# Patient Record
Sex: Female | Born: 1950 | Race: White | Hispanic: No | Marital: Married | Smoking: Former smoker
Health system: Southern US, Community
[De-identification: ages and names within clinical notes are randomized; demographics above are authoritative.]

## PROBLEM LIST (undated history)

## (undated) DIAGNOSIS — M8580 Other specified disorders of bone density and structure, unspecified site: Secondary | ICD-10-CM

## (undated) DIAGNOSIS — M1990 Unspecified osteoarthritis, unspecified site: Secondary | ICD-10-CM

## (undated) DIAGNOSIS — E7800 Pure hypercholesterolemia, unspecified: Secondary | ICD-10-CM

## (undated) DIAGNOSIS — G4730 Sleep apnea, unspecified: Secondary | ICD-10-CM

## (undated) DIAGNOSIS — J189 Pneumonia, unspecified organism: Secondary | ICD-10-CM

## (undated) DIAGNOSIS — R4701 Aphasia: Secondary | ICD-10-CM

## (undated) DIAGNOSIS — G5600 Carpal tunnel syndrome, unspecified upper limb: Secondary | ICD-10-CM

## (undated) DIAGNOSIS — T8859XA Other complications of anesthesia, initial encounter: Secondary | ICD-10-CM

## (undated) DIAGNOSIS — M549 Dorsalgia, unspecified: Secondary | ICD-10-CM

## (undated) DIAGNOSIS — E039 Hypothyroidism, unspecified: Secondary | ICD-10-CM

## (undated) DIAGNOSIS — R252 Cramp and spasm: Secondary | ICD-10-CM

## (undated) DIAGNOSIS — F32A Depression, unspecified: Secondary | ICD-10-CM

## (undated) DIAGNOSIS — C801 Malignant (primary) neoplasm, unspecified: Secondary | ICD-10-CM

## (undated) DIAGNOSIS — K5190 Ulcerative colitis, unspecified, without complications: Secondary | ICD-10-CM

## (undated) DIAGNOSIS — G8929 Other chronic pain: Secondary | ICD-10-CM

## (undated) DIAGNOSIS — T4145XA Adverse effect of unspecified anesthetic, initial encounter: Secondary | ICD-10-CM

## (undated) HISTORY — PX: JOINT REPLACEMENT: SHX530

## (undated) HISTORY — PX: COLONOSCOPY W/ BIOPSIES AND POLYPECTOMY: SHX1376

## (undated) HISTORY — PX: TRIGGER FINGER RELEASE: SHX641

## (undated) HISTORY — DX: Other specified disorders of bone density and structure, unspecified site: M85.80

## (undated) HISTORY — DX: Cramp and spasm: R25.2

## (undated) HISTORY — DX: Sleep apnea, unspecified: G47.30

## (undated) HISTORY — PX: CHOLECYSTECTOMY: SHX55

## (undated) HISTORY — DX: Depression, unspecified: F32.A

## (undated) HISTORY — PX: BILATERAL CARPAL TUNNEL RELEASE: SHX6508

## (undated) HISTORY — PX: BREAST SURGERY: SHX581

---

## 1999-05-07 ENCOUNTER — Ambulatory Visit (HOSPITAL_COMMUNITY): Admission: RE | Admit: 1999-05-07 | Discharge: 1999-05-07 | Payer: Self-pay | Admitting: Gastroenterology

## 1999-05-07 ENCOUNTER — Encounter (INDEPENDENT_AMBULATORY_CARE_PROVIDER_SITE_OTHER): Payer: Self-pay | Admitting: Specialist

## 2000-02-29 ENCOUNTER — Other Ambulatory Visit: Admission: RE | Admit: 2000-02-29 | Discharge: 2000-02-29 | Payer: Self-pay | Admitting: Obstetrics and Gynecology

## 2000-02-29 ENCOUNTER — Encounter (INDEPENDENT_AMBULATORY_CARE_PROVIDER_SITE_OTHER): Payer: Self-pay

## 2002-12-07 ENCOUNTER — Encounter (INDEPENDENT_AMBULATORY_CARE_PROVIDER_SITE_OTHER): Payer: Self-pay | Admitting: Specialist

## 2002-12-07 ENCOUNTER — Ambulatory Visit (HOSPITAL_COMMUNITY): Admission: RE | Admit: 2002-12-07 | Discharge: 2002-12-07 | Payer: Self-pay | Admitting: Gastroenterology

## 2007-10-26 ENCOUNTER — Encounter
Admission: RE | Admit: 2007-10-26 | Discharge: 2007-10-26 | Payer: Self-pay | Admitting: Physical Medicine and Rehabilitation

## 2008-07-18 ENCOUNTER — Encounter: Admission: RE | Admit: 2008-07-18 | Discharge: 2008-07-18 | Payer: Self-pay | Admitting: Orthopedic Surgery

## 2008-10-30 ENCOUNTER — Encounter: Admission: RE | Admit: 2008-10-30 | Discharge: 2008-10-30 | Payer: Self-pay | Admitting: Orthopedic Surgery

## 2009-02-15 HISTORY — PX: BREAST LUMPECTOMY: SHX2

## 2009-02-15 HISTORY — PX: BREAST BIOPSY: SHX20

## 2009-03-13 ENCOUNTER — Inpatient Hospital Stay (HOSPITAL_COMMUNITY): Admission: RE | Admit: 2009-03-13 | Discharge: 2009-03-16 | Payer: Self-pay | Admitting: Orthopedic Surgery

## 2010-05-04 LAB — COMPREHENSIVE METABOLIC PANEL
Alkaline Phosphatase: 46 U/L (ref 39–117)
BUN: 12 mg/dL (ref 6–23)
CO2: 30 mEq/L (ref 19–32)
GFR calc Af Amer: >60 mL/min (ref >60)
GFR calc non Af Amer: >60 mL/min (ref >60)
Glucose, Bld: 58 mg/dL — ABNORMAL LOW (ref 70–99)
Potassium: 4.6 mEq/L (ref 3.5–5.1)
Total Protein: 7 g/dL (ref 6.0–8.3)

## 2010-05-04 LAB — BASIC METABOLIC PANEL
CO2: 28 mEq/L (ref 19–32)
Calcium: 8.5 mg/dL (ref 8.4–10.5)
Chloride: 106 mEq/L (ref 96–112)
Chloride: 107 mEq/L (ref 96–112)
Creatinine, Ser: 0.75 mg/dL (ref 0.4–1.2)
Creatinine, Ser: 0.76 mg/dL (ref 0.4–1.2)
GFR calc Af Amer: >60 mL/min (ref >60)
GFR calc Af Amer: >60 mL/min (ref >60)
Potassium: 4.2 mEq/L (ref 3.5–5.1)
Potassium: 4.6 mEq/L (ref 3.5–5.1)
Sodium: 139 mEq/L (ref 135–145)

## 2010-05-04 LAB — CBC
HCT: 24.8 % — ABNORMAL LOW (ref 36.0–46.0)
HCT: 31.8 % — ABNORMAL LOW (ref 36.0–46.0)
HCT: 32.2 % — ABNORMAL LOW (ref 36.0–46.0)
HCT: 38.4 % (ref 36.0–46.0)
Hemoglobin: 10.6 g/dL — ABNORMAL LOW (ref 12.0–15.0)
Hemoglobin: 12.7 g/dL (ref 12.0–15.0)
Hemoglobin: 8 g/dL — ABNORMAL LOW (ref 12.0–15.0)
MCHC: 33.1 g/dL (ref 30.0–36.0)
MCV: 98.5 fL (ref 78.0–100.0)
Platelets: 147 10*3/uL — ABNORMAL LOW (ref 150–400)
Platelets: 166 10*3/uL (ref 150–400)
RBC: 3.27 MIL/uL — ABNORMAL LOW (ref 3.87–5.11)
RBC: 3.76 MIL/uL — ABNORMAL LOW (ref 3.87–5.11)
RDW: 13 % (ref 11.5–15.5)
RDW: 13.1 % (ref 11.5–15.5)
RDW: 14.5 % (ref 11.5–15.5)
WBC: 4.6 10*3/uL (ref 4.0–10.5)
WBC: 5.6 10*3/uL (ref 4.0–10.5)
WBC: 6.4 10*3/uL (ref 4.0–10.5)

## 2010-05-04 LAB — URINALYSIS, ROUTINE W REFLEX MICROSCOPIC
Bilirubin Urine: NEGATIVE
Hgb urine dipstick: NEGATIVE
pH: 6.5 (ref 5.0–8.0)

## 2010-05-04 LAB — DIFFERENTIAL
Basophils Absolute: 0 10*3/uL (ref 0.0–0.1)
Basophils Relative: 1 % (ref 0–1)
Eosinophils Absolute: 0.1 10*3/uL (ref 0.0–0.7)
Lymphocytes Relative: 44 % (ref 12–46)
Monocytes Absolute: 0.3 10*3/uL (ref 0.1–1.0)
Monocytes Relative: 9 % (ref 3–12)
Neutro Abs: 1.4 10*3/uL — ABNORMAL LOW (ref 1.7–7.7)
Neutrophils Relative %: 44 % (ref 43–77)

## 2010-05-04 LAB — CROSSMATCH

## 2010-05-04 LAB — PROTIME-INR: Prothrombin Time: 13.2 seconds (ref 11.6–15.2)

## 2010-07-03 NOTE — Op Note (Signed)
   NAME:  Stephanie Ramsey, Stephanie Ramsey                             ACCOUNT NO.:  [id number]   MEDICAL RECORD NO.:  [id number]                   PATIENT TYPE:  AMB   LOCATION:  ENDO                                 FACILITY:  [facility]   PHYSICIAN:  Earle Gell, M.D.                DATE OF BIRTH:  12-Feb-1951   DATE OF PROCEDURE:  12/07/2002  DATE OF DISCHARGE:                                 OPERATIVE REPORT   PROCEDURE:  Surveillance colonoscopy.   PROCEDURE INDICATION:  [name] is a 60 year old female born 1950/08/20.  [name] was diagnosed with left-sided ulcerative colitis at age  58.  She is scheduled for screening colonoscopy with polypectomy and random  colonic biopsies to rule out mucosal dysplasia.  Symptomatically her colitis  is in remission.   ENDOSCOPIST:  Earle Gell, M.D.   PREMEDICATION:  Versed 5 mg, Demerol 50 mg.   DESCRIPTION OF PROCEDURE:  After obtaining informed consent, [name] was  placed in the left lateral decubitus position.  I administered intravenous  Demerol and intravenous Versed to achieve conscious sedation for the  procedure.  The patient's blood pressure, oxygen saturation, and cardiac  rhythm were monitored throughout the procedure and documented in the medical  record.   Anal inspection was normal.  Digital rectal exam was normal.  The Olympus  pediatric colonoscope was introduced into the rectum and advanced to the  cecum.  Colonic preparation for the exam today was excellent.   Rectum normal.   Sigmoid colon and descending colon normal.   Splenic flexure normal.   Transverse colon normal.   Hepatic flexure normal.   Ascending colon normal.   Cecum and ileocecal valve normal.   Biopsies:  Four-quadrant colonic mucosal biopsies were taken every 10 cm  beginning in the cecum.  Eight biopsies were placed in each bottle.  Four  bottles were submitted for pathologic evaluation.  Thirty-two biopsies were  performed.   ASSESSMENT:   Normal proctocolonoscopy to the cecum.  Random colonic biopsies  are pending.                                               Earle Gell, M.D.    MJ/MEDQ  D:  12/07/2002  T:  12/07/2002  Job:  [id number]

## 2011-01-18 IMAGING — CR DG HIP 1V PORT*R*
2 series · 2 of 2 positions shown · non-contrast
Comparison: MRI 10/26/2007

CLINICAL DATA: Right hip replacement.

PORTABLE RIGHT HIP - 1 VIEW

[view not recorded (1 of 2)]
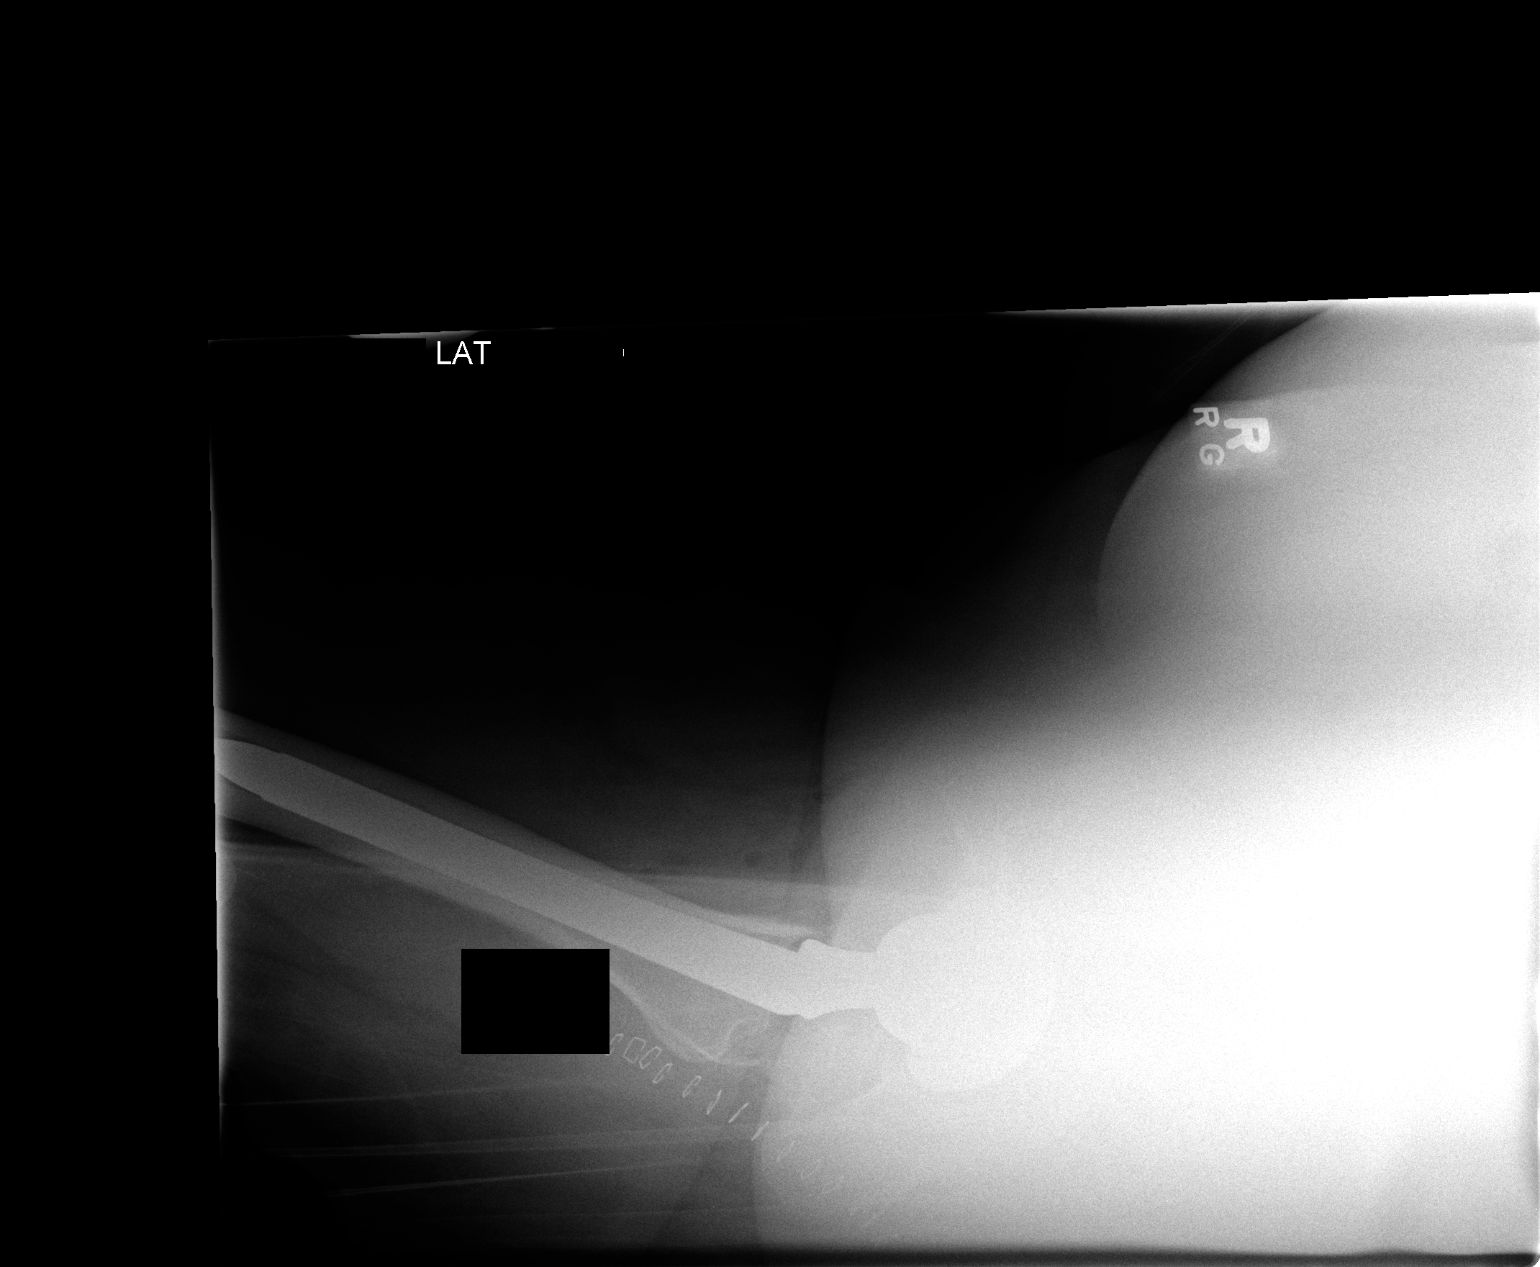

[view not recorded (2 of 2)]
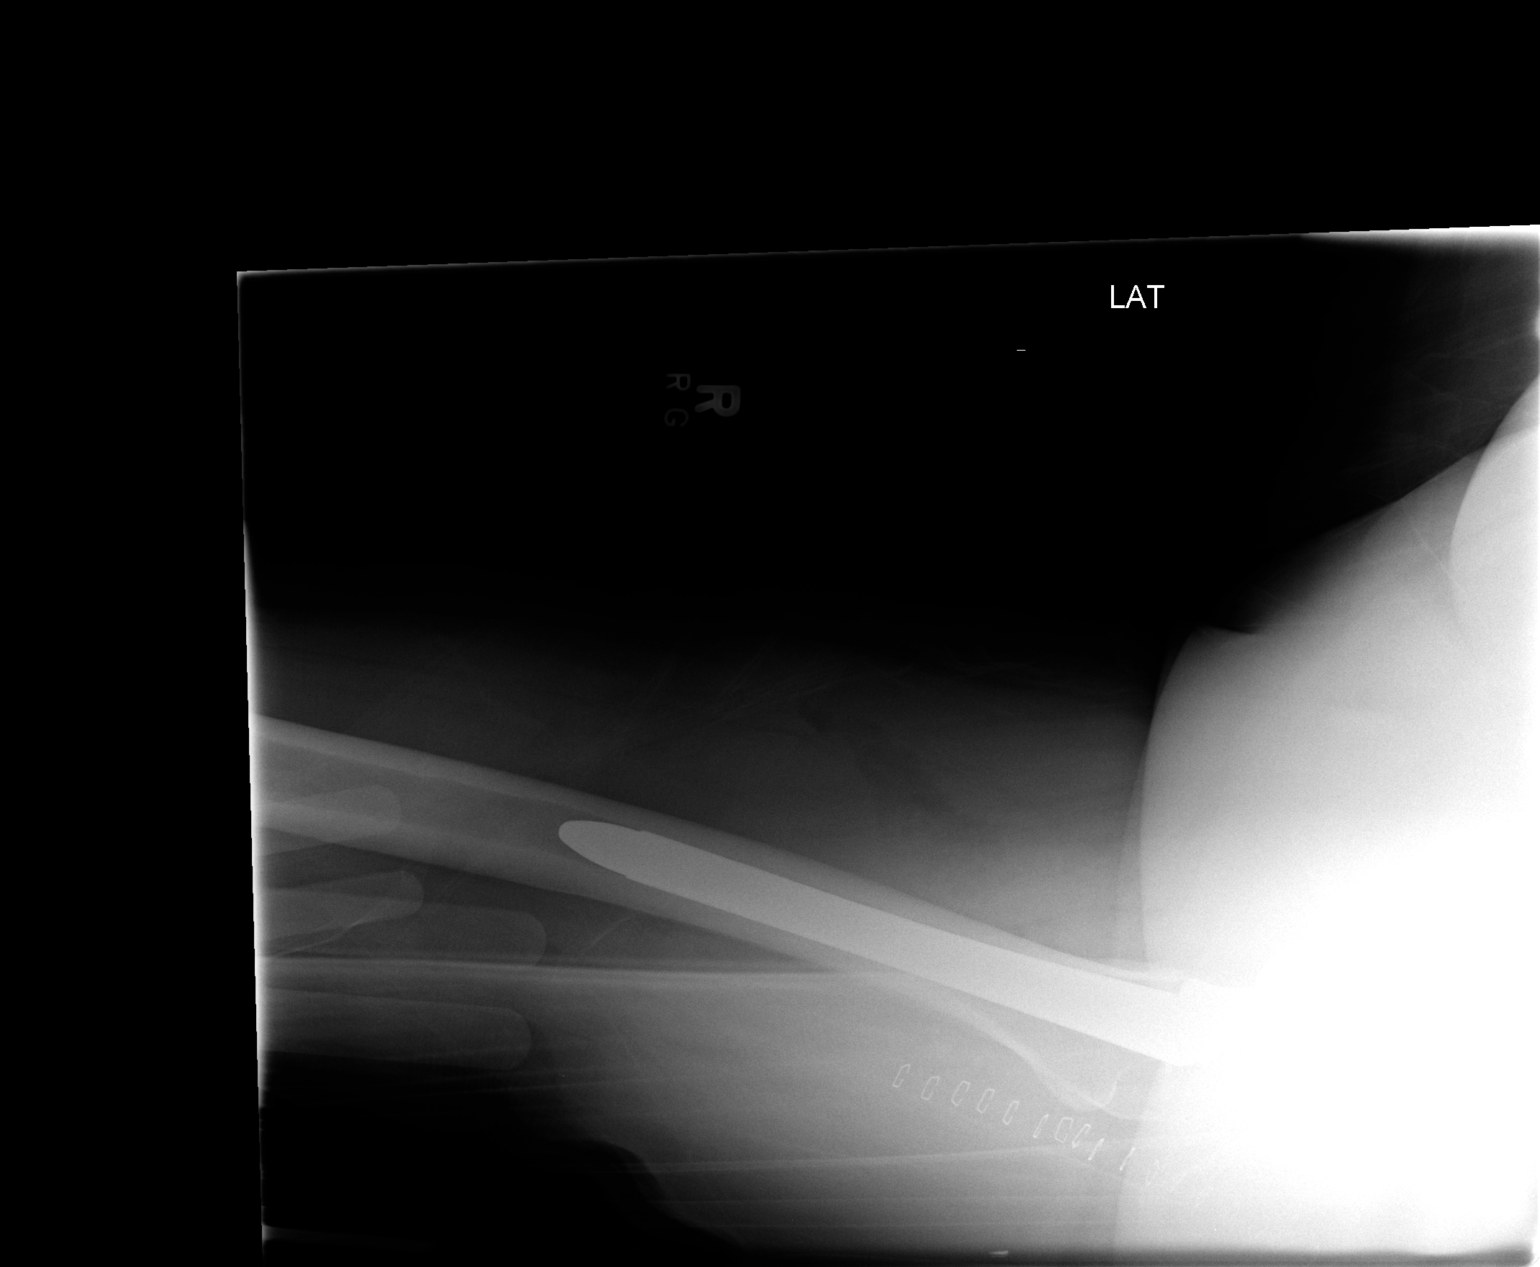

[2 of 2 positions shown; findings below may reference images not displayed]

FINDINGS: Changes of right hip replacement noted.  Normal
alignment.  No complicating feature.
IMPRESSION: Right hip replacement without complicating feature.

## 2011-10-06 ENCOUNTER — Other Ambulatory Visit: Payer: Self-pay | Admitting: Orthopaedic Surgery

## 2011-10-06 DIAGNOSIS — M545 Low back pain, unspecified: Secondary | ICD-10-CM

## 2011-11-06 ENCOUNTER — Ambulatory Visit
Admission: RE | Admit: 2011-11-06 | Discharge: 2011-11-06 | Disposition: A | Discharge status: Home / Self Care | Payer: BC Managed Care – PPO | Source: Ambulatory Visit | Attending: Orthopaedic Surgery | Admitting: Orthopaedic Surgery

## 2011-11-06 DIAGNOSIS — M545 Low back pain, unspecified: Secondary | ICD-10-CM

## 2011-12-15 ENCOUNTER — Ambulatory Visit: Payer: BC Managed Care – PPO | Attending: Orthopaedic Surgery | Admitting: Rehabilitation

## 2011-12-15 DIAGNOSIS — M549 Dorsalgia, unspecified: Secondary | ICD-10-CM | POA: Insufficient documentation

## 2011-12-15 DIAGNOSIS — IMO0001 Reserved for inherently not codable concepts without codable children: Secondary | ICD-10-CM | POA: Insufficient documentation

## 2011-12-22 ENCOUNTER — Ambulatory Visit: Payer: BC Managed Care – PPO | Attending: Orthopaedic Surgery | Admitting: Rehabilitation

## 2011-12-22 DIAGNOSIS — IMO0001 Reserved for inherently not codable concepts without codable children: Secondary | ICD-10-CM | POA: Insufficient documentation

## 2011-12-22 DIAGNOSIS — M549 Dorsalgia, unspecified: Secondary | ICD-10-CM | POA: Insufficient documentation

## 2011-12-29 ENCOUNTER — Encounter: Payer: BC Managed Care – PPO | Admitting: Rehabilitation

## 2011-12-30 ENCOUNTER — Encounter: Payer: BC Managed Care – PPO | Admitting: Rehabilitation

## 2012-01-05 ENCOUNTER — Ambulatory Visit: Payer: BC Managed Care – PPO | Admitting: Rehabilitation

## 2012-01-12 ENCOUNTER — Ambulatory Visit: Payer: BC Managed Care – PPO | Admitting: Rehabilitation

## 2013-07-10 ENCOUNTER — Other Ambulatory Visit (HOSPITAL_COMMUNITY): Payer: Self-pay | Admitting: Orthopaedic Surgery

## 2013-07-10 DIAGNOSIS — R52 Pain, unspecified: Secondary | ICD-10-CM

## 2013-07-17 ENCOUNTER — Ambulatory Visit (HOSPITAL_COMMUNITY): Admission: RE | Admit: 2013-07-17 | Payer: BC Managed Care – PPO | Source: Ambulatory Visit

## 2013-12-17 ENCOUNTER — Encounter (HOSPITAL_COMMUNITY): Payer: Self-pay

## 2013-12-21 ENCOUNTER — Encounter (HOSPITAL_COMMUNITY): Payer: Self-pay

## 2013-12-21 ENCOUNTER — Encounter (HOSPITAL_COMMUNITY)
Admission: RE | Admit: 2013-12-21 | Discharge: 2013-12-21 | Disposition: A | Discharge status: Home / Self Care | Payer: BC Managed Care – PPO | Source: Ambulatory Visit | Attending: Orthopaedic Surgery | Admitting: Orthopaedic Surgery

## 2013-12-21 ENCOUNTER — Ambulatory Visit (HOSPITAL_COMMUNITY)
Admission: RE | Admit: 2013-12-21 | Discharge: 2013-12-21 | Disposition: A | Discharge status: Home / Self Care | Payer: BC Managed Care – PPO | Source: Ambulatory Visit | Attending: Orthopedic Surgery | Admitting: Orthopedic Surgery

## 2013-12-21 DIAGNOSIS — Z01811 Encounter for preprocedural respiratory examination: Secondary | ICD-10-CM | POA: Insufficient documentation

## 2013-12-21 DIAGNOSIS — Z01812 Encounter for preprocedural laboratory examination: Secondary | ICD-10-CM | POA: Diagnosis present

## 2013-12-21 DIAGNOSIS — Z87891 Personal history of nicotine dependence: Secondary | ICD-10-CM | POA: Diagnosis not present

## 2013-12-21 DIAGNOSIS — Z853 Personal history of malignant neoplasm of breast: Secondary | ICD-10-CM | POA: Insufficient documentation

## 2013-12-21 HISTORY — DX: Malignant (primary) neoplasm, unspecified: C80.1

## 2013-12-21 HISTORY — DX: Adverse effect of unspecified anesthetic, initial encounter: T41.45XA

## 2013-12-21 HISTORY — DX: Other complications of anesthesia, initial encounter: T88.59XA

## 2013-12-21 HISTORY — DX: Dorsalgia, unspecified: M54.9

## 2013-12-21 HISTORY — DX: Other chronic pain: G89.29

## 2013-12-21 HISTORY — DX: Pure hypercholesterolemia, unspecified: E78.00

## 2013-12-21 HISTORY — DX: Unspecified osteoarthritis, unspecified site: M19.90

## 2013-12-21 HISTORY — DX: Hypothyroidism, unspecified: E03.9

## 2013-12-21 HISTORY — DX: Ulcerative colitis, unspecified, without complications: K51.90

## 2013-12-21 HISTORY — DX: Carpal tunnel syndrome, unspecified upper limb: G56.00

## 2013-12-21 HISTORY — DX: Pneumonia, unspecified organism: J18.9

## 2013-12-21 LAB — CBC WITH DIFFERENTIAL/PLATELET
BASOS ABS: 0 10*3/uL (ref 0.0–0.1)
Basophils Relative: 1 % (ref 0–1)
EOS ABS: 0 10*3/uL (ref 0.0–0.7)
EOS PCT: 1 % (ref 0–5)
HCT: 35.8 % — ABNORMAL LOW (ref 36.0–46.0)
Hemoglobin: 11.8 g/dL — ABNORMAL LOW (ref 12.0–15.0)
LYMPHS ABS: 2.4 10*3/uL (ref 0.7–4.0)
Lymphocytes Relative: 37 % (ref 12–46)
MCH: 32.8 pg (ref 26.0–34.0)
MCHC: 33 g/dL (ref 30.0–36.0)
MCV: 99.4 fL (ref 78.0–100.0)
Monocytes Absolute: 0.6 10*3/uL (ref 0.1–1.0)
Monocytes Relative: 10 % (ref 3–12)
Neutro Abs: 3.3 10*3/uL (ref 1.7–7.7)
Neutrophils Relative %: 51 % (ref 43–77)
Platelets: 270 10*3/uL (ref 150–400)
RBC: 3.6 MIL/uL — ABNORMAL LOW (ref 3.87–5.11)
RDW: 13.1 % (ref 11.5–15.5)
WBC: 6.4 10*3/uL (ref 4.0–10.5)

## 2013-12-21 LAB — URINALYSIS, ROUTINE W REFLEX MICROSCOPIC
BILIRUBIN URINE: NEGATIVE
GLUCOSE, UA: NEGATIVE mg/dL
Hgb urine dipstick: NEGATIVE
Ketones, ur: NEGATIVE mg/dL
LEUKOCYTES UA: NEGATIVE
Nitrite: NEGATIVE
PH: 6 (ref 5.0–8.0)
Protein, ur: NEGATIVE mg/dL
SPECIFIC GRAVITY, URINE: 1.014 (ref 1.005–1.030)
Urobilinogen, UA: 0.2 mg/dL (ref 0.0–1.0)

## 2013-12-21 LAB — SURGICAL PCR SCREEN
MRSA, PCR: NEGATIVE
Staphylococcus aureus: NEGATIVE

## 2013-12-21 LAB — APTT: aPTT: 32 seconds (ref 24–37)

## 2013-12-21 LAB — COMPREHENSIVE METABOLIC PANEL
ALT: 57 U/L — ABNORMAL HIGH (ref 0–35)
AST: 34 U/L (ref 0–37)
Albumin: 3.6 g/dL (ref 3.5–5.2)
Alkaline Phosphatase: 81 U/L (ref 39–117)
Anion gap: 12 (ref 5–15)
BUN: 12 mg/dL (ref 6–23)
CALCIUM: 9.7 mg/dL (ref 8.4–10.5)
CO2: 30 meq/L (ref 19–32)
CREATININE: 0.66 mg/dL (ref 0.50–1.10)
Chloride: 100 mEq/L (ref 96–112)
GLUCOSE: 100 mg/dL — AB (ref 70–99)
Potassium: 3.4 mEq/L — ABNORMAL LOW (ref 3.7–5.3)
Sodium: 142 mEq/L (ref 137–147)
Total Bilirubin: 0.3 mg/dL (ref 0.3–1.2)
Total Protein: 7.1 g/dL (ref 6.0–8.3)

## 2013-12-21 LAB — TYPE AND SCREEN
ABO/RH(D): A POS
Antibody Screen: NEGATIVE

## 2013-12-21 LAB — PROTIME-INR
INR: 1.03 (ref 0.00–1.49)
PROTHROMBIN TIME: 13.6 s (ref 11.6–15.2)

## 2013-12-21 LAB — ABO/RH: ABO/RH(D): A POS

## 2013-12-21 NOTE — Pre-Procedure Instructions (Signed)
Stephanie Ramsey  12/21/2013   Your procedure is scheduled on: Tuesday, January 01, 2014  Report to [facility] Admitting at 5:30 AM.  Call this number if you have problems the morning of surgery: [phone]   Remember:   Do not eat food or drink liquids after midnight Monday, December 31, 2013   Take these medicines the morning of surgery with A SIP OF WATER: levothyroxine (SYNTHROID),  If needed: HYDROcodone-acetaminophen (VICODIN)  for pain.  Stop taking Aspirin, vitamins, and herbal medications such as  (Cinnamon, GLUCOSAMINE-CHONDROITIN, Omega-3 Fatty Acids (FISH OIL), TURMERIC, and  Ubiquinol. Do not take any NSAIDs ie: Ibuprofen, Advil, Naproxen or any medication containing Aspirin ;stop 5 days prior to procedure (Thursday, December 27, 2013 )   Do not wear jewelry, make-up or nail polish.  Do not wear lotions, powders, or perfumes. You may not wear deodorant.  Do not shave 48 hours prior to surgery.   Do not bring valuables to the hospital.  [facility] is not responsible for any belongings or valuables.               Contacts, dentures or bridgework may not be worn into surgery.  Leave suitcase in the car. After surgery it may be brought to your room.  For patients admitted to the hospital, discharge time is determined by your treatment team.               Patients discharged the day of surgery will not be allowed to drive home.  Name and phone number of your driver:   Special Instructions:  Special Instructions:Special Instructions: [facility] - Preparing for Surgery  Before surgery, you can play an important role.  Because skin is not sterile, your skin needs to be as free of germs as possible.  You can reduce the number of germs on you skin by washing with CHG (chlorahexidine gluconate) soap before surgery.  CHG is an antiseptic cleaner which kills germs and bonds with the skin to continue killing germs even after washing.  Please DO NOT use if you have an allergy  to CHG or antibacterial soaps.  If your skin becomes reddened/irritated stop using the CHG and inform your nurse when you arrive at Short Stay.  Do not shave (including legs and underarms) for at least 48 hours prior to the first CHG shower.  You may shave your face.  Please follow these instructions carefully:   1.  Shower with CHG Soap the night before surgery and the morning of Surgery.  2.  If you choose to wash your hair, wash your hair first as usual with your normal shampoo.  3.  After you shampoo, rinse your hair and body thoroughly to remove the Shampoo.  4.  Use CHG as you would any other liquid soap.  You can apply chg directly  to the skin and wash gently with scrungie or a clean washcloth.  5.  Apply the CHG Soap to your body ONLY FROM THE NECK DOWN.  Do not use on open wounds or open sores.  Avoid contact with your eyes, ears, mouth and genitals (private parts).  Wash genitals (private parts) with your normal soap.  6.  Wash thoroughly, paying special attention to the area where your surgery will be performed.  7.  Thoroughly rinse your body with warm water from the neck down.  8.  DO NOT shower/wash with your normal soap after using and rinsing off the CHG Soap.  9.  Pat yourself dry with a clean towel.            10.  Wear clean pajamas.            11.  Place clean sheets on your bed the night of your first shower and do not sleep with pets.  Day of Surgery  Do not apply any lotions/deodorants the morning of surgery.  Please wear clean clothes to the hospital/surgery center.   Please read over the following fact sheets that you were given: Pain Booklet, Coughing and Deep Breathing, Blood Transfusion Information, Total Joint Packet, MRSA Information and Surgical Site Infection Prevention

## 2013-12-24 LAB — URINE CULTURE
COLONY COUNT: NO GROWTH
CULTURE: NO GROWTH

## 2013-12-24 NOTE — Progress Notes (Signed)
Anesthesia Chart Review:  Pt is 63 year old female scheduled for L total hip arthroplasty on 01/01/2014 with Dr. Durward Fortes.   PMH: hypercholesterolemia, hypothyroidism, ulcerative colitis, breast cancer. Former smoker. Pt has hx of anesthesia complication of stopping breathing during procedure and dizziness after procedures.   Medications include: ASA, simvastatin, levothyroxine  Preoperative labs reviewed.  K 3.4.  ALT 57.  EKG from PCP's office 11/27/2013 is on chart. Showed NSR.   Is medically cleared by pcp, Dr. Charlcie Cradle with [facility]. See note on chart.   Pt had R hip replacement 03/13/2009 at [facility] request for records. Assigned anesthesiologist can review DOS.   Willeen Cass, FNP-BC [facility]/Anesthesiology Phone: [phone] 12/24/2013 2:56 PM

## 2013-12-31 MED ORDER — ACETAMINOPHEN 10 MG/ML IV SOLN
1000.0000 mg | Freq: Four times a day (QID) | INTRAVENOUS | Status: DC
  Administered 2014-01-01: 1000 mg via INTRAVENOUS

## 2013-12-31 MED ORDER — CEFAZOLIN SODIUM-DEXTROSE 2-3 GM-% IV SOLR
2.0000 g | INTRAVENOUS | Status: AC
  Administered 2014-01-01: 2 g via INTRAVENOUS
  Filled 2013-12-31: qty 50

## 2013-12-31 NOTE — Anesthesia Preprocedure Evaluation (Addendum)
Anesthesia Evaluation  Patient identified by MRN, date of birth, ID band Patient awake    Reviewed: Allergy & Precautions, H&P , NPO status , Patient's Chart, lab work & pertinent test results, reviewed documented beta blocker date and time   Airway Mallampati: II   Neck ROM: Full    Dental  (+) Teeth Intact, Dental Advisory Given   Pulmonary former smoker,  breath sounds clear to auscultation        Cardiovascular Rhythm:Irregular  EKG NSR   Neuro/Psych    GI/Hepatic   Endo/Other    Renal/GU      Musculoskeletal   Abdominal (+)  Abdomen: soft.    Peds  Hematology   Anesthesia Other Findings   Reproductive/Obstetrics                            Anesthesia Physical Anesthesia Plan  ASA: II  Anesthesia Plan: General   Post-op Pain Management:    Induction: Intravenous  Airway Management Planned: Oral ETT  Additional Equipment:   Intra-op Plan:   Post-operative Plan: Extubation in OR  Informed Consent:   Plan Discussed with:   Anesthesia Plan Comments: (Would benefit from multy nodal pain RX)        Anesthesia Quick Evaluation

## 2014-01-01 ENCOUNTER — Inpatient Hospital Stay (HOSPITAL_COMMUNITY): Payer: BC Managed Care – PPO

## 2014-01-01 ENCOUNTER — Inpatient Hospital Stay (HOSPITAL_COMMUNITY): Payer: BC Managed Care – PPO | Admitting: Anesthesiology

## 2014-01-01 ENCOUNTER — Encounter (HOSPITAL_COMMUNITY)
Admission: RE | Disposition: A | Discharge status: Home Health | Payer: Self-pay | Source: Ambulatory Visit | Attending: Orthopaedic Surgery

## 2014-01-01 ENCOUNTER — Inpatient Hospital Stay (HOSPITAL_COMMUNITY): Payer: BC Managed Care – PPO | Admitting: Emergency Medicine

## 2014-01-01 ENCOUNTER — Encounter (HOSPITAL_COMMUNITY): Payer: Self-pay | Admitting: Surgery

## 2014-01-01 ENCOUNTER — Inpatient Hospital Stay (HOSPITAL_COMMUNITY)
Admission: RE | Admit: 2014-01-01 | Discharge: 2014-01-04 | DRG: 470 | Disposition: A | Discharge status: Home Health | Payer: BC Managed Care – PPO | Source: Ambulatory Visit | Attending: Orthopaedic Surgery | Admitting: Orthopaedic Surgery

## 2014-01-01 DIAGNOSIS — Z8261 Family history of arthritis: Secondary | ICD-10-CM | POA: Diagnosis not present

## 2014-01-01 DIAGNOSIS — Z9889 Other specified postprocedural states: Secondary | ICD-10-CM

## 2014-01-01 DIAGNOSIS — M549 Dorsalgia, unspecified: Secondary | ICD-10-CM | POA: Diagnosis present

## 2014-01-01 DIAGNOSIS — R55 Syncope and collapse: Secondary | ICD-10-CM | POA: Diagnosis not present

## 2014-01-01 DIAGNOSIS — M71552 Other bursitis, not elsewhere classified, left hip: Secondary | ICD-10-CM | POA: Diagnosis present

## 2014-01-01 DIAGNOSIS — Z880 Allergy status to penicillin: Secondary | ICD-10-CM

## 2014-01-01 DIAGNOSIS — G8929 Other chronic pain: Secondary | ICD-10-CM | POA: Diagnosis present

## 2014-01-01 DIAGNOSIS — G8918 Other acute postprocedural pain: Secondary | ICD-10-CM | POA: Diagnosis not present

## 2014-01-01 DIAGNOSIS — Z96641 Presence of right artificial hip joint: Secondary | ICD-10-CM | POA: Diagnosis present

## 2014-01-01 DIAGNOSIS — Z87891 Personal history of nicotine dependence: Secondary | ICD-10-CM

## 2014-01-01 DIAGNOSIS — Z79899 Other long term (current) drug therapy: Secondary | ICD-10-CM

## 2014-01-01 DIAGNOSIS — D62 Acute posthemorrhagic anemia: Secondary | ICD-10-CM | POA: Diagnosis not present

## 2014-01-01 DIAGNOSIS — E780 Pure hypercholesterolemia: Secondary | ICD-10-CM | POA: Diagnosis present

## 2014-01-01 DIAGNOSIS — Z8249 Family history of ischemic heart disease and other diseases of the circulatory system: Secondary | ICD-10-CM | POA: Diagnosis not present

## 2014-01-01 DIAGNOSIS — Z7901 Long term (current) use of anticoagulants: Secondary | ICD-10-CM

## 2014-01-01 DIAGNOSIS — Z853 Personal history of malignant neoplasm of breast: Secondary | ICD-10-CM | POA: Diagnosis not present

## 2014-01-01 DIAGNOSIS — M1612 Unilateral primary osteoarthritis, left hip: Principal | ICD-10-CM | POA: Diagnosis present

## 2014-01-01 DIAGNOSIS — E039 Hypothyroidism, unspecified: Secondary | ICD-10-CM | POA: Diagnosis present

## 2014-01-01 DIAGNOSIS — M25552 Pain in left hip: Secondary | ICD-10-CM | POA: Diagnosis present

## 2014-01-01 DIAGNOSIS — Z96649 Presence of unspecified artificial hip joint: Secondary | ICD-10-CM

## 2014-01-01 HISTORY — PX: TOTAL HIP ARTHROPLASTY: SHX124

## 2014-01-01 SURGERY — ARTHROPLASTY, HIP, TOTAL,POSTERIOR APPROACH
Anesthesia: General | Site: Hip | Laterality: Left

## 2014-01-01 MED ORDER — METHOCARBAMOL 1000 MG/10ML IJ SOLN
500.0000 mg | INTRAVENOUS | Status: AC
  Administered 2014-01-01: 500 mg via INTRAVENOUS
  Filled 2014-01-01: qty 5

## 2014-01-01 MED ORDER — VANCOMYCIN HCL IN DEXTROSE 1-5 GM/200ML-% IV SOLN
1000.0000 mg | Freq: Two times a day (BID) | INTRAVENOUS | Status: AC
  Administered 2014-01-01: 1000 mg via INTRAVENOUS
  Filled 2014-01-01: qty 200

## 2014-01-01 MED ORDER — METHOCARBAMOL 1000 MG/10ML IJ SOLN
500.0000 mg | Freq: Four times a day (QID) | INTRAVENOUS | Status: DC | PRN
  Filled 2014-01-01: qty 5

## 2014-01-01 MED ORDER — PROPOFOL 10 MG/ML IV BOLUS
INTRAVENOUS | Status: AC
  Filled 2014-01-01: qty 20

## 2014-01-01 MED ORDER — GLYCOPYRROLATE 0.2 MG/ML IJ SOLN
INTRAMUSCULAR | Status: AC
  Filled 2014-01-01: qty 2

## 2014-01-01 MED ORDER — RIVAROXABAN 10 MG PO TABS
10.0000 mg | ORAL_TABLET | Freq: Every day | ORAL | Status: DC
  Administered 2014-01-01 – 2014-01-03 (×3): 10 mg via ORAL
  Filled 2014-01-01 (×4): qty 1

## 2014-01-01 MED ORDER — NEOSTIGMINE METHYLSULFATE 10 MG/10ML IV SOLN
INTRAVENOUS | Status: DC | PRN
  Administered 2014-01-01: 3 mg via INTRAVENOUS

## 2014-01-01 MED ORDER — NEOSTIGMINE METHYLSULFATE 10 MG/10ML IV SOLN
INTRAVENOUS | Status: AC
  Filled 2014-01-01: qty 1

## 2014-01-01 MED ORDER — BUPIVACAINE-EPINEPHRINE (PF) 0.25% -1:200000 IJ SOLN
INTRAMUSCULAR | Status: DC | PRN
  Administered 2014-01-01: 30 mL via PERINEURAL

## 2014-01-01 MED ORDER — LEVOTHYROXINE SODIUM 150 MCG PO TABS
150.0000 ug | ORAL_TABLET | Freq: Every day | ORAL | Status: DC
  Administered 2014-01-02 – 2014-01-04 (×3): 150 ug via ORAL
  Filled 2014-01-01 (×4): qty 1

## 2014-01-01 MED ORDER — PHENYLEPHRINE 40 MCG/ML (10ML) SYRINGE FOR IV PUSH (FOR BLOOD PRESSURE SUPPORT)
PREFILLED_SYRINGE | INTRAVENOUS | Status: AC
  Filled 2014-01-01: qty 10

## 2014-01-01 MED ORDER — ALUM & MAG HYDROXIDE-SIMETH 200-200-20 MG/5ML PO SUSP: 30.0000 mL | ORAL | Status: DC | PRN

## 2014-01-01 MED ORDER — KETOROLAC TROMETHAMINE 15 MG/ML IJ SOLN
15.0000 mg | Freq: Four times a day (QID) | INTRAMUSCULAR | Status: AC
  Administered 2014-01-01 (×4): 15 mg via INTRAVENOUS
  Filled 2014-01-01 (×2): qty 1

## 2014-01-01 MED ORDER — METOCLOPRAMIDE HCL 10 MG PO TABS: 5.0000 mg | ORAL_TABLET | Freq: Three times a day (TID) | ORAL | Status: DC | PRN

## 2014-01-01 MED ORDER — MIDAZOLAM HCL 2 MG/2ML IJ SOLN
INTRAMUSCULAR | Status: AC
  Filled 2014-01-01: qty 2

## 2014-01-01 MED ORDER — ONDANSETRON HCL 4 MG PO TABS: 4.0000 mg | ORAL_TABLET | Freq: Four times a day (QID) | ORAL | Status: DC | PRN

## 2014-01-01 MED ORDER — METOCLOPRAMIDE HCL 5 MG/ML IJ SOLN: 5.0000 mg | Freq: Three times a day (TID) | INTRAMUSCULAR | Status: DC | PRN

## 2014-01-01 MED ORDER — ONDANSETRON HCL 4 MG/2ML IJ SOLN
INTRAMUSCULAR | Status: DC | PRN
  Administered 2014-01-01: 4 mg via INTRAVENOUS

## 2014-01-01 MED ORDER — MAGNESIUM CITRATE PO SOLN: 1.0000 | Freq: Once | ORAL | Status: AC | PRN

## 2014-01-01 MED ORDER — ONDANSETRON HCL 4 MG/2ML IJ SOLN
INTRAMUSCULAR | Status: AC
  Filled 2014-01-01: qty 2

## 2014-01-01 MED ORDER — SENNOSIDES-DOCUSATE SODIUM 8.6-50 MG PO TABS: 1.0000 | ORAL_TABLET | Freq: Every evening | ORAL | Status: DC | PRN

## 2014-01-01 MED ORDER — CHLORHEXIDINE GLUCONATE 4 % EX LIQD
60.0000 mL | Freq: Every day | CUTANEOUS | Status: DC
  Filled 2014-01-01: qty 60

## 2014-01-01 MED ORDER — FENTANYL CITRATE 0.05 MG/ML IJ SOLN
INTRAMUSCULAR | Status: AC
  Filled 2014-01-01: qty 5

## 2014-01-01 MED ORDER — SODIUM CHLORIDE 0.9 % IV SOLN: INTRAVENOUS | Status: DC

## 2014-01-01 MED ORDER — GLYCOPYRROLATE 0.2 MG/ML IJ SOLN
INTRAMUSCULAR | Status: DC | PRN
  Administered 2014-01-01: 0.4 mg via INTRAVENOUS

## 2014-01-01 MED ORDER — FOLIC ACID 400 MCG PO TABS: 400.0000 ug | ORAL_TABLET | Freq: Every day | ORAL | Status: DC

## 2014-01-01 MED ORDER — FOLIC ACID 0.5 MG HALF TAB
0.5000 mg | ORAL_TABLET | Freq: Every day | ORAL | Status: DC
  Administered 2014-01-01 – 2014-01-04 (×4): 0.5 mg via ORAL
  Filled 2014-01-01 (×4): qty 1

## 2014-01-01 MED ORDER — ROCURONIUM BROMIDE 100 MG/10ML IV SOLN
INTRAVENOUS | Status: DC | PRN
  Administered 2014-01-01: 10 mg via INTRAVENOUS
  Administered 2014-01-01: 40 mg via INTRAVENOUS

## 2014-01-01 MED ORDER — METHOCARBAMOL 500 MG PO TABS
500.0000 mg | ORAL_TABLET | Freq: Four times a day (QID) | ORAL | Status: DC | PRN
  Administered 2014-01-01 – 2014-01-03 (×5): 500 mg via ORAL
  Filled 2014-01-01 (×5): qty 1

## 2014-01-01 MED ORDER — SULFASALAZINE 500 MG PO TBEC
1000.0000 mg | DELAYED_RELEASE_TABLET | Freq: Two times a day (BID) | ORAL | Status: DC
  Administered 2014-01-01 – 2014-01-04 (×7): 1000 mg via ORAL
  Filled 2014-01-01 (×8): qty 2

## 2014-01-01 MED ORDER — OXYCODONE HCL 5 MG PO TABS
5.0000 mg | ORAL_TABLET | ORAL | Status: DC | PRN
  Administered 2014-01-01 – 2014-01-03 (×6): 10 mg via ORAL
  Filled 2014-01-01 (×6): qty 2

## 2014-01-01 MED ORDER — PROPOFOL 10 MG/ML IV BOLUS
INTRAVENOUS | Status: DC | PRN
  Administered 2014-01-01: 20 mg via INTRAVENOUS
  Administered 2014-01-01: 120 mg via INTRAVENOUS

## 2014-01-01 MED ORDER — MENTHOL 3 MG MT LOZG: 1.0000 | LOZENGE | OROMUCOSAL | Status: DC | PRN

## 2014-01-01 MED ORDER — PNEUMOCOCCAL VAC POLYVALENT 25 MCG/0.5ML IJ INJ
0.5000 mL | INJECTION | INTRAMUSCULAR | Status: AC
  Administered 2014-01-02: 0.5 mL via INTRAMUSCULAR
  Filled 2014-01-01 (×2): qty 0.5

## 2014-01-01 MED ORDER — MEPERIDINE HCL 25 MG/ML IJ SOLN: 6.2500 mg | INTRAMUSCULAR | Status: DC | PRN

## 2014-01-01 MED ORDER — BUPIVACAINE-EPINEPHRINE (PF) 0.25% -1:200000 IJ SOLN
INTRAMUSCULAR | Status: AC
  Filled 2014-01-01: qty 30

## 2014-01-01 MED ORDER — FENTANYL CITRATE 0.05 MG/ML IJ SOLN
INTRAMUSCULAR | Status: AC
  Filled 2014-01-01: qty 2

## 2014-01-01 MED ORDER — LIDOCAINE HCL (CARDIAC) 20 MG/ML IV SOLN
INTRAVENOUS | Status: AC
  Filled 2014-01-01: qty 5

## 2014-01-01 MED ORDER — LACTATED RINGERS IV SOLN
INTRAVENOUS | Status: DC | PRN
  Administered 2014-01-01 (×3): via INTRAVENOUS

## 2014-01-01 MED ORDER — PHENYLEPHRINE HCL 10 MG/ML IJ SOLN
INTRAMUSCULAR | Status: DC | PRN
  Administered 2014-01-01 (×7): 80 ug via INTRAVENOUS

## 2014-01-01 MED ORDER — KETOROLAC TROMETHAMINE 15 MG/ML IJ SOLN
INTRAMUSCULAR | Status: AC
  Filled 2014-01-01: qty 1

## 2014-01-01 MED ORDER — PHENOL 1.4 % MT LIQD: 1.0000 | OROMUCOSAL | Status: DC | PRN

## 2014-01-01 MED ORDER — ACETAMINOPHEN 10 MG/ML IV SOLN
INTRAVENOUS | Status: AC
  Filled 2014-01-01: qty 100

## 2014-01-01 MED ORDER — DEXAMETHASONE SODIUM PHOSPHATE 4 MG/ML IJ SOLN
INTRAMUSCULAR | Status: DC | PRN
  Administered 2014-01-01: 8 mg via INTRAVENOUS

## 2014-01-01 MED ORDER — 0.9 % SODIUM CHLORIDE (POUR BTL) OPTIME
TOPICAL | Status: DC | PRN
  Administered 2014-01-01: 1000 mL

## 2014-01-01 MED ORDER — ACETAMINOPHEN 10 MG/ML IV SOLN
1000.0000 mg | Freq: Four times a day (QID) | INTRAVENOUS | Status: AC
  Administered 2014-01-01 – 2014-01-02 (×4): 1000 mg via INTRAVENOUS
  Filled 2014-01-01 (×6): qty 100

## 2014-01-01 MED ORDER — SODIUM CHLORIDE 0.9 % IJ SOLN
INTRAMUSCULAR | Status: AC
  Filled 2014-01-01: qty 10

## 2014-01-01 MED ORDER — LIDOCAINE HCL (CARDIAC) 20 MG/ML IV SOLN
INTRAVENOUS | Status: DC | PRN
  Administered 2014-01-01: 100 mg via INTRAVENOUS

## 2014-01-01 MED ORDER — FENTANYL CITRATE 0.05 MG/ML IJ SOLN
25.0000 ug | INTRAMUSCULAR | Status: DC | PRN
  Administered 2014-01-01 (×4): 25 ug via INTRAVENOUS

## 2014-01-01 MED ORDER — CHLORHEXIDINE GLUCONATE 4 % EX LIQD
60.0000 mL | Freq: Once | CUTANEOUS | Status: DC
  Filled 2014-01-01: qty 60

## 2014-01-01 MED ORDER — DOCUSATE SODIUM 100 MG PO CAPS
100.0000 mg | ORAL_CAPSULE | Freq: Two times a day (BID) | ORAL | Status: DC
  Administered 2014-01-01 – 2014-01-04 (×7): 100 mg via ORAL
  Filled 2014-01-01 (×7): qty 1

## 2014-01-01 MED ORDER — ONDANSETRON HCL 4 MG/2ML IJ SOLN: 4.0000 mg | Freq: Four times a day (QID) | INTRAMUSCULAR | Status: DC | PRN

## 2014-01-01 MED ORDER — EPHEDRINE SULFATE 50 MG/ML IJ SOLN
INTRAMUSCULAR | Status: DC | PRN
  Administered 2014-01-01 (×2): 5 mg via INTRAVENOUS
  Administered 2014-01-01: 10 mg via INTRAVENOUS

## 2014-01-01 MED ORDER — BISACODYL 5 MG PO TBEC: 5.0000 mg | DELAYED_RELEASE_TABLET | Freq: Every day | ORAL | Status: DC | PRN

## 2014-01-01 MED ORDER — DEXAMETHASONE SODIUM PHOSPHATE 4 MG/ML IJ SOLN
INTRAMUSCULAR | Status: AC
  Filled 2014-01-01: qty 2

## 2014-01-01 MED ORDER — MIDAZOLAM HCL 5 MG/5ML IJ SOLN
INTRAMUSCULAR | Status: DC | PRN
  Administered 2014-01-01: 1 mg via INTRAVENOUS

## 2014-01-01 MED ORDER — EPHEDRINE SULFATE 50 MG/ML IJ SOLN
INTRAMUSCULAR | Status: AC
  Filled 2014-01-01: qty 1

## 2014-01-01 MED ORDER — FENTANYL CITRATE 0.05 MG/ML IJ SOLN
INTRAMUSCULAR | Status: DC | PRN
  Administered 2014-01-01 (×2): 50 ug via INTRAVENOUS
  Administered 2014-01-01: 150 ug via INTRAVENOUS

## 2014-01-01 MED ORDER — KETOROLAC TROMETHAMINE 15 MG/ML IJ SOLN: 7.5000 mg | Freq: Four times a day (QID) | INTRAMUSCULAR | Status: DC

## 2014-01-01 MED ORDER — SODIUM CHLORIDE 0.9 % IV SOLN
75.0000 mL/h | INTRAVENOUS | Status: DC
  Administered 2014-01-01: 75 mL/h via INTRAVENOUS

## 2014-01-01 MED ORDER — ROCURONIUM BROMIDE 50 MG/5ML IV SOLN
INTRAVENOUS | Status: AC
  Filled 2014-01-01: qty 1

## 2014-01-01 MED ORDER — HYDROMORPHONE HCL 1 MG/ML IJ SOLN
1.0000 mg | INTRAMUSCULAR | Status: DC | PRN
  Administered 2014-01-01 (×2): 1 mg via INTRAVENOUS
  Filled 2014-01-01 (×2): qty 1

## 2014-01-01 MED ORDER — PROMETHAZINE HCL 25 MG/ML IJ SOLN: 6.2500 mg | INTRAMUSCULAR | Status: DC | PRN

## 2014-01-01 SURGICAL SUPPLY — 64 items
BLADE SAW SAG 73X25 THK (BLADE) ×1
BLADE SAW SGTL 73X25 THK (BLADE) ×1 IMPLANT
BRUSH FEMORAL CANAL (MISCELLANEOUS) IMPLANT
CAPT HIP PF MOP ×2 IMPLANT
COVER BACK TABLE 24X17X13 BIG (DRAPES) ×2 IMPLANT
COVER SURGICAL LIGHT HANDLE (MISCELLANEOUS) ×2 IMPLANT
DRAPE IMP U-DRAPE 54X76 (DRAPES) ×2 IMPLANT
DRAPE INCISE IOBAN 66X45 STRL (DRAPES) IMPLANT
DRAPE ORTHO SPLIT 77X108 STRL (DRAPES) ×2
DRAPE SURG ORHT 6 SPLT 77X108 (DRAPES) ×2 IMPLANT
DRSG MEPILEX BORDER 4X12 (GAUZE/BANDAGES/DRESSINGS) ×2 IMPLANT
DURAPREP 26ML APPLICATOR (WOUND CARE) ×4 IMPLANT
ELECT BLADE 6.5 EXT (BLADE) IMPLANT
ELECT CAUTERY BLADE 6.4 (BLADE) ×2 IMPLANT
ELECT REM PT RETURN 9FT ADLT (ELECTROSURGICAL) ×2
ELECTRODE REM PT RTRN 9FT ADLT (ELECTROSURGICAL) ×1 IMPLANT
EVACUATOR 1/8 PVC DRAIN (DRAIN) IMPLANT
FACESHIELD WRAPAROUND (MASK) ×6 IMPLANT
GLOVE BIOGEL PI IND STRL 7.0 (GLOVE) ×4 IMPLANT
GLOVE BIOGEL PI IND STRL 8 (GLOVE) ×2 IMPLANT
GLOVE BIOGEL PI IND STRL 8.5 (GLOVE) ×1 IMPLANT
GLOVE BIOGEL PI INDICATOR 7.0 (GLOVE) ×4
GLOVE BIOGEL PI INDICATOR 8 (GLOVE) ×2
GLOVE BIOGEL PI INDICATOR 8.5 (GLOVE) ×1
GLOVE ECLIPSE 6.5 STRL STRAW (GLOVE) ×8 IMPLANT
GLOVE ECLIPSE 8.0 STRL XLNG CF (GLOVE) ×2 IMPLANT
GLOVE SURG ORTHO 8.5 STRL (GLOVE) ×4 IMPLANT
GLOVE SURG SS PI 7.0 STRL IVOR (GLOVE) ×2 IMPLANT
GOWN STRL REUS W/ TWL LRG LVL3 (GOWN DISPOSABLE) ×2 IMPLANT
GOWN STRL REUS W/TWL 2XL LVL3 (GOWN DISPOSABLE) ×4 IMPLANT
GOWN STRL REUS W/TWL LRG LVL3 (GOWN DISPOSABLE) ×2
HANDPIECE INTERPULSE COAX TIP (DISPOSABLE)
IMMOBILIZER KNEE 20 (SOFTGOODS) IMPLANT
IMMOBILIZER KNEE 22 UNIV (SOFTGOODS) ×2 IMPLANT
IMMOBILIZER KNEE 24 THIGH 36 (MISCELLANEOUS) IMPLANT
IMMOBILIZER KNEE 24 UNIV (MISCELLANEOUS)
KIT BASIN OR (CUSTOM PROCEDURE TRAY) ×2 IMPLANT
KIT ROOM TURNOVER OR (KITS) ×2 IMPLANT
MANIFOLD NEPTUNE II (INSTRUMENTS) ×2 IMPLANT
NEEDLE 22X1 1/2 (OR ONLY) (NEEDLE) ×2 IMPLANT
NS IRRIG 1000ML POUR BTL (IV SOLUTION) ×2 IMPLANT
PACK TOTAL JOINT (CUSTOM PROCEDURE TRAY) ×2 IMPLANT
PACK UNIVERSAL I (CUSTOM PROCEDURE TRAY) ×2 IMPLANT
PAD ARMBOARD 7.5X6 YLW CONV (MISCELLANEOUS) ×4 IMPLANT
PRESSURIZER FEMORAL UNIV (MISCELLANEOUS) IMPLANT
SET HNDPC FAN SPRY TIP SCT (DISPOSABLE) IMPLANT
STAPLER VISISTAT 35W (STAPLE) ×2 IMPLANT
SUCTION FRAZIER TIP 10 FR DISP (SUCTIONS) ×2 IMPLANT
SUT BONE WAX W31G (SUTURE) IMPLANT
SUT ETHIBOND NAB CT1 #1 30IN (SUTURE) ×6 IMPLANT
SUT MNCRL AB 3-0 PS2 18 (SUTURE) ×4 IMPLANT
SUT VIC AB 0 CT1 27 (SUTURE) ×2
SUT VIC AB 0 CT1 27XBRD ANBCTR (SUTURE) ×2 IMPLANT
SUT VIC AB 1 CT1 27 (SUTURE) ×2
SUT VIC AB 1 CT1 27XBRD ANBCTR (SUTURE) ×2 IMPLANT
SUT VIC AB 2-0 CT1 27 (SUTURE) ×1
SUT VIC AB 2-0 CT1 TAPERPNT 27 (SUTURE) ×1 IMPLANT
SYR CONTROL 10ML LL (SYRINGE) ×2 IMPLANT
TOWEL OR 17X24 6PK STRL BLUE (TOWEL DISPOSABLE) ×2 IMPLANT
TOWEL OR 17X26 10 PK STRL BLUE (TOWEL DISPOSABLE) ×2 IMPLANT
TOWER CARTRIDGE SMART MIX (DISPOSABLE) IMPLANT
TRAY FOLEY CATH 16FRSI W/METER (SET/KITS/TRAYS/PACK) IMPLANT
WATER STERILE IRR 1000ML POUR (IV SOLUTION) ×2 IMPLANT
WRAP KNEE MAXI GEL POST OP (GAUZE/BANDAGES/DRESSINGS) ×2 IMPLANT

## 2014-01-01 NOTE — H&P (Signed)
CHIEF COMPLAINT:  Painful left hip.   HISTORY OF PRESENT ILLNESS:  Stephanie Ramsey Ramsey is a very pleasant 63 year old white female who is seen today for followup of her left hip pain.  She has had a MRI scan of the left hip which did reveal fairly significant inflammatory arthritis of the left hip with also secondary iliopsoas bursitis.  She has had prominent subcortical cysts in the anterior aspect of the acetabulum and in the medial aspect of the femoral head with intense edema in the left femoral head and neck.  There was a left hip effusion noted.  The hip was injected by Dr. Ernestina Patches and she states that she really did not get much of a benefit from that in the long run.  MRI of the lumbar spine was also performed looking for possible other reasons and this was really not much of significant.  There was some minor bulging and no evidence of nerve root or spinal stenosis, just some mild facet hypertrophy.  She has now gotten to the point where she is walking with a cane because of severe pain and discomfort.  Her pain is in the anterior groin especially when walking and she also has pain in and around the trochanteric region and a little bit in the buttock area.  Again, the majority of her pain is in her groin.  She has been tried with conservative treatment for this certainly with injections but is still not having much benefit.  She returns today for re-evaluation.   PAST MEDICAL/SURGICAL HISTORY:  1.  1996 laparoscopic cholecystectomy. 2.  Carpal tunnel release bilaterally in 2000 and 2004. 3.  2011 for right total hip replacement. 4.  2011 for 2 lumpectomies in the right breast.  No radiation or chemotherapy.   She has also been hospitalized in 1998 for a herniated disc in her back.   MEDICATIONS: 1.  Levothyroxine 150 mcg daily. 2.  Simvastatin 10 mg daily. 3.  Sulfazine EC 500 mg 2 tabs twice a day for colitis. 4.  Cyclobenzaprine 5 mg p.r.n. 5.  Hydrocodone 5/500 p.r.n. pain. 6.  Tramadol 50 mg p.r.n.  pain. 7.  Fish oil 1360 two per day. 8.  Multivitamin daily. 9.  Folic acid 431 mcg daily. 10.  Calcium with D3 600 mg daily. 11.  Tumeric 800 mg 2 daily. 12.  Cinnamon 500 mg 1 daily. 13.  Ubiquinos 100 mg daily. 14.  B12 1000 mcg daily.  15.  She also takes a baby aspirin daily. 16.  L-lysine 500 mg daily. 17.  Joint soother herbal with Tumeric, tart cherry, ginger, and white willow.   ALLERGIES:  PENICILLIN WHICH CAUSES HER RASH, ITCHING.   REVIEW OF SYSTEMS:  Negative except for glasses, pneumonia, bronchitis and pleurisy 32 years ago while pregnant.  She does have occasional leg cramps.  She does have colitis and does have hematochezia at times.  Most recently colonoscopy by Dr. Earlean Shawl showed no colitis on the colonoscopy.  She is hypothyroid.  She has had breast cancer without radiation or chemotherapy, just lumpectomy of 2 masses.  She has nocturia x1.  She does have fainting with pain.   FAMILY HISTORY:  Positive for arthritis and cancer in her mother, father who has heart disease, hypertension, arthritis and kidney disease.  Brother with cancer and sisters with arthritis.   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  She quit smoking cigarettes in 1983.  Prior to that she was smoking 1/2 pack a day  for 12 years.  She does drink twice a week 1-2 drinks only.   PHYSICAL EXAMINATION:  Height is 64.5 inches, weight 161 pounds, BMI 27.2. Vital signs:  Temperature 97.7, pulse 72, respirations 12, blood pressure 126/86. Head is normocephalic. Eyes:  Pupils equal, round, reactive to light and accommodation with extraocular movements intact. Ears, nose and throat were benign. Chest had good expansion. Lungs were essentially clear. Cardiac:  Had a regular rhythm and rate.  Normal S1, S2 and no murmurs were noted. Pulses were 1+ bilateral and symmetric in the lower extremities bilaterally. Abdomen was scaphoid, soft, nontender.  No masses palpable.  Normal bowel  sounds present. Genital/rectal/breast exam not indicated for an orthopedic procedure. Musculoskeletal:  She has barely 20-30 degrees of internal and external rotation of the left hip with pain at the extremes.  She has a positive log roll.  I can only get her to about 80 degrees of flexion before she is very symptomatic.  A little tenderness over the trochanteric region on the left hip.   CLINICAL IMPRESSION:   1.  Osteoarthritis of the left hip. 2.  Colitis. 3.  Hypothyroidism. 4.  History of breast cancer on the right with lumpectomy.   RECOMMENDATIONS:   1.  At this time I have reviewed information from Dr. Iona Hansen who felt that there was no contraindication for surgery from a medical and cardiac standpoint.  EKGs were sent also with this.  These have also been reviewed. 2.  At this time our plan is to proceed with a left total hip arthroplasty.  Procedure, risks and benefits were explained to her fully and she is understanding.  I have shown her the prosthesis that will be used and all questions again were answered.   Mike Craze [place], [place] [phone]  01/01/2014 1:05 PM

## 2014-01-01 NOTE — Op Note (Signed)
NAMESHALENE, GALLEN NO.:  [id number]  MEDICAL RECORD NO.:  [id number]  LOCATION:  [facility]                         FACILITY:  [facility]  PHYSICIAN:  Vonna Kotyk. Delsy Etzkorn, M.D.DATE OF BIRTH:  03-Nov-1950  DATE OF PROCEDURE:  01/01/2014 DATE OF DISCHARGE:                              OPERATIVE REPORT   PREOPERATIVE DIAGNOSIS:  End-stage osteoarthritis, left hip.  POSTOPERATIVE DIAGNOSIS:  End-stage osteoarthritis, left hip.  PROCEDURE:  Left total hip replacement.  SURGEON:  Vonna Kotyk. Durward Fortes, M.D.  ASSISTANT:  Aaron Edelman D. Petrarca, PA-C.  ANESTHESIA:  General.  COMPLICATIONS:  None.  COMPONENTS:  DePuy LCS large stature 12 mm porous-coated femoral stem, a 50 mm outer diameter Gription 3 metallic acetabular component with an apex hole eliminator, and a Marathon polyethylene acetabular liner, +4 with a 10-degree posterior lip.  I used the 32 mm outer diameter head with a 1.5 mm neck length.  PROCEDURE:  Ms. Graig was met in the holding area, identified the left hip as the appropriate operative site, marked it accordingly.  Any questions were answered.  She was then transported to room #8 and placed under general anesthesia without difficulty.  The patient was then placed in the lateral decubitus position with the left side up and secured to the operating table with the Innomed hip system.  The left hip was then prepped with chlorhexidine scrub and DuraPrep x2 from the iliac crest to the ankle.  Sterile draping was performed.  Time- out was called.  A routine southern incision was utilized and via sharp dissection carried down to the subcutaneous tissue.  Gross bleeders were Bovie coagulated.  Abundant adipose tissue was incised to the level of the iliotibial band.  Self-retaining retractors were inserted.  The IT band was then incised, gluteus fibers were then bluntly separated, retractors were placed more deeply.  The deep fascia was then bluntly dissected  to reveal the short external rotators.  These were carefully incised with a Bovie.  Tendinous structures were tagged with 0-Ethibond suture.  I could palpate the sciatic nerve throughout the procedure, it was carefully protected.  Capsule was identified, it was incised, there was a very minimal clear yellow joint effusion, but bulging synovitis after incision through the capsule.  Hip was easily dislocated posteriorly and osteotomized up the femoral neck head junction.  The head was then delivered from the wound. Probably 80% of the head was devoid of articular cartilage with loose flaky cartilage in other areas.  I did not see any loose bodies in the acetabulum.  Piriformis fossa was then cleared.  Starter hole was then made.  Reaming was performed sequentially to 12 mm to accept a 12 mm component. Rasping was performed sequentially to a large stature 12 mm femoral stem.  This fit nicely on the calcar, but I did use a calcar reamer to obtain the appropriate angle.  Retractors were then placed about the acetabulum.  The labrum was sharply excised.  Reaming was performed beginning at 43 mm to 49 mm. Using the external guide and checking with a 48 and a 50 mm acetabular component, I thought he had excellent position of the acetabulum with  a nice tight fit on a 48, but it would completely seat.  Accordingly, the Gription 3 metallic acetabular component was then impacted nicely into the acetabulum, a circumferential bleeding bone was perfectly stable.  The trial polyethylene component was then screwed in place.  The 12 mm large stature femoral component was then impacted on the calcar.  A 32 mm outer diameter hip ball with 1.5 mm neck length was then applied to the stem.  The acetabulum stem was checked and was clear of any loose material.  Then, the entire construct was reduced.  Through a full range of motion, there was no toggling, there was no instability, felt leg lengths  remained equal.  The trial components were then removed.  The acetabulum was irrigated. Apex hole eliminator was inserted.  I did not think screws were necessary as it had nice tight fit.  The final marathon polyethylene liner was then impacted in place with a 10-degree posterior buildup.  The final femoral component was then impacted, flushed on the calcar, i.e., the large stature 12 mm component.  The Morse taper neck was then cleaned, the metallic 32 mm outer diameter hip ball was then impacted in place with a 1.5 mm neck length.  The acetabulum was inspected and cleaned and the hip was reduced.  Again, through a full range of motion, there was no instability.  Wound was again irrigated with saline solution.  Deep capsule was closed with interrupted #1 Ethibond.  The short external rotators were closed with the similar material.  The wound was again irrigated.  I did inject the deep capsule with 0.25% Marcaine with epinephrine.  The IT band was then closed with a running #1 Vicryl, subcu and several layers with 0 and 2-0 Vicryl, and then the subcu with 3-0 Monocryl. Skin closed with skin clips.  Sterile bulky dressing was applied followed by a knee immobilizer.  The patient tolerated the procedure without complications.     Vonna Kotyk. Durward Fortes, M.D.     PWW/MEDQ  D:  01/01/2014  T:  01/01/2014  Job:  [id number]

## 2014-01-01 NOTE — H&P (Signed)
  The recent History & Physical has been reviewed. I have personally examined the patient today. There is no interval change to the documented History & Physical. The patient would like to proceed with the procedure.  Greg Cratty, [place] 01/01/2014,  7:06 AM

## 2014-01-01 NOTE — H&P (Signed)
TOTAL HIP ADMISSION H&P  Patient is admitted for left total hip arthroplasty.  Subjective:  Chief Complaint: left hip pain  HPI: Stephanie Ramsey, 63 y.o. female, has a history of pain and functional disability in the left hip due to arthritis and patient has failed non-surgical conservative treatments for greater than 12 weeks to include NSAID's and/or analgesics and corticosteriod injections.  Onset of symptoms was gradual starting 2 years ago with gradually worsening course since that time.The patient noted no past surgery on the left hip(s).  Patient currently rates pain in the left hip at 6 out of 10 with activity. Patient has night pain and worsening of pain with activity and weight bearing. Patient has evidence of  by imaging studies. This condition presents safety issues increasing the risk of falls.  There is no current active infection.  There are no active problems to display for this patient.  Past Medical History  Diagnosis Date  . Complication of anesthesia     "stopped breathing during procedure and dizziness after procedures"  . Arthritis   . Hypothyroidism   . Hypercholesterolemia   . Ulcerative colitis   . Chronic back pain   . Cancer     right breast  . Pneumonia   . Carpal tunnel syndrome     Past Surgical History  Procedure Laterality Date  . Colonoscopy w/ biopsies and polypectomy    . Bilateral carpal tunnel release    . Cholecystectomy    . Trigger finger release    . Breast surgery      biopsy and lumpectomy right breast  . Joint replacement      Total right hip    Prescriptions prior to admission  Medication Sig Dispense Refill Last Dose  . aspirin 81 MG tablet Take 81 mg by mouth daily.   Past Week at Unknown time  . Calcium Carb-Cholecalciferol (CALCIUM + D3) 600-200 MG-UNIT TABS Take 1 tablet by mouth daily.   Past Week at Unknown time  . Cinnamon 500 MG capsule Take 500 mg by mouth daily.   Past Week at Unknown time  . cyclobenzaprine (FLEXERIL) 5 MG  tablet Take 5 mg by mouth 3 (three) times daily as needed for muscle spasms.   12/31/2013 at Unknown time  . folic acid (FOLVITE) 191 MCG tablet Take 400 mcg by mouth daily.   Past Week at Unknown time  . GLUCOSAMINE-CHONDROITIN PO Take 1 tablet by mouth daily. Glucosamine-chondroitin 1500-1200 mg   Past Week at Unknown time  . HYDROcodone-acetaminophen (VICODIN) 5-500 MG per tablet Take 1 tablet by mouth every 6 (six) hours as needed for pain.   Past Month at Unknown time  . levothyroxine (SYNTHROID, LEVOTHROID) 150 MCG tablet Take 150 mcg by mouth daily before breakfast.   01/01/2014 at 0445  . Multiple Vitamins-Minerals (MULTIVITAMIN WITH MINERALS) tablet Take 1 tablet by mouth daily.   Past Week at Unknown time  . Omega-3 Fatty Acids (FISH OIL) 1360 MG CAPS Take 1,360 mg by mouth 2 (two) times daily.   Past Month at Unknown time  . simvastatin (ZOCOR) 10 MG tablet Take 10 mg by mouth daily.   12/31/2013 at Unknown time  . sulfaSALAzine (AZULFIDINE) 500 MG EC tablet Take 1,000 mg by mouth 2 (two) times daily.   12/31/2013 at Unknown time  . TURMERIC PO Take 400 mg by mouth 2 (two) times daily.   Past Week at Unknown time  . Ubiquinol 100 MG CAPS Take 100 mg by mouth daily.  Past Week at Unknown time  . vitamin B-12 (CYANOCOBALAMIN) 1000 MCG tablet Take 1,000 mcg by mouth daily.   Past Week at Unknown time   Allergies  Allergen Reactions  . Penicillins Rash    History  Substance Use Topics  . Smoking status: Former Smoker    Types: Cigarettes  . Smokeless tobacco: Never Used     Comment: quit smoking cigarettes 32 years ago  . Alcohol Use: Yes     Comment: social    Family History  Problem Relation Age of Onset  . Breast cancer Mother   . Arthritis Other      ROS  Objective:  Physical Exam  Vital signs in last 24 hours: Temp:  [98.7 F (37.1 C)] 98.7 F (37.1 C) (11/17 0618) Pulse Rate:  [79] 79 (11/17 0617) Resp:  [18] 18 (11/17 0617) BP: (125)/(74) 125/74 mmHg (11/17  0617) SpO2:  [97 %] 97 % (11/17 0617) Weight:  [71.668 kg (158 lb)] 71.668 kg (158 lb) (11/17 0617)  Labs:   Estimated body mass index is 27.55 kg/(m^2) as calculated from the following:   Height as of this encounter: 5' 3.5" (1.613 m).   Weight as of this encounter: 71.668 kg (158 lb).   Imaging Review Plain radiographs demonstrate severe degenerative joint disease of the left hip(s). The bone quality appears to be good for age and reported activity level.  Assessment/Plan:  End stage arthritis, left hip  The patient history, physical examination, clinical judgement of the provider and imaging studies are consistent with end stage degenerative joint disease of the left hip(s) and total hip arthroplasty is deemed medically necessary. The treatment options including medical management, injection therapy, arthroscopy and arthroplasty were discussed at length. The risks and benefits of total hip arthroplasty were presented and reviewed. The risks due to aseptic loosening, infection, stiffness, dislocation/subluxation,  thromboembolic complications and other imponderables were discussed.  The patient acknowledged the explanation, agreed to proceed with the plan and consent was signed. Patient is being admitted for inpatient treatment for surgery, pain control, PT, OT, prophylactic antibiotics, VTE prophylaxis, progressive ambulation and ADL's and discharge planning.The patient is planning to be discharged home with home health services

## 2014-01-01 NOTE — Transfer of Care (Signed)
Immediate Anesthesia Transfer of Care Note  Patient: Stephanie Ramsey  Procedure(s) Performed: Procedure(s): LEFT TOTAL HIP ARTHROPLASTY (Left)  Patient Location: PACU  Anesthesia Type:General  Level of Consciousness: awake, alert , oriented and patient cooperative  Airway & Oxygen Therapy: Patient Spontanous Breathing and Patient connected to nasal cannula oxygen  Post-op Assessment: Report given to PACU RN, Post -op Vital signs reviewed and stable and Patient moving all extremities  Post vital signs: Reviewed and stable  Complications: No apparent anesthesia complications

## 2014-01-01 NOTE — Anesthesia Postprocedure Evaluation (Signed)
  Anesthesia Post-op Note  Patient: Stephanie Ramsey  Procedure(s) Performed: Procedure(s): LEFT TOTAL HIP ARTHROPLASTY (Left)  Patient Location: PACU  Anesthesia Type:MAC  Level of Consciousness: awake, alert  and oriented  Airway and Oxygen Therapy: Patient Spontanous Breathing and Patient connected to nasal cannula oxygen  Post-op Pain: none  Post-op Assessment: Post-op Vital signs reviewed, Patient's Cardiovascular Status Stable, Respiratory Function Stable and Patent Airway  Post-op Vital Signs: Reviewed and stable  Last Vitals:  Filed Vitals:   01/01/14 0950  BP: 86/42  Pulse: 68  Temp:   Resp: 17    Complications: No apparent anesthesia complications

## 2014-01-01 NOTE — Evaluation (Addendum)
Occupational Therapy Evaluation Patient Details Name: AMARACHUKWU LAKATOS MRN: [id number] DOB: Nov 20, 1950 Today's Date: 01/01/2014    History of Present Illness Pt admitted for Left THA posterior approach. Pt with prior R THA 2011, bil carpal tunnel release   Clinical Impression   Pt s/p above. Pt independent with ADLs, PTA. Feel pt will benefit from acute OT to increase independence with BADLs and reinforce precautions prior to d/c.     Follow Up Recommendations  No OT follow up;Supervision - Intermittent    Equipment Recommendations  Other (comment) (long handled sponge)    Recommendations for Other Services       Precautions / Restrictions Precautions Precautions: Posterior Hip;Fall Precaution Booklet Issued:  (one in room) Precaution Comments: educated on precautions Required Braces or Orthoses: Knee Immobilizer - Left Knee Immobilizer - Left:  (used in bed) Restrictions Weight Bearing Restrictions: Yes LLE Weight Bearing: Weight bearing as tolerated      Mobility Bed Mobility Overal bed mobility: Needs Assistance Bed Mobility: Supine to Sit;Sit to Supine Supine to sit: Mod assist Sit to supine: Min assist   General bed mobility comments: Assist with LE and trunk when going from supine to sitting position and cues for technique. Assist with LLE when going back to bed.  Transfers Overall transfer level: Needs assistance Equipment used: Rolling walker (2 wheeled) Transfers: Sit to/from Stand Sit to Stand: Min guard;Min assist         General transfer comment: cues for technique.          ADL Overall ADL's : Needs assistance/impaired     Grooming: Wash/dry face;Set up;Supervision/safety;Sitting               Lower Body Dressing: Minimal assistance;With adaptive equipment;Sit to/from stand   Toilet Transfer: Min guard;Ambulation;RW (bed and chair)           Functional mobility during ADLs: Min guard;Rolling walker General ADL Comments: Educated  on dressing technique and AE for LB ADLs. Pt donned panties with reacher. Educated on safety tips (sitting for most of LB ADLs, recommending having someone with her for shower transfer, rugs, safe shoewear, clutter). Educated on shower transfer technique. Educated on purpose of knee immobilizer. Discussed where pt could find long sponge.  Discussed to have pillow between legs if pt rolls to help maintain precautions.      Vision                     Perception     Praxis      Pertinent Vitals/Pain Pain Assessment: 0-10 Pain Score: 5  Pain Location: back Pain Descriptors / Indicators: Aching Pain Intervention(s): Repositioned;Monitored during session     Hand Dominance     Extremity/Trunk Assessment Upper Extremity Assessment Upper Extremity Assessment: Overall WFL for tasks assessed   Lower Extremity Assessment Lower Extremity Assessment: Defer to PT evaluation LLE Deficits / Details: limited by post-op pain and precautions   Cervical / Trunk Assessment Cervical / Trunk Assessment: Normal   Communication Communication Communication: No difficulties   Cognition Arousal/Alertness: Awake/alert Behavior During Therapy: WFL for tasks assessed/performed Overall Cognitive Status: Within Functional Limits for tasks assessed                     General Comments       Exercises       Shoulder Instructions      Home Living Family/patient expects to be discharged to:: Private residence Living Arrangements: Spouse/significant other Available Help at  Discharge: Family;Available PRN/intermittently Type of Home: House Home Access: Stairs to enter Entrance Stairs-Number of Steps: 2   Home Layout: Multi-level;Bed/bath upstairs Alternate Level Stairs-Number of Steps: 8   Bathroom Shower/Tub: Walk-in shower         Home Equipment: Environmental consultant - 2 wheels;Cane - single point;Bedside commode;Other (comment);Adaptive equipment Adaptive Equipment: Reacher;Sock  aid Additional Comments: main level has no bathroom has to go up to bed and bath or down to family room and bath      Prior Functioning/Environment Level of Independence: Independent        Comments: using sockaid    OT Diagnosis: Acute pain   OT Problem List: Decreased strength;Decreased activity tolerance;Decreased knowledge of use of DME or AE;Decreased knowledge of precautions;Pain   OT Treatment/Interventions: Self-care/ADL training;DME and/or AE instruction;Therapeutic activities;Balance training;Patient/family education    OT Goals(Current goals can be found in the care plan section) Acute Rehab OT Goals Patient Stated Goal: not stated OT Goal Formulation: With patient Time For Goal Achievement: 01/08/14 Potential to Achieve Goals: Good ADL Goals Pt Will Perform Lower Body Dressing: with modified independence;sit to/from stand;with adaptive equipment Pt Will Transfer to Toilet: with modified independence;ambulating (3 in 1 over commode) Pt Will Perform Toileting - Clothing Manipulation and hygiene: with modified independence;sit to/from stand Pt Will Perform Tub/Shower Transfer: with supervision;Shower transfer;ambulating;3 in 1;rolling walker Additional ADL Goal #1: Pt will perform bed mobility at supervision level as precursor for ADLs.  OT Frequency: Min 2X/week   Barriers to D/C:            Co-evaluation              End of Session Equipment Utilized During Treatment: Gait belt;Rolling walker;Left knee immobilizer  Activity Tolerance: Other (comment) (lightheaded) Patient left: in bed;with call bell/phone within reach   Time: 9872-1587 OT Time Calculation (min): 28 min Charges:  OT General Charges $OT Visit: 1 Procedure OT Evaluation $Initial OT Evaluation Tier I: 1 Procedure OT Treatments $Self Care/Home Management : 8-22 mins G-CodesBenito Mccreedy OTR/L [phone] 01/01/2014, 3:15 PM

## 2014-01-01 NOTE — Evaluation (Signed)
Physical Therapy Evaluation Patient Details Name: Stephanie Ramsey MRN: [id number] DOB: 02/07/1951 Today's Date: 01/01/2014   History of Present Illness  Pt admitted for Left THA posterior approach. Pt with prior R THA 2011, bil carpal tunnel release  Clinical Impression  Pt very positive and eager to progress mobility. Pt familiar with precautions from prior THA but required cues to maintain as well as to recall all 3. Pt educated for transfers, HEP, gait and function. Pt will continue to benefit from acute therapy to maximize independence and decrease burden of care adhering to precautions.    Follow Up Recommendations Home health PT;Supervision for mobility/OOB    Equipment Recommendations  None recommended by PT    Recommendations for Other Services       Precautions / Restrictions Precautions Precautions: Posterior Hip;Fall Precaution Booklet Issued: Yes (comment) Restrictions Weight Bearing Restrictions: Yes LLE Weight Bearing: Weight bearing as tolerated      Mobility  Bed Mobility Overal bed mobility: Needs Assistance Bed Mobility: Sit to Supine;Rolling;Sidelying to Sit Rolling: Min assist Sidelying to sit: Min assist   Sit to supine: Min assist   General bed mobility comments: pt preferred to attempt rolling and side to sit with pillow between knees to maintain precautions due to prior back surgery with increased difficulty. With return to bed performed sitting and pivot into bed with assist for LLE which pt stated was easier and agreed to continue mobility with pivot technique rather than rolling  Transfers Overall transfer level: Needs assistance   Transfers: Sit to/from Stand Sit to Stand: Min guard         General transfer comment: cues for bil hand placement and LLE positioning with cues for precuations and safety  Ambulation/Gait Ambulation/Gait assistance: Min guard Ambulation Distance (Feet): 15 Feet (x 2 trials) Assistive device: Rolling walker (2  wheeled) Gait Pattern/deviations: Step-to pattern;Decreased stance time - left;Decreased stride length   Gait velocity interpretation: Below normal speed for age/gender General Gait Details: cues for sequence and precautions  Stairs            Wheelchair Mobility    Modified Rankin (Stroke Patients Only)       Balance Overall balance assessment: Needs assistance   Sitting balance-Leahy Scale: Good       Standing balance-Leahy Scale: Fair                               Pertinent Vitals/Pain Pain Assessment: 0-10 Pain Score: 5  Pain Location: left hip and groin Pain Descriptors / Indicators: Aching Pain Intervention(s): Repositioned;Limited activity within patient's tolerance;Ice applied    Home Living Family/patient expects to be discharged to:: Private residence Living Arrangements: Spouse/significant other Available Help at Discharge: Family;Available PRN/intermittently Type of Home: House Home Access: Stairs to enter   Entrance Stairs-Number of Steps: 2 Home Layout: Multi-level;Bed/bath upstairs Home Equipment: Walker - 2 wheels;Cane - single point;Bedside commode;Other (comment) (sock aid) Additional Comments: main level has no bathroom has to go up to bed and bath or down to family room and bath    Prior Function Level of Independence: Independent               Hand Dominance        Extremity/Trunk Assessment   Upper Extremity Assessment: Overall WFL for tasks assessed           Lower Extremity Assessment: LLE deficits/detail   LLE Deficits / Details: limited by post-op pain  and precautions  Cervical / Trunk Assessment: Normal  Communication   Communication: No difficulties  Cognition Arousal/Alertness: Awake/alert Behavior During Therapy: WFL for tasks assessed/performed Overall Cognitive Status: Within Functional Limits for tasks assessed                      General Comments      Exercises Total Joint  Exercises Ankle Circles/Pumps: AROM;Left;5 reps;Supine Quad Sets: AROM;Left;5 reps;Supine Heel Slides: AAROM;Left;5 reps;Supine      Assessment/Plan    PT Assessment Patient needs continued PT services  PT Diagnosis Acute pain;Difficulty walking   PT Problem List Decreased strength;Decreased range of motion;Decreased activity tolerance;Decreased mobility;Pain;Decreased knowledge of precautions;Decreased knowledge of use of DME  PT Treatment Interventions DME instruction;Gait training;Stair training;Functional mobility training;Therapeutic activities;Therapeutic exercise;Patient/family education   PT Goals (Current goals can be found in the Care Plan section) Acute Rehab PT Goals Patient Stated Goal: return to hiking and biking PT Goal Formulation: With patient Time For Goal Achievement: 01/08/14 Potential to Achieve Goals: Good    Frequency 7X/week   Barriers to discharge        Co-evaluation               End of Session   Activity Tolerance: Patient tolerated treatment well Patient left: in bed;with call bell/phone within reach (pt denied OOB to chair end of session) Nurse Communication: Mobility status;Precautions;Weight bearing status         Time: 1331-1400 PT Time Calculation (min) (ACUTE ONLY): 29 min   Charges:   PT Evaluation $Initial PT Evaluation Tier I: 1 Procedure PT Treatments $Gait Training: 8-22 mins $Therapeutic Activity: 8-22 mins   PT G CodesMelford Aase 01/01/2014, 2:09 PM  Elwyn Reach, [place]

## 2014-01-01 NOTE — Plan of Care (Signed)
Problem: Acute Rehab PT Goals(only PT should resolve) Goal: Pt Will Transfer Bed To Chair/Chair To Bed Outcome: Not Applicable Date Met:  46/88/73

## 2014-01-01 NOTE — Op Note (Signed)
PATIENT ID:      Stephanie Ramsey  MRN:     [id number] DOB/AGE:    June 02, 1950 / 63 y.o.       OPERATIVE REPORT    DATE OF PROCEDURE:  01/01/2014       PREOPERATIVE DIAGNOSIS:   LEFT HIP OSTEOARTHRITIS-END STAGE                                                       Estimated body mass index is 27.55 kg/(m^2) as calculated from the following:   Height as of this encounter: 5' 3.5" (1.613 m).   Weight as of this encounter: 71.668 kg (158 lb).     POSTOPERATIVE DIAGNOSIS:   LEFT HIP OSTEOARTHRITIS -END STAGE                                                                    Estimated body mass index is 27.55 kg/(m^2) as calculated from the following:   Height as of this encounter: 5' 3.5" (1.613 m).   Weight as of this encounter: 71.668 kg (158 lb).     PROCEDURE:  Procedure(s): LEFT TOTAL HIP ARTHROPLASTY left     SURGEON:  Joni Fears, MD    ASSISTANT:   Biagio Borg, PA-C   (Present and scrubbed throughout the case, critical for assistance with exposure, retraction, instrumentation, and closure.)          ANESTHESIA: general     DRAINS: none :      TOURNIQUET TIME: * No tourniquets in log *    COMPLICATIONS:  None   CONDITION:  stable PROCEDURE IN DETAIL: [id number]   [name], [name] 01/01/2014, 9:06 AM

## 2014-01-02 ENCOUNTER — Encounter (HOSPITAL_COMMUNITY): Payer: Self-pay | Admitting: Orthopaedic Surgery

## 2014-01-02 LAB — CBC
HCT: 28.4 % — ABNORMAL LOW (ref 36.0–46.0)
Hemoglobin: 9.4 g/dL — ABNORMAL LOW (ref 12.0–15.0)
MCH: 32.9 pg (ref 26.0–34.0)
MCHC: 33.1 g/dL (ref 30.0–36.0)
MCV: 99.3 fL (ref 78.0–100.0)
PLATELETS: 252 10*3/uL (ref 150–400)
RBC: 2.86 MIL/uL — ABNORMAL LOW (ref 3.87–5.11)
RDW: 13 % (ref 11.5–15.5)
WBC: 6.8 10*3/uL (ref 4.0–10.5)

## 2014-01-02 LAB — BASIC METABOLIC PANEL WITH GFR
Anion gap: 8 (ref 5–15)
BUN: 7 mg/dL (ref 6–23)
CO2: 26 meq/L (ref 19–32)
Calcium: 8.5 mg/dL (ref 8.4–10.5)
Chloride: 106 meq/L (ref 96–112)
Creatinine, Ser: 0.61 mg/dL (ref 0.50–1.10)
GFR calc Af Amer: >90 mL/min
GFR calc non Af Amer: >90 mL/min
Glucose, Bld: 98 mg/dL (ref 70–99)
Potassium: 3.9 meq/L (ref 3.7–5.3)
Sodium: 140 meq/L (ref 137–147)

## 2014-01-02 NOTE — Progress Notes (Addendum)
Physical Therapy Treatment Patient Details Name: Stephanie Ramsey MRN: [id number] DOB: 09-23-1950 Today's Date: 01/02/2014    History of Present Illness Pt admitted for Left THA posterior approach. Pt with prior R THA 2011, bil carpal tunnel release    PT Comments    This is pt's second session on POD #1 and she is limited this PM by pain and lightheadedness.  She was agreeable to OOB to recliner chair and for completing her exercises, but she was fearful of passing out if she attempted gait (she reports she has a history of passing out when painful and does not have a high baseline BP).  We are deferring stair training until tomorrow AM and pt is hopeful to go home tomorrow.  I still feel HHPT is appropriate at discharge. Bring a cane for stair training tomorrow, and pt would like to go to therapy gym, not big stairwell to practice stairs.   Follow Up Recommendations  Home health PT;Supervision for mobility/OOB     Equipment Recommendations  None recommended by PT    Recommendations for Other Services   NA     Precautions / Restrictions Precautions Precautions: Posterior Hip;Fall Precaution Comments: educated on precautions Required Braces or Orthoses: Knee Immobilizer - Left Knee Immobilizer - Left: Other (comment) (for positioning in the bed. ) Restrictions LLE Weight Bearing: Weight bearing as tolerated    Mobility             Transfers Overall transfer level: Needs assistance Equipment used: Rolling walker (2 wheeled) Transfers: Sit to/from Stand Sit to Stand: Min assist         General transfer comment: Min assist to support trunk during transition to standing from [facility].  Pt able to preform her own pericare with min guard assist for balance and one hand supported on the RW.   Ambulation/Gait Ambulation/Gait assistance: Min guard Ambulation Distance (Feet): 5 Feet Assistive device: Rolling walker (2 wheeled) Gait Pattern/deviations: Step-to  pattern;Antalgic Gait velocity: decreased Gait velocity interpretation: Below normal speed for age/gender General Gait Details: Pt with slower, more antalgic gait pattern this PM.  She also reports lightheadedness in standing and feared that she could not walk due to possibly passing out (she has h/o passing out due to pain in the past per her report).              Balance Overall balance assessment: Needs assistance Sitting-balance support: Feet supported Sitting balance-Leahy Scale: Poor     Standing balance support: Bilateral upper extremity supported;Single extremity supported Standing balance-Leahy Scale: Fair                      Cognition Arousal/Alertness: Awake/alert Behavior During Therapy: WFL for tasks assessed/performed Overall Cognitive Status: Within Functional Limits for tasks assessed                      Exercises Total Joint Exercises Ankle Circles/Pumps: AROM;Both;10 reps;Supine Quad Sets: AROM;Left;10 reps;Supine Heel Slides: AAROM;Left;10 reps;Supine Hip ABduction/ADduction: AAROM;Left;10 reps;Supine Long Arc Quad: AROM;Left;10 reps;Seated        Pertinent Vitals/Pain Pain Assessment: 0-10 Pain Score: 7  Pain Location: left groin and thigh down to knee Pain Descriptors / Indicators: Aching Pain Intervention(s): Limited activity within patient's tolerance;Monitored during session;Repositioned;Ice applied     PT Goals (current goals can now be found in the care plan section) Acute Rehab PT Goals Patient Stated Goal: to get back to hiking again Progress towards PT goals: Not progressing toward  goals - comment (limited by pain and lightheadedness)    Frequency  7X/week    PT Plan Current plan remains appropriate       End of Session   Activity Tolerance: Patient limited by pain Patient left: in chair;with call bell/phone within reach     Time: 1612-1654 (minus 15 mins to step out of the room to help RNs ) PT Time  Calculation (min) (ACUTE ONLY): 42 min  Charges:  $Gait Training: 8-22 mins $Therapeutic Exercise: 8-22 mins $Therapeutic Activity: 8-22 mins            Remi Rester B. Zerina Hallinan, PT, DPT [phone]   01/02/2014, 7:17 PM

## 2014-01-02 NOTE — Progress Notes (Signed)
Occupational Therapy Treatment Patient Details Name: Stephanie Ramsey MRN: [id number] DOB: 1950/11/04 Today's Date: 01/02/2014    History of present illness Pt admitted for Left THA posterior approach. Pt with prior R THA 2011, bil carpal tunnel release   OT comments  Pt seen for acute OT treatment session addressing toilet transfers, toilet clothing manipulation and hygenie, and standing tolerance to complete grooming task at sink. Pt completed these tasks as detailed below. Pt progressing towards acute OT goals.  Follow Up Recommendations  No OT follow up;Supervision - Intermittent    Equipment Recommendations  Other (comment) (long-handled sponge)    Recommendations for Other Services      Precautions / Restrictions Precautions Precautions: Posterior Hip;Fall Precaution Comments: educated on precautions Required Braces or Orthoses: Knee Immobilizer - Left Restrictions Weight Bearing Restrictions: Yes LLE Weight Bearing: Weight bearing as tolerated       Mobility Bed Mobility               General bed mobility comments: pt in recliner  Transfers Overall transfer level: Needs assistance Equipment used: Rolling walker (2 wheeled) Transfers: Sit to/from Stand Sit to Stand: Min guard;Min assist         General transfer comment: min A from lower surface of recliner; min guard from elevated surface of 3n1; cues for pivoting technique with rw and post hip precautions.    Balance                                   ADL Overall ADL's : Needs assistance/impaired     Grooming: Wash/dry hands;Min guard;Standing                 Lower Body Dressing Details (indicate cue type and reason): educated on technique with Investment banker, operational Transfer: Min guard;Ambulation;RW (3n1 over toilet)   Toileting- Clothing Manipulation and Hygiene: Minimal assistance;Sit to/from stand Toileting - Clothing Manipulation Details (indicate cue type and reason): min A for  balance while pt completed hygenie tasks     Functional mobility during ADLs: Min guard;Rolling walker General ADL Comments: Pt completed ambulation from recliner to 3n1 over rw as detailed above. Discussed ADL techniques and home set up safety.      Vision                     Perception     Praxis      Cognition   Behavior During Therapy: WFL for tasks assessed/performed Overall Cognitive Status: Within Functional Limits for tasks assessed                       Extremity/Trunk Assessment               Exercises     Shoulder Instructions       General Comments      Pertinent Vitals/ Pain       Pain Assessment: 0-10 Pain Score: 5  Pain Location: Lt hip and groin Pain Descriptors / Indicators: Aching Pain Intervention(s): Monitored during session;Repositioned  Home Living                                          Prior Functioning/Environment              Frequency Min 2X/week  Progress Toward Goals  OT Goals(current goals can now be found in the care plan section)  Progress towards OT goals: Progressing toward goals  Acute Rehab OT Goals Patient Stated Goal: not stated OT Goal Formulation: With patient Time For Goal Achievement: 01/08/14 Potential to Achieve Goals: Good ADL Goals Pt Will Perform Lower Body Dressing: with modified independence;sit to/from stand;with adaptive equipment Pt Will Transfer to Toilet: with modified independence;ambulating Pt Will Perform Toileting - Clothing Manipulation and hygiene: with modified independence;sit to/from stand Pt Will Perform Tub/Shower Transfer: with supervision;Shower transfer;ambulating;3 in 1;rolling walker Additional ADL Goal #1: Pt will perform bed mobility at supervision level as precursor for ADLs.  Plan Discharge plan remains appropriate    Co-evaluation                 End of Session Equipment Utilized During Treatment: Gait belt;Rolling  walker;Left knee immobilizer   Activity Tolerance Patient tolerated treatment well;Other (comment) (no c/o dizziness or lightheadedness)   Patient Left in chair;with call bell/phone within reach   Nurse Communication          Time: 9629-5284 OT Time Calculation (min): 31 min  Charges: OT General Charges $OT Visit: 1 Procedure OT Treatments $Self Care/Home Management : 23-37 mins  Hortencia Pilar 01/02/2014, 11:10 AM

## 2014-01-02 NOTE — Discharge Instructions (Signed)
Information on my medicine - XARELTO (Rivaroxaban)  This medication education was reviewed with me or my healthcare representative as part of my discharge preparation.  The pharmacist that spoke with me during my hospital stay was:  Tad Moore, [facility]  Why was Xarelto prescribed for you? Xarelto was prescribed for you to reduce the risk of blood clots forming after orthopedic surgery. The medical term for these abnormal blood clots is venous thromboembolism (VTE).  What do you need to know about xarelto ? Take your Xarelto ONCE DAILY at the same time every day. You may take it either with or without food.  If you have difficulty swallowing the tablet whole, you may crush it and mix in applesauce just prior to taking your dose.  Take Xarelto exactly as prescribed by your doctor and DO NOT stop taking Xarelto without talking to the doctor who prescribed the medication.  Stopping without other VTE prevention medication to take the place of Xarelto may increase your risk of developing a clot.  After discharge, you should have regular check-up appointments with your healthcare provider that is prescribing your Xarelto.    What do you do if you miss a dose? If you miss a dose, take it as soon as you remember on the same day then continue your regularly scheduled once daily regimen the next day. Do not take two doses of Xarelto on the same day.   Important Safety Information A possible side effect of Xarelto is bleeding. You should call your healthcare provider right away if you experience any of the following: ? Bleeding from an injury or your nose that does not stop. ? Unusual colored urine (red or dark brown) or unusual colored stools (red or black). ? Unusual bruising for unknown reasons. ? A serious fall or if you hit your head (even if there is no bleeding).  Some medicines may interact with Xarelto and might increase your risk of bleeding while on Xarelto. To help avoid  this, consult your healthcare provider or pharmacist prior to using any new prescription or non-prescription medications, including herbals, vitamins, non-steroidal anti-inflammatory drugs (NSAIDs) and supplements.  This website has more information on Xarelto: [email or web address].[email or web address]

## 2014-01-02 NOTE — Plan of Care (Signed)
Problem: Phase I Progression Outcomes Goal: CMS/Neurovascular status WDL Outcome: Completed/Met Date Met:  01/02/14 Goal: Pain controlled with appropriate interventions Outcome: Completed/Met Date Met:  01/02/14 Goal: Dangle or out of bed evening of surgery Outcome: Completed/Met Date Met:  01/02/14 Goal: Initial discharge plan identified Outcome: Completed/Met Date Met:  01/02/14 Goal: Hemodynamically stable Outcome: Completed/Met Date Met:  01/02/14 Goal: Other Phase I Outcomes/Goals Outcome: Completed/Met Date Met:  01/02/14  Problem: Phase II Progression Outcomes Goal: Ambulates Outcome: Completed/Met Date Met:  01/02/14 Goal: Tolerating diet Outcome: Completed/Met Date Met:  01/02/14 Goal: Discharge plan established Outcome: Completed/Met Date Met:  01/02/14 Goal: Other Phase II Outcomes/Goals Outcome: Completed/Met Date Met:  01/02/14

## 2014-01-02 NOTE — Progress Notes (Signed)
Physical Therapy Treatment Patient Details Name: Stephanie Ramsey MRN: [id number] DOB: 08-07-1950 Today's Date: 01/02/2014    History of Present Illness Pt admitted for Left THA posterior approach. Pt with prior R THA 2011, bil carpal tunnel release    PT Comments    Pt is POD #1 and is moving well with min assist overall for gait and mobility with RW.  Pt was able to recall all three hip precautions and progress with her hip exercises.  I continue to recommend HHPT at discharge and she will be home with family's assist.  Plan to attempt starting stair training this PM.   Follow Up Recommendations  Home health PT;Supervision for mobility/OOB     Equipment Recommendations  None recommended by PT    Recommendations for Other Services   NA     Precautions / Restrictions Precautions Precautions: Posterior Hip;Fall Precaution Comments: educated on precautions Required Braces or Orthoses: Knee Immobilizer - Left Knee Immobilizer - Left: Other (comment) (for positioning in the bed) Restrictions LLE Weight Bearing: Weight bearing as tolerated    Mobility  Bed Mobility Overal bed mobility: Needs Assistance Bed Mobility: Sit to Supine       Sit to supine: Min assist   General bed mobility comments: Min assist to help lift her left leg into the bed.   Transfers Overall transfer level: Needs assistance Equipment used: Rolling walker (2 wheeled) Transfers: Sit to/from Stand Sit to Stand: Min assist         General transfer comment: Min assist to support trunk from lower recliner chair.  Verbal cues for safe hand placement and assit to power up over weak and painful left leg.   Ambulation/Gait Ambulation/Gait assistance: Min guard Ambulation Distance (Feet): 100 Feet Assistive device: Rolling walker (2 wheeled) Gait Pattern/deviations: Step-to pattern;Antalgic Gait velocity: decreased Gait velocity interpretation: Below normal speed for age/gender General Gait Details: Pt  with antalgic gait pattern, verbal cues for upright posture.  She reports that she feels as though her left leg is significantly longer than her right leg and has a tad of a vaulting gait pattern.               Balance Overall balance assessment: Needs assistance Sitting-balance support: Feet supported;No upper extremity supported Sitting balance-Leahy Scale: Good     Standing balance support: Bilateral upper extremity supported;No upper extremity supported;Single extremity supported Standing balance-Leahy Scale: Fair                      Cognition Arousal/Alertness: Awake/alert Behavior During Therapy: WFL for tasks assessed/performed Overall Cognitive Status: Within Functional Limits for tasks assessed                      Exercises Total Joint Exercises Ankle Circles/Pumps: AROM;Both;10 reps;Supine Quad Sets: AROM;Left;10 reps;Supine Heel Slides: AAROM;Left;10 reps;Supine Long Arc Quad: AROM;Left;10 reps;Seated        Pertinent Vitals/Pain Pain Assessment: 0-10 Pain Score: 5  Pain Location: left groin and thigh down to knee Pain Descriptors / Indicators: Aching;Burning Pain Intervention(s): Limited activity within patient's tolerance;Monitored during session;Repositioned;Ice applied           PT Goals (current goals can now be found in the care plan section) Acute Rehab PT Goals Patient Stated Goal: to get back to hiking again Progress towards PT goals: Progressing toward goals    Frequency  7X/week    PT Plan Current plan remains appropriate       End  of Session   Activity Tolerance: Patient tolerated treatment well Patient left: in bed;with call bell/phone within reach     Time: 1133-1157 PT Time Calculation (min) (ACUTE ONLY): 24 min  Charges:  $Gait Training: 8-22 mins $Therapeutic Exercise: 8-22 mins                      Lux Skilton B. Ravina Milner, PT, DPT [phone]   01/02/2014, 7:10 PM

## 2014-01-02 NOTE — Progress Notes (Signed)
Patient ID: Stephanie Ramsey, female   DOB: 1950/03/31, 63 y.o.   MRN: [id number] PATIENT ID: Stephanie Ramsey        MRN:  [id number]          DOB/AGE: [birth year] / 64 y.o.    Stephanie Fears, MD   Stephanie Borg, PA-C [address] [place], [place]  [address]                             [phone]   PROGRESS NOTE  Subjective:  negative for Chest Pain  negative for Shortness of Breath  negative for Nausea/Vomiting   negative for Calf Pain    Tolerating Diet: yes         Patient reports pain as mild and moderate.     OOB yesterday.  No syncopal episodes yet  Objective: Vital signs in last 24 hours:   Patient Vitals for the past 24 hrs:  BP Temp Temp src Pulse Resp SpO2  01/02/14 0704 (!) 97/49 mmHg 98.2 F (36.8 C) Oral 88 16 95 %  01/02/14 0329 (!) 92/44 mmHg 98.2 F (36.8 C) Oral 80 17 97 %  01/02/14 0000 - - - - 16 -  01/01/14 2000 - - - - 17 -  01/01/14 1300 102/61 mmHg 97.5 F (36.4 C) - 69 16 100 %  01/01/14 1145 (!) 110/56 mmHg 97.4 F (36.3 C) Oral 71 14 100 %  01/01/14 1130 - 97.4 F (36.3 C) - - - -  01/01/14 1124 (!) 111/58 mmHg - - 69 12 99 %  01/01/14 1115 - - - 70 12 100 %  01/01/14 1109 108/61 mmHg - - 67 11 100 %  01/01/14 1100 - - - 74 12 100 %  01/01/14 1054 (!) 122/55 mmHg - - 63 10 100 %  01/01/14 1045 - - - 75 11 100 %  01/01/14 1039 115/60 mmHg - - 67 (!) 9 100 %  01/01/14 1030 - - - 65 (!) 8 100 %  01/01/14 1024 (!) 111/58 mmHg - - (!) 59 12 100 %  01/01/14 1015 - - - (!) 56 12 100 %  01/01/14 1009 (!) 101/50 mmHg - - 62 (!) 9 100 %  01/01/14 1004 (!) 102/49 mmHg - - 63 13 99 %  01/01/14 1000 - - - 66 11 99 %  01/01/14 0954 (!) 89/43 mmHg - - 66 15 99 %  01/01/14 0950 (!) 86/42 mmHg - - 68 17 97 %  01/01/14 0945 - - - 68 17 98 %  01/01/14 0941 (!) 85/37 mmHg - - 77 14 98 %  01/01/14 0939 (!) 82/41 mmHg - - 86 12 100 %  01/01/14 0938 - 97.5 F (36.4 C) - - - -      Intake/Output from previous day:   11/17 0701 - 11/18 0700 In: 3080  [P.O.:480; I.V.:2600] Out: 400    Intake/Output this shift:   11/18 0701 - 11/18 1900 In: 0929 [P.O.:240; I.V.:611] Out: -    Intake/Output      11/17 0701 - 11/18 0700 11/18 0701 - 11/19 0700   P.O. 480 240   I.V. (mL/kg) 2600 (36.3) 611 (8.5)   IV Piggyback  400   Total Intake(mL/kg) 3080 (43) 1251 (17.5)   Blood 400    Total Output 400     Net +2680 +1251        Urine Occurrence 2 x  Stool Occurrence 1 x       LABORATORY DATA: No results for input(s): WBC, HGB, HCT, PLT in the last 168 hours. No results for input(s): NA, K, CL, CO2, BUN, CREATININE, GLUCOSE, CALCIUM in the last 168 hours. Lab Results  Component Value Date   INR 1.03 12/21/2013   INR 1.01 03/07/2009    Recent Radiographic Studies :  Chest 2 View  12/21/2013   CLINICAL DATA:  63 year old female under preoperative evaluation prior to left total hip arthroplasty scheduled for 01/01/2014. No current chest complaints. History right-sided breast cancer. Prior history of smoking (quit 32 years ago).  EXAM: CHEST  2 VIEW  COMPARISON:  Chest x-Vick 03/07/2009.  FINDINGS: Lung volumes are normal. No consolidative airspace disease. No pleural effusions. No pneumothorax. No pulmonary nodule or mass noted. Pulmonary vasculature and the cardiomediastinal silhouette are within normal limits. Surgical clips project over the right upper quadrant of the abdomen, presumably from prior cholecystectomy. Multiple surgical clips in the right breast and right axillary region, suggesting prior lumpectomy and axillary nodal dissection.  IMPRESSION: 1. No radiographic evidence of acute cardiopulmonary disease.   Electronically Signed   By: [name] M.D.   On: 12/21/2013 16:29   Dg Pelvis Portable  01/01/2014   CLINICAL DATA:  Status post left total hip arthroplasty.  EXAM: PORTABLE PELVIS 1-2 VIEWS  COMPARISON:  March 13, 2009.  FINDINGS: Status post bilateral total hip arthroplasties, most recently on the left. The femoral and  acetabular components appear to be well situated. No acute fracture or dislocation is noted.  IMPRESSION: Status post bilateral total hip arthroplasties, most recently on the left.   Electronically Signed   By: Sabino Dick M.D.   On: 01/01/2014 10:48   Dg Hip Portable 1 View Left  01/01/2014   CLINICAL DATA:  Postoperative state.  EXAM: PORTABLE LEFT HIP - 1 VIEW  COMPARISON:  None.  FINDINGS: Status post left total hip arthroplasty. The femoral and acetabular components appear to be in grossly good position. No fracture or dislocation is noted.  IMPRESSION: Status post left total hip arthroplasty.   Electronically Signed   By: Sabino Dick M.D.   On: 01/01/2014 13:24     Examination:  General appearance: alert, cooperative and mild distress Resp: clear to auscultation bilaterally Cardio: regular rate and rhythm GI: normal findings: bowel sounds normal  Wound Exam: clean, dry, intact   Drainage:  Scant/small amount Serosanguinous exudate on dressing  Motor Exam: EHL, FHL, Anterior Tibial and Posterior Tibial Intact  Sensory Exam: Superficial Peroneal, Deep Peroneal and Tibial normal  Vascular Exam: Left posterior tibial artery has 1+ (weak) pulse  Assessment:    1 Day Post-Op  Procedure(s) (LRB): LEFT TOTAL HIP ARTHROPLASTY (Left)  ADDITIONAL DIAGNOSIS:  Active Problems:   S/P total hip arthroplasty Presyncopal episodes    Plan: Physical Therapy as ordered Weight Bearing as Tolerated (WBAT)  DVT Prophylaxis:  Xarelto, Foot Pumps and TED hose  DISCHARGE PLAN: Home  DISCHARGE NEEDS: HHPT, Walker and 3-in-1 comode seat        Stephanie Ramsey  01/02/2014 7:20 AM

## 2014-01-03 DIAGNOSIS — M1612 Unilateral primary osteoarthritis, left hip: Secondary | ICD-10-CM | POA: Diagnosis present

## 2014-01-03 LAB — CBC
HCT: 29.3 % — ABNORMAL LOW (ref 36.0–46.0)
Hemoglobin: 9.5 g/dL — ABNORMAL LOW (ref 12.0–15.0)
MCH: 31.6 pg (ref 26.0–34.0)
MCHC: 32.4 g/dL (ref 30.0–36.0)
MCV: 97.3 fL (ref 78.0–100.0)
PLATELETS: 240 10*3/uL (ref 150–400)
RBC: 3.01 MIL/uL — ABNORMAL LOW (ref 3.87–5.11)
RDW: 13.2 % (ref 11.5–15.5)
WBC: 8.1 10*3/uL (ref 4.0–10.5)

## 2014-01-03 LAB — BASIC METABOLIC PANEL
ANION GAP: 12 (ref 5–15)
BUN: 8 mg/dL (ref 6–23)
CHLORIDE: 102 meq/L (ref 96–112)
CO2: 26 mEq/L (ref 19–32)
CREATININE: 0.62 mg/dL (ref 0.50–1.10)
Calcium: 8.8 mg/dL (ref 8.4–10.5)
Glucose, Bld: 125 mg/dL — ABNORMAL HIGH (ref 70–99)
Potassium: 3.8 mEq/L (ref 3.7–5.3)
Sodium: 140 mEq/L (ref 137–147)

## 2014-01-03 LAB — GLUCOSE, CAPILLARY: Glucose-Capillary: 147 mg/dL — ABNORMAL HIGH (ref 70–99)

## 2014-01-03 MED ORDER — ENSURE COMPLETE PO LIQD
237.0000 mL | Freq: Two times a day (BID) | ORAL | Status: DC
  Administered 2014-01-03 – 2014-01-04 (×2): 237 mL via ORAL

## 2014-01-03 MED ORDER — RIVAROXABAN 10 MG PO TABS: 10.0000 mg | ORAL_TABLET | Freq: Every day | ORAL | Status: DC

## 2014-01-03 MED ORDER — HYDROCODONE-ACETAMINOPHEN 5-325 MG PO TABS
0.5000 | ORAL_TABLET | ORAL | Status: DC | PRN
  Administered 2014-01-03 – 2014-01-04 (×3): 1 via ORAL
  Filled 2014-01-03 (×3): qty 1

## 2014-01-03 MED ORDER — HYDROCODONE-ACETAMINOPHEN 5-325 MG PO TABS: 0.5000 | ORAL_TABLET | ORAL | Status: DC | PRN

## 2014-01-03 MED ORDER — CYCLOBENZAPRINE HCL 10 MG PO TABS
5.0000 mg | ORAL_TABLET | Freq: Two times a day (BID) | ORAL | Status: DC | PRN
  Administered 2014-01-03 – 2014-01-04 (×2): 5 mg via ORAL
  Filled 2014-01-03 (×2): qty 1

## 2014-01-03 NOTE — Care Management Note (Signed)
CARE MANAGEMENT NOTE 01/03/2014  Patient:  Stephanie Ramsey, Stephanie Ramsey   Account Number:  [id number]  Date Initiated:  01/02/2014  Documentation initiated by:  Ricki Miller  Subjective/Objective Assessment:   63 yr old female admitted with left hip osteoarthritis. Patient had a left total hip arthroplasty.     Action/Plan:   Case manager spoke with patient concerning home health and DME needs. Patient was preoperatively setup with [facility], no changes. Has rolling walker and 3in1.  CM will follow, patient many need SNF.   Anticipated DC Date:  01/05/2014   Anticipated DC Plan:    In-house referral  Clinical Social Worker      DC Planning Services  CM consult      [facility] Choice  HOME HEALTH   Choice offered to / List presented to:  C-1 Patient   DME arranged  NA        [place] arranged  [place]   Status of service:  In process, will continue to follow Discharge Disposition:    Per UR Regulation:  Reviewed for med. necessity/level of care/duration of stay

## 2014-01-03 NOTE — Clinical Social Work Psychosocial (Signed)
Clinical Social Work Department BRIEF PSYCHOSOCIAL ASSESSMENT 01/03/2014  Patient:  Stephanie Ramsey, Stephanie Ramsey     Account Number:  [id number]     Admit date:  01/01/2014  Clinical Social Worker:  Delrae Sawyers  Date/Time:  01/03/2014 03:11 PM  Referred by:  Physician  Date Referred:  01/03/2014 Referred for  SNF Placement   Other Referral:   none.   Interview type:  Patient Other interview type:   none.    PSYCHOSOCIAL DATA Living Status:  HUSBAND Admitted from facility:   Level of care:   Primary support name:  Stephanie Ramsey Primary support relationship to patient:  SPOUSE Degree of support available:   Strong support system.    CURRENT CONCERNS Current Concerns  Post-Acute Placement   Other Concerns:   none.    SOCIAL WORK ASSESSMENT / PLAN CSW received referral for possible SNF placement at time of discharge. CSW met with pt at bedside to discuss discharge disposition. Pt informed CSW pt was interested in SNF placement at time of discharge if pt was still unable to stand due to low blood pressure.    Pt stated pt's husband Stephanie Ramsey) and [name] will be visiting pt at bedside on 01/03/2014 and both are supportive of pt.    CSW to continue to follow and assist with discharge planning needs.   Assessment/plan status:  Psychosocial Support/Ongoing Assessment of Needs Other assessment/ plan:   none.   Information/referral to community resources:   [facility] bed offers.    PATIENT'S/FAMILY'S RESPONSE TO PLAN OF CARE: Pt understanding and agreeable to CSW plan of care. Pt expressed hopefulness of medication change to help with pt's low blood pressure when standing. Pt expressed no further questions or concerns at this time.       Lubertha Sayres, [place] ([phone]) Licensed Clinical Social Worker Orthopedics [phone]) and Surgical [phone])

## 2014-01-03 NOTE — Clinical Social Work Placement (Signed)
Clinical Social Work Department CLINICAL SOCIAL WORK PLACEMENT NOTE 01/03/2014  Patient:  Stephanie Ramsey, Stephanie Ramsey  Account Number:  [id number] Admit date:  01/01/2014  Clinical Social Worker:  Delrae Sawyers  Date/time:  01/03/2014 03:15 PM  Clinical Social Work is seeking post-discharge placement for this patient at the following level of care:   [place]   (*CSW will update this form in Epic as items are completed)   01/03/2014  Patient/family provided with [facility] Department of Clinical Social Work's list of facilities offering this level of care within the geographic area requested by the patient (or if unable, by the patient's family).  01/03/2014  Patient/family informed of their freedom to choose among providers that offer the needed level of care, that participate in Medicare, Medicaid or managed care program needed by the patient, have an available bed and are willing to accept the patient.  01/03/2014  Patient/family informed of [facility]' ownership interest in [facility], as well as of the fact that they are under no obligation to receive care at this facility.  PASARR submitted to EDS on 01/03/2014 PASARR number received on 01/03/2014  FL2 transmitted to all facilities in geographic area requested by pt/family on  01/03/2014 FL2 transmitted to all facilities within larger geographic area on   Patient informed that his/her managed care company has contracts with or will negotiate with  certain facilities, including the following:     Patient/family informed of bed offers received:   Patient chooses bed at  Physician recommends and patient chooses bed at    Patient to be transferred to  on   Patient to be transferred to facility by  Patient and family notified of transfer on  Name of family member notified:    The following physician request were entered in Epic:   Additional Comments:  [facility] [place] [phone]) Licensed Clinical Social  Worker Orthopedics [phone]) and Surgical [phone])

## 2014-01-03 NOTE — Progress Notes (Signed)
Physical Therapy Treatment Patient Details Name: Stephanie Ramsey MRN: [id number] DOB: 1950-05-04 Today's Date: 01/03/2014    History of Present Illness Pt admitted for Left THA posterior approach. Pt with prior R THA 2011, bil carpal tunnel release    PT Comments    Pt is slowly starting to progress compared to this AM.  Orthostatic vitals are better and she is better able to tolerate up OOB.  This session was limited to in-room gait due to pt energy level and anxiety related to passing out.  PT will continue to follow acutely, but if pt doesn't progress she will need to consider SNF level rehab.   Follow Up Recommendations  SNF     Equipment Recommendations  None recommended by PT    Recommendations for Other Services   NA     Precautions / Restrictions Precautions Precautions: Posterior Hip;Fall Required Braces or Orthoses: Knee Immobilizer - Left Knee Immobilizer - Left: Other (comment) (in bed for positioning) Restrictions LLE Weight Bearing: Weight bearing as tolerated    Mobility  Bed Mobility Overal bed mobility: Needs Assistance Bed Mobility: Supine to Sit     Supine to sit: Min assist Sit to supine: Min assist   General bed mobility comments: Min assist to help progress left leg over the EOB.   Transfers Overall transfer level: Needs assistance Equipment used: Rolling walker (2 wheeled) Transfers: Sit to/from [facility] Sit to Stand: Min assist Stand pivot transfers: Min guard       General transfer comment: Min assist to get to standing and min guard assist for safety during transfer with RW. Verbal cues that she was allowed to bend at the hip during transition to sit as pt tends to keep trunk and hip rigid and fall backwards.   Ambulation/Gait Ambulation/Gait assistance: Min guard Ambulation Distance (Feet): 5 Feet Assistive device: Rolling walker (2 wheeled) Gait Pattern/deviations: Step-to pattern;Antalgic     General Gait  Details: Pt only up for in-room, short distance gait.           Balance Overall balance assessment: Needs assistance Sitting-balance support: Feet supported;No upper extremity supported Sitting balance-Leahy Scale: Good     Standing balance support: Bilateral upper extremity supported;No upper extremity supported;Single extremity supported Standing balance-Leahy Scale: Fair Standing balance comment: Pt was able to stand long enough to get her standing orthostatics this PM.                     Cognition Arousal/Alertness: Awake/alert Behavior During Therapy: WFL for tasks assessed/performed Overall Cognitive Status: Within Functional Limits for tasks assessed                      Exercises Total Joint Exercises Ankle Circles/Pumps: AROM;Both;10 reps;Supine Quad Sets: AROM;Left;10 reps;Supine Short Arc Quad: AAROM;Left;10 reps;Supine Heel Slides: AAROM;Left;10 reps;Supine Hip ABduction/ADduction: AAROM;Left;10 reps;Supine Long Arc Quad: AROM;Right;10 reps;Seated        Pertinent Vitals/Pain Pain Assessment: 0-10 Pain Score: 5  Pain Location: left hip Pain Descriptors / Indicators: Aching;Burning Pain Intervention(s): Limited activity within patient's tolerance;Monitored during session;Repositioned           PT Goals (current goals can now be found in the care plan section) Acute Rehab PT Goals Patient Stated Goal: to get back to hiking again Progress towards PT goals: Progressing toward goals    Frequency  7X/week    PT Plan Current plan remains appropriate       End of Session Equipment Utilized  During Treatment: Gait belt Activity Tolerance: Patient limited by pain;Patient limited by fatigue Patient left: in chair;with call bell/phone within reach     Time: 7564-3329 PT Time Calculation (min) (ACUTE ONLY): 34 min  Charges:  $Therapeutic Exercise: 8-22 mins $Therapeutic Activity: 8-22 mins                      Stephanie Ramsey B.  Stephanie Ramsey, PT, DPT [phone]   01/03/2014, 6:22 PM

## 2014-01-03 NOTE — Progress Notes (Signed)
   01/03/14 0956  Vital Signs  Pulse Rate 98  Patient Position (if appropriate) Orthostatic Vitals  Orthostatic Lying   BP- Lying 105/61 mmHg  Pulse- Lying 98  Orthostatic Sitting  BP- Sitting (!) 80/54 mmHg  Pulse- Sitting 97  Orthostatic Standing at 0 minutes  BP- Standing at 0 minutes (unable to get reading, pt too lightheaded in standing.)   Orthostatic vitals attempted with significant drop from supine to sitting.  We were unable to get standing BP as pt became very light headed and had to return to supine.  She was unable to stand >20 seconds before her eyes started rolling and she was unable to maintain standing.   Thanks,  Barbarann Ehlers. [place], [place], DPT [phone]

## 2014-01-03 NOTE — Progress Notes (Signed)
Patient ID: Stephanie Ramsey, female   DOB: [birth year], 63 y.o.   MRN: [id number] PATIENT ID: Stephanie Ramsey        MRN:  [id number]          DOB/AGE: [birth year] / 63 y.o.    Joni Fears, MD   Biagio Borg, PA-C [address] [place], [place]  [address]                             [phone]   PROGRESS NOTE  Subjective:  negative for Chest Pain  negative for Shortness of Breath  negative for Nausea/Vomiting   negative for Calf Pain    Tolerating Diet: yes         Patient reports pain as mild and moderate.     No BM yet per patient  Objective: Vital signs in last 24 hours:   Patient Vitals for the past 24 hrs:  BP Temp Temp src Pulse Resp SpO2  01/03/14 0956 - - - 98 - -  01/03/14 0844 109/61 mmHg 98.6 F (37 C) Oral 87 - 100 %  01/03/14 0622 (!) 107/51 mmHg 98.4 F (36.9 C) Oral 83 15 -  01/02/14 2316 (!) 103/56 mmHg 98.5 F (36.9 C) Oral 93 17 93 %  01/02/14 1600 122/62 mmHg 97.9 F (36.6 C) - 97 18 97 %      Intake/Output from previous day:   11/18 0701 - 11/19 0700 In: 2561 [P.O.:1550; I.V.:611] Out: -    Intake/Output this shift:   11/19 0701 - 11/19 1900 In: 240 [P.O.:240] Out: -    Intake/Output      11/18 0701 - 11/19 0700 11/19 0701 - 11/20 0700   P.O. 1550 240   I.V. (mL/kg) 611 (8.5)    IV Piggyback 400    Total Intake(mL/kg) 2561 (35.7) 240 (3.3)   Blood     Total Output       Net +2561 +240        Urine Occurrence  2 x      LABORATORY DATA:  Recent Labs  01/02/14 0607 01/03/14 0701  WBC 6.8 8.1  HGB 9.4* 9.5*  HCT 28.4* 29.3*  PLT 252 240    Recent Labs  01/02/14 0607 01/03/14 0701  NA 140 140  K 3.9 3.8  CL 106 102  CO2 26 26  BUN 7 8  CREATININE 0.61 0.62  GLUCOSE 98 125*  CALCIUM 8.5 8.8   Lab Results  Component Value Date   INR 1.03 12/21/2013   INR 1.01 03/07/2009    Recent Radiographic Studies :  Chest 2 View  12/21/2013   CLINICAL DATA:  63 year old female under preoperative evaluation prior to left total  hip arthroplasty scheduled for 01/01/2014. No current chest complaints. History right-sided breast cancer. Prior history of smoking (quit 32 years ago).  EXAM: CHEST  2 VIEW  COMPARISON:  Chest x-Clipper 03/07/2009.  FINDINGS: Lung volumes are normal. No consolidative airspace disease. No pleural effusions. No pneumothorax. No pulmonary nodule or mass noted. Pulmonary vasculature and the cardiomediastinal silhouette are within normal limits. Surgical clips project over the right upper quadrant of the abdomen, presumably from prior cholecystectomy. Multiple surgical clips in the right breast and right axillary region, suggesting prior lumpectomy and axillary nodal dissection.  IMPRESSION: 1. No radiographic evidence of acute cardiopulmonary disease.   Electronically Signed   By: [name] M.D.   On: 12/21/2013 16:29  Dg Pelvis Portable  01/01/2014   CLINICAL DATA:  Status post left total hip arthroplasty.  EXAM: PORTABLE PELVIS 1-2 VIEWS  COMPARISON:  March 13, 2009.  FINDINGS: Status post bilateral total hip arthroplasties, most recently on the left. The femoral and acetabular components appear to be well situated. No acute fracture or dislocation is noted.  IMPRESSION: Status post bilateral total hip arthroplasties, most recently on the left.   Electronically Signed   By: Sabino Dick M.D.   On: 01/01/2014 10:48   Dg Hip Portable 1 View Left  01/01/2014   CLINICAL DATA:  Postoperative state.  EXAM: PORTABLE LEFT HIP - 1 VIEW  COMPARISON:  None.  FINDINGS: Status post left total hip arthroplasty. The femoral and acetabular components appear to be in grossly good position. No fracture or dislocation is noted.  IMPRESSION: Status post left total hip arthroplasty.   Electronically Signed   By: Sabino Dick M.D.   On: 01/01/2014 13:24     Examination:  General appearance: alert, cooperative, mild distress and moderate distress  Wound Exam: clean, dry, intact   Drainage:  None: wound tissue  dry  Motor Exam: EHL, FHL, Anterior Tibial and Posterior Tibial Intact  Sensory Exam: Superficial Peroneal, Deep Peroneal and Tibial normal  Vascular Exam: Left posterior tibial artery has 1+ (weak) pulse  Assessment:    2 Days Post-Op  Procedure(s) (LRB): LEFT TOTAL HIP ARTHROPLASTY (Left)  Orthostatic hypotension - previous history   ADDITIONAL DIAGNOSIS:  Active Problems:   S/P total hip arthroplasty     Plan: Physical Therapy as ordered Weight Bearing as Tolerated (WBAT)  DVT Prophylaxis:  Xarelto, Foot Pumps and TED hose  DISCHARGE PLAN: Home possible SNF  DISCHARGE NEEDS: HHPT, Walker and 3-in-1 comode seat        Heberto Sturdevant  01/03/2014 1:19 PM

## 2014-01-03 NOTE — Progress Notes (Signed)
Patient's vitals re-assessed after hypotensive episode this am.  Patient states that she feels that she did "black out".  Biagio Borg PA made aware.  No new orders at this time.

## 2014-01-03 NOTE — Progress Notes (Signed)
Alerted by nurse tech about patient feeling dizzy. Patient sitting on BSC. Nurse tech convey patient felt a little dizzy prior to standing and upon sitting on the commode became really dizzy her eyes rolled back but she was still conscious and awake. Patient said she "passed out". Unable to get a BP reading on the dinamap machine while patient sitting up. Patient was helped back into bed. BP laying in bed 107/51. Patient CBG 147. Will check patient hgb this morning and notify MD.

## 2014-01-03 NOTE — Plan of Care (Signed)
Problem: Phase III Progression Outcomes Goal: Pain controlled on oral analgesia Outcome: Completed/Met Date Met:  01/03/14 Goal: Ambulates Outcome: Completed/Met Date Met:  01/03/14 Goal: Incision clean - minimal/no drainage Outcome: Completed/Met Date Met:  01/03/14     

## 2014-01-03 NOTE — Progress Notes (Signed)
Physical Therapy Treatment Patient Details Name: Stephanie Ramsey MRN: [id number] DOB: 07-Oct-1950 Today's Date: 01/03/2014    History of Present Illness Pt admitted for Left THA posterior approach. Pt with prior R THA 2011, bil carpal tunnel release    PT Comments    Pt is POD #2 and is limited by lightheadedness and low BPs in sitting and likely in standing.  Pt feels very weak.  RN aware and is in contact with MD.  Pt remains min assist, but was unable to walk or tolerate OOB to chair right now.  PT will continue to follow acutely.  If she doesn't make progress we may have to pursue SNF level rehab.   Follow Up Recommendations  Home health PT;Supervision for mobility/OOB     Equipment Recommendations  None recommended by PT    Recommendations for Other Services   NA     Precautions / Restrictions Precautions Precautions: Posterior Hip;Fall Required Braces or Orthoses: Knee Immobilizer - Left Knee Immobilizer - Left: Other (comment) (for positioning in the bed) Restrictions LLE Weight Bearing: Weight bearing as tolerated    Mobility  Bed Mobility Overal bed mobility: Needs Assistance Bed Mobility: Supine to Sit;Sit to Supine     Supine to sit: Min assist Sit to supine: Min assist   General bed mobility comments: Min assist to support left leg during transitions.   Transfers Overall transfer level: Needs assistance Equipment used: Rolling walker (2 wheeled) Transfers: Sit to/from Stand Sit to Stand: Min assist         General transfer comment: Min assist to support trunk during transitions to stand.  Pt moving slowly today.    Ambulation/Gait             General Gait Details: unable at this time due to lightheadedness and low BPs. RN made aware.           Balance Overall balance assessment: Needs assistance Sitting-balance support: Feet supported;No upper extremity supported Sitting balance-Leahy Scale: Good     Standing balance support: Bilateral  upper extremity supported;Single extremity supported Standing balance-Leahy Scale: Fair Standing balance comment: Pt unable to stand long enough to get BPs (not longer than 20 seconds today) and even that was difficult as pt got too lightheaded.                      Cognition Arousal/Alertness: Awake/alert Behavior During Therapy: WFL for tasks assessed/performed Overall Cognitive Status: Within Functional Limits for tasks assessed                      Exercises Total Joint Exercises Ankle Circles/Pumps: AROM;Both;10 reps;Supine Quad Sets: AROM;Left;10 reps;Supine Short Arc Quad: AAROM;Left;10 reps;Supine Heel Slides: AAROM;Left;10 reps;Supine Hip ABduction/ADduction: AAROM;Left;10 reps;Supine        Pertinent Vitals/Pain Pain Assessment: 0-10 Pain Score: 7  Pain Location: left hip and low back Pain Descriptors / Indicators: Burning;Aching Pain Intervention(s): Limited activity within patient's tolerance;Monitored during session;Repositioned           PT Goals (current goals can now be found in the care plan section) Acute Rehab PT Goals Patient Stated Goal: to get back to hiking again Progress towards PT goals: Not progressing toward goals - comment (due to (+) orthostatics)    Frequency  7X/week    PT Plan Current plan remains appropriate       End of Session Equipment Utilized During Treatment: Gait belt Activity Tolerance: Patient limited by pain;Treatment limited secondary to  medical complications (Comment) (limited by low sitting and standing BPs) Patient left: in bed;with call bell/phone within reach     Time: 0953-1030 PT Time Calculation (min) (ACUTE ONLY): 37 min  Charges:  $Therapeutic Exercise: 8-22 mins $Therapeutic Activity: 8-22 mins                      Meia Emley B. Shama Monfils, PT, DPT [phone]   01/03/2014, 6:14 PM

## 2014-01-04 LAB — BASIC METABOLIC PANEL
Anion gap: 11 (ref 5–15)
BUN: 9 mg/dL (ref 6–23)
CO2: 27 meq/L (ref 19–32)
CREATININE: 0.61 mg/dL (ref 0.50–1.10)
Calcium: 9.1 mg/dL (ref 8.4–10.5)
Chloride: 102 mEq/L (ref 96–112)
GFR calc Af Amer: >90 mL/min (ref >90)
GFR calc non Af Amer: >90 mL/min (ref >90)
GLUCOSE: 114 mg/dL — AB (ref 70–99)
Potassium: 4.3 mEq/L (ref 3.7–5.3)
Sodium: 140 mEq/L (ref 137–147)

## 2014-01-04 LAB — CBC
HCT: 30.5 % — ABNORMAL LOW (ref 36.0–46.0)
Hemoglobin: 9.8 g/dL — ABNORMAL LOW (ref 12.0–15.0)
MCH: 31.3 pg (ref 26.0–34.0)
MCHC: 32.1 g/dL (ref 30.0–36.0)
MCV: 97.4 fL (ref 78.0–100.0)
Platelets: 271 10*3/uL (ref 150–400)
RBC: 3.13 MIL/uL — AB (ref 3.87–5.11)
RDW: 13 % (ref 11.5–15.5)
WBC: 6.8 10*3/uL (ref 4.0–10.5)

## 2014-01-04 NOTE — Clinical Social Work Note (Signed)
Per chart review, pt to be discharged home. CSW discussed discharge disposition with pt's RN and RNCM. CSW signing off. Thank you for the consult.  Lubertha Sayres, [place] [phone]) Licensed Clinical Social Worker Orthopedics [phone]) and Surgical [phone])

## 2014-01-04 NOTE — Discharge Summary (Signed)
Joni Fears, MD   Biagio Borg, PA-C [address], [place], [place]  [address]                             [phone]  PATIENT ID: KRIZIA FLIGHT        MRN:  [id number]          DOB/AGE: Jul 06, 1950 / 63 y.o.    DISCHARGE SUMMARY  ADMISSION DATE:    01/01/2014 DISCHARGE DATE:   01/04/2014   ADMISSION DIAGNOSIS: LEFT HIP PRIMARY OSTEOARTHRITIS    DISCHARGE DIAGNOSIS:  LEFT HIPPRIMARY OSTEOARTHRITIS    ADDITIONAL DIAGNOSIS: Principal Problem:   Primary osteoarthritis of left hip Active Problems:   S/P total hip arthroplasty  Past Medical History  Diagnosis Date  . Complication of anesthesia     "stopped breathing during procedure and dizziness after procedures"  . Arthritis   . Hypothyroidism   . Hypercholesterolemia   . Ulcerative colitis   . Chronic back pain   . Cancer     right breast  . Pneumonia   . Carpal tunnel syndrome     PROCEDURE: Procedure(s): LEFT TOTAL HIP ARTHROPLASTY  on 01/01/2014  CONSULTS: none     HISTORY: Kamaryn is a very pleasant 63 year old white female who is seen today for followup of her left hip pain. Shehas had a MRI scan of the left hip which did reveal fairly significant inflammatory arthritis of the left hip with also secondary iliopsoas bursitis. She has had prominent subcortical cysts in the anterior aspect of the acetabulum and in the medial aspect of the femoral head with intense edema in the left femoral head and neck. There was a left hip effusion noted. The hip was injected by Dr. Ernestina Patches and she states that she really did not get much of a benefit from that in the long run. MRI of the lumbar spine was also performed looking for possible other reasons and this was really not much of significant. There was some minor bulging and no evidence of nerve root or spinal stenosis, just some mild facet hypertrophy. She has now gotten to the point where she is walking with a cane because of severe pain and discomfort. Her pain is  in the anterior groin especially when walking and she also has pain in and around the trochanteric region and a little bit in the buttock area. Again, the majority of her pain is in her groin. She has been tried with conservative treatment for this certainly with injections but is still not having much benefit  HOSPITAL COURSE:  ARLIE POSCH is a 63 y.o. admitted on 01/01/2014 and found to have a diagnosis of [place].  After appropriate laboratory studies were obtained  they were taken to the operating room on 01/01/2014 and underwent  Procedure(s): LEFT TOTAL HIP ARTHROPLASTY  .   They were given perioperative antibiotics:  Anti-infectives    Start     Dose/Rate Route Frequency Ordered Stop   01/01/14 1930  vancomycin (VANCOCIN) IVPB 1000 mg/200 mL premix     1,000 mg200 mL/hr over 60 Minutes Intravenous Every 12 hours 01/01/14 1150 01/01/14 1942   01/01/14 0600  ceFAZolin (ANCEF) IVPB 2 g/50 mL premix     2 g100 mL/hr over 30 Minutes Intravenous On call to O.R. 12/31/13 1515 01/01/14 0750    .  Tolerated the procedure well.  Placed with a foley intraoperatively.  Toradol was given post op.  POD #1, allowed out of bed to a chair.  PT for ambulation and exercise program.  Foley D/C'd in morning.  IV saline locked.  O2 discontionued.  POD #2, continued PT and ambulation. She had difficulty with presyncopal episodes and medications were changed with d/c of oxycodone and robaxin changed to norco and flexeril. Symptoms improve . POD #3, had a good night. Did well and wanted to go home  The remainder of the hospital course was dedicated to ambulation and strengthening.   The patient was discharged on 3 Days Post-Op in  Stable condition.  Blood products given:none  DIAGNOSTIC STUDIES: Recent vital signs:  Patient Vitals for the past 24 hrs:  BP Temp Temp src Pulse Resp SpO2  01/04/14 0535 (!) 113/55 mmHg 98.8 F (37.1 C) Oral 90 15 97 %  01/04/14 0400 - - - - 12  97 %  01/04/14 0000 - - - - 16 98 %  01/03/14 2000 - - - - 16 98 %  01/03/14 1925 (!) 128/59 mmHg 98.6 F (37 C) Oral (!) 114 17 97 %  01/03/14 1558 - 99 F (37.2 C) - - 18 100 %  01/03/14 0956 - - - 98 - -  01/03/14 0844 109/61 mmHg 98.6 F (37 C) Oral 87 - 100 %       Recent laboratory studies:  Recent Labs  01/02/14 0607 01/03/14 0701 01/04/14 0701  WBC 6.8 8.1 6.8  HGB 9.4* 9.5* 9.8*  HCT 28.4* 29.3* 30.5*  PLT 252 240 271    Recent Labs  01/02/14 0607 01/03/14 0701  NA 140 140  K 3.9 3.8  CL 106 102  CO2 26 26  BUN 7 8  CREATININE 0.61 0.62  GLUCOSE 98 125*  CALCIUM 8.5 8.8   Lab Results  Component Value Date   INR 1.03 12/21/2013   INR 1.01 03/07/2009     Recent Radiographic Studies :  Chest 2 View  12/21/2013   CLINICAL DATA:  63 year old female under preoperative evaluation prior to left total hip arthroplasty scheduled for 01/01/2014. No current chest complaints. History right-sided breast cancer. Prior history of smoking (quit 32 years ago).  EXAM: CHEST  2 VIEW  COMPARISON:  Chest x-Tomaszewski 03/07/2009.  FINDINGS: Lung volumes are normal. No consolidative airspace disease. No pleural effusions. No pneumothorax. No pulmonary nodule or mass noted. Pulmonary vasculature and the cardiomediastinal silhouette are within normal limits. Surgical clips project over the right upper quadrant of the abdomen, presumably from prior cholecystectomy. Multiple surgical clips in the right breast and right axillary region, suggesting prior lumpectomy and axillary nodal dissection.  IMPRESSION: 1. No radiographic evidence of acute cardiopulmonary disease.   Electronically Signed   By: [name] M.D.   On: 12/21/2013 16:29   Dg Pelvis Portable  01/01/2014   CLINICAL DATA:  Status post left total hip arthroplasty.  EXAM: PORTABLE PELVIS 1-2 VIEWS  COMPARISON:  March 13, 2009.  FINDINGS: Status post bilateral total hip arthroplasties, most recently on the left. The femoral  and acetabular components appear to be well situated. No acute fracture or dislocation is noted.  IMPRESSION: Status post bilateral total hip arthroplasties, most recently on the left.   Electronically Signed   By: Sabino Dick M.D.   On: 01/01/2014 10:48   Dg Hip Portable 1 View Left  01/01/2014   CLINICAL DATA:  Postoperative state.  EXAM: PORTABLE LEFT HIP - 1 VIEW  COMPARISON:  None.  FINDINGS:  Status post left total hip arthroplasty. The femoral and acetabular components appear to be in grossly good position. No fracture or dislocation is noted.  IMPRESSION: Status post left total hip arthroplasty.   Electronically Signed   By: Sabino Dick M.D.   On: 01/01/2014 13:24    DISCHARGE INSTRUCTIONS:     Discharge Instructions    Call MD / Call 911    Complete by:  As directed   If you experience chest pain or shortness of breath, CALL 911 and be transported to the hospital emergency room.  If you develop a fever above 101 F, pus (white drainage) or increased drainage or redness at the wound, or calf pain, call your surgeon's office.     Change dressing    Complete by:  As directed   DO NOT TOUCH DRESSING     Constipation Prevention    Complete by:  As directed   Drink plenty of fluids.  Prune juice and/or coffee may be helpful.  You may use a stool softener, such as Colace (over the counter) 100 mg twice a day.  Use MiraLax (over the counter) for constipation as needed but this may take several days to work.  Mag Citrate --OR-- Milk of Magnesia --OR -- Dulcolax pills/suppositories may also be used but follow directions on the label.     Diet general    Complete by:  As directed      Discharge instructions    Complete by:  As directed   YOU WERE GIVEN A DEVICE CALLED AN INCENTIVE SPIROMETER TO HELP YOU TAKE DEEP BREATHS.  PLEASE USE THIS AT LEAST TEN (10) TIMES EVERY 1-2 HOURS EVERY DAY TO PREVENT PNEUMONIA.     Driving restrictions    Complete by:  As directed   No driving for 6 weeks      Follow the hip precautions as taught in Physical Therapy    Complete by:  As directed      Increase activity slowly as tolerated    Complete by:  As directed      Lifting restrictions    Complete by:  As directed   No lifting for 6 weeks     Patient may shower    Complete by:  As directed   You may shower over the brown dressing.     TED hose    Complete by:  As directed   Use stockings (TED hose) for 1-2 weeks on operative leg(s).  You may remove them at night for sleeping. May stop the NON-operative leg stocking when you go home.     Weight bearing as tolerated    Complete by:  As directed            DISCHARGE MEDICATIONS:     Medication List    STOP taking these medications        aspirin 81 MG tablet     Cinnamon 500 MG capsule     Fish Oil 1360 MG Caps     GLUCOSAMINE-CHONDROITIN PO     TURMERIC PO     Ubiquinol 100 MG Caps     VICODIN 5-500 MG per tablet  Generic drug:  HYDROcodone-acetaminophen  Replaced by:  HYDROcodone-acetaminophen 5-325 MG per tablet      TAKE these medications        Calcium + D3 600-200 MG-UNIT Tabs  Take 1 tablet by mouth daily.     cyclobenzaprine 5 MG tablet  Commonly known as:  FLEXERIL  Take  5 mg by mouth 3 (three) times daily as needed for muscle spasms.     folic acid 848 MCG tablet  Commonly known as:  FOLVITE  Take 400 mcg by mouth daily.     HYDROcodone-acetaminophen 5-325 MG per tablet  Commonly known as:  NORCO/VICODIN  Take 0.5-1 tablets by mouth every 4 (four) hours as needed for moderate pain or severe pain.     levothyroxine 150 MCG tablet  Commonly known as:  SYNTHROID, LEVOTHROID  Take 150 mcg by mouth daily before breakfast.     multivitamin with minerals tablet  Take 1 tablet by mouth daily.     rivaroxaban 10 MG Tabs tablet  Commonly known as:  XARELTO  Take 1 tablet (10 mg total) by mouth daily with supper.     simvastatin 10 MG tablet  Commonly known as:  ZOCOR  Take 10 mg by mouth daily.       sulfaSALAzine 500 MG EC tablet  Commonly known as:  AZULFIDINE  Take 1,000 mg by mouth 2 (two) times daily.     vitamin B-12 1000 MCG tablet  Commonly known as:  CYANOCOBALAMIN  Take 1,000 mcg by mouth daily.        FOLLOW UP VISIT:   Follow-up Information    Follow up with [facility].   Why:  Someone from [facility] will contact you concerning start date and time for physical therapy.   Contact information:   [address] [place] [address] [phone]       Follow up with Garald Balding, MD. Schedule an appointment as soon as possible for a visit on 01/14/2014.   Specialty:  Orthopedic Surgery   Contact information:   [address] [place] [address] [phone]       DISPOSITION:   Home  CONDITION:  Stable   Mike Craze. [place], [place] [phone]  01/04/2014 7:55 AM

## 2014-01-04 NOTE — Progress Notes (Signed)
Physical Therapy Treatment Patient Details Name: Stephanie Ramsey MRN: [id number] DOB: 1951/01/02 Today's Date: 01/04/2014    History of Present Illness Pt admitted for Left THA posterior approach. Pt with prior R THA 2011, bil carpal tunnel release    PT Comments    Pt is progressing back on track with her mobility today.  She has no reports of lightheadedness and was able to demonstrate both stairs and progressive gait with min guard and min assist overall with walker.  Daughter present for stair education and pt's HEP exercises were reviewed. She is planning d/c later today and would still benefit from HHPT at discharge.   Follow Up Recommendations  Home health PT;Supervision for mobility/OOB     Equipment Recommendations  None recommended by PT    Recommendations for Other Services   NA     Precautions / Restrictions Precautions Precautions: Posterior Hip;Fall Precaution Comments: Pt able to report 3/3 posterior hip precautions.  Restrictions LLE Weight Bearing: Weight bearing as tolerated    Mobility  Bed Mobility Overal bed mobility: Needs Assistance Bed Mobility: Supine to Sit     Supine to sit: Min assist     General bed mobility comments: Min assist to help progress left leg to EOB.  Pt reports just doing her "warm up" exercies (ankle pumps, quad sets, and heel slides)_before I came in and she is ready to move.  She required much less assist (light min assist)  today.  Transfers Overall transfer level: Needs assistance Equipment used: Rolling walker (2 wheeled) Transfers: Sit to/from Stand Sit to Stand: Supervision         General transfer comment: Supervision for safety and verbal cues for safe hand placement.  Cues and manual/tactile cues that it was ok to sit with some hip flexion and not fall stright back with hip and trunk straight.    Ambulation/Gait Ambulation/Gait assistance: Min guard Ambulation Distance (Feet): 120 Feet Assistive device: Rolling  walker (2 wheeled) Gait Pattern/deviations: Step-through pattern (decreased left hip extension with increased knee flex stance) Gait velocity: decreased Gait velocity interpretation: Below normal speed for age/gender General Gait Details: Encouraged step through, heel to toe gait pattern, but pt having difficulty extending her left hip as well as having knee extended in terminal stance.    Stairs Stairs: Yes Stairs assistance: Min guard Stair Management: One rail Right;One rail Left;Step to pattern;Forwards;With cane Number of Stairs: 4 (2 x 2) General stair comments: Pt was able to demonstrate safety on stairs with cane and HH assist from daughter simulating home entry (verbal cues for safe guarding technique and correct leg sequencing).  And simulating her split level stairs with railing and cane.  Both required min guard assist.          Balance Overall balance assessment: Needs assistance Sitting-balance support: Feet supported;No upper extremity supported Sitting balance-Leahy Scale: Good     Standing balance support: Bilateral upper extremity supported;No upper extremity supported;Single extremity supported Standing balance-Leahy Scale: Good Standing balance comment: static standing is good, dyanmic still requires external assit.                     Cognition Arousal/Alertness: Awake/alert Behavior During Therapy: WFL for tasks assessed/performed Overall Cognitive Status: Within Functional Limits for tasks assessed                      Exercises Total Joint Exercises Ankle Circles/Pumps: AROM;Both;10 reps;Supine Quad Sets: AROM;Left;10 reps;Supine Short Arc Quad: AAROM;Left;10 reps;Supine  Heel Slides: AAROM;Left;10 reps;Supine Hip ABduction/ADduction: AAROM;Left;10 reps;Supine        Pertinent Vitals/Pain Pain Assessment: 0-10 Pain Score: 3  Pain Location: left hip Pain Descriptors / Indicators: Aching Pain Intervention(s): Limited activity  within patient's tolerance;Monitored during session;Repositioned           PT Goals (current goals can now be found in the care plan section) Acute Rehab PT Goals Patient Stated Goal: to get back to hiking again Progress towards PT goals: Progressing toward goals    Frequency  7X/week    PT Plan Discharge plan needs to be updated       End of Session Equipment Utilized During Treatment: Gait belt Activity Tolerance: Patient limited by pain Patient left: in chair;with call bell/phone within reach;with family/visitor present     Time: 8209-9068 PT Time Calculation (min) (ACUTE ONLY): 33 min  Charges:  $Gait Training: 23-37 mins                      Latrease Kunde B. Terry Bolotin, PT, DPT [phone]   01/04/2014, 4:56 PM

## 2014-01-04 NOTE — Progress Notes (Signed)
Patient ID: Stephanie Ramsey, female   DOB: [birth year], 63 y.o.   MRN: [id number] PATIENT ID: Stephanie Ramsey        MRN:  [id number]          DOB/AGE: [birth year] / 63 y.o.    Joni Fears, MD   Biagio Borg, PA-C [address] [place], [place]  [address]                             [phone]   PROGRESS NOTE  Subjective:  negative for Chest Pain  negative for Shortness of Breath  negative for Nausea/Vomiting   negative for Calf Pain    Tolerating Diet: yes         Patient reports pain as mild.     Much better night-not feeling dizzy or nauseated-ready for D/C  Objective: Vital signs in last 24 hours:   Patient Vitals for the past 24 hrs:  BP Temp Temp src Pulse Resp SpO2  01/04/14 0535 (!) 113/55 mmHg 98.8 F (37.1 C) Oral 90 15 97 %  01/04/14 0400 - - - - 12 97 %  01/04/14 0000 - - - - 16 98 %  01/03/14 2000 - - - - 16 98 %  01/03/14 1925 (!) 128/59 mmHg 98.6 F (37 C) Oral (!) 114 17 97 %  01/03/14 1558 - 99 F (37.2 C) - - 18 100 %  01/03/14 0956 - - - 98 - -  01/03/14 0844 109/61 mmHg 98.6 F (37 C) Oral 87 - 100 %      Intake/Output from previous day:   11/19 0701 - 11/20 0700 In: 920 [P.O.:920] Out: -    Intake/Output this shift:       Intake/Output      11/19 0701 - 11/20 0700 11/20 0701 - 11/21 0700   P.O. 920    I.V. (mL/kg)     IV Piggyback     Total Intake(mL/kg) 920 (12.8)    Net +920          Urine Occurrence 6 x       LABORATORY DATA:  Recent Labs  01/02/14 0607 01/03/14 0701 01/04/14 0701  WBC 6.8 8.1 6.8  HGB 9.4* 9.5* 9.8*  HCT 28.4* 29.3* 30.5*  PLT 252 240 271    Recent Labs  01/02/14 0607 01/03/14 0701  NA 140 140  K 3.9 3.8  CL 106 102  CO2 26 26  BUN 7 8  CREATININE 0.61 0.62  GLUCOSE 98 125*  CALCIUM 8.5 8.8   Lab Results  Component Value Date   INR 1.03 12/21/2013   INR 1.01 03/07/2009    Recent Radiographic Studies :  Chest 2 View  12/21/2013   CLINICAL DATA:  63 year old female under preoperative  evaluation prior to left total hip arthroplasty scheduled for 01/01/2014. No current chest complaints. History right-sided breast cancer. Prior history of smoking (quit 32 years ago).  EXAM: CHEST  2 VIEW  COMPARISON:  Chest x-Omar 03/07/2009.  FINDINGS: Lung volumes are normal. No consolidative airspace disease. No pleural effusions. No pneumothorax. No pulmonary nodule or mass noted. Pulmonary vasculature and the cardiomediastinal silhouette are within normal limits. Surgical clips project over the right upper quadrant of the abdomen, presumably from prior cholecystectomy. Multiple surgical clips in the right breast and right axillary region, suggesting prior lumpectomy and axillary nodal dissection.  IMPRESSION: 1. No radiographic evidence of acute cardiopulmonary disease.   Electronically  Signed   By: [name] M.D.   On: 12/21/2013 16:29   Dg Pelvis Portable  01/01/2014   CLINICAL DATA:  Status post left total hip arthroplasty.  EXAM: PORTABLE PELVIS 1-2 VIEWS  COMPARISON:  March 13, 2009.  FINDINGS: Status post bilateral total hip arthroplasties, most recently on the left. The femoral and acetabular components appear to be well situated. No acute fracture or dislocation is noted.  IMPRESSION: Status post bilateral total hip arthroplasties, most recently on the left.   Electronically Signed   By: Sabino Dick M.D.   On: 01/01/2014 10:48   Dg Hip Portable 1 View Left  01/01/2014   CLINICAL DATA:  Postoperative state.  EXAM: PORTABLE LEFT HIP - 1 VIEW  COMPARISON:  None.  FINDINGS: Status post left total hip arthroplasty. The femoral and acetabular components appear to be in grossly good position. No fracture or dislocation is noted.  IMPRESSION: Status post left total hip arthroplasty.   Electronically Signed   By: Sabino Dick M.D.   On: 01/01/2014 13:24     Examination:  General appearance: alert, cooperative and no distress  Wound Exam: clean, dry, intact   Drainage:  None: wound  tissue dry  Motor Exam: EHL, FHL, Anterior Tibial and Posterior Tibial Intact  Sensory Exam: Superficial Peroneal, Deep Peroneal and Tibial normal  Vascular Exam: Normal  Assessment:    3 Days Post-Op  Procedure(s) (LRB): LEFT TOTAL HIP ARTHROPLASTY (Left)  ADDITIONAL DIAGNOSIS:  Principal Problem:   Primary osteoarthritis of left hip Active Problems:   S/P total hip arthroplasty  Acute Blood Loss Anemia-asymptomatic, improved   Plan: Physical Therapy as ordered Weight Bearing as Tolerated (WBAT)  DVT Prophylaxis:  Xarelto, Foot Pumps and TED hose  DISCHARGE PLAN: Home  DISCHARGE NEEDS: HHPT, CPM, Walker and 3-in-1 comode seat    Dressing dry left hip, good effort in PT, will D/C today    Kalan Yeley W  01/04/2014 7:55 AM

## 2015-07-26 DIAGNOSIS — M8580 Other specified disorders of bone density and structure, unspecified site: Secondary | ICD-10-CM | POA: Insufficient documentation

## 2015-07-26 DIAGNOSIS — C50919 Malignant neoplasm of unspecified site of unspecified female breast: Secondary | ICD-10-CM | POA: Insufficient documentation

## 2015-07-26 DIAGNOSIS — E785 Hyperlipidemia, unspecified: Secondary | ICD-10-CM | POA: Insufficient documentation

## 2015-07-26 DIAGNOSIS — K5190 Ulcerative colitis, unspecified, without complications: Secondary | ICD-10-CM | POA: Insufficient documentation

## 2015-07-26 DIAGNOSIS — M62838 Other muscle spasm: Secondary | ICD-10-CM | POA: Insufficient documentation

## 2015-07-26 DIAGNOSIS — L850 Acquired ichthyosis: Secondary | ICD-10-CM | POA: Insufficient documentation

## 2015-07-26 DIAGNOSIS — E039 Hypothyroidism, unspecified: Secondary | ICD-10-CM | POA: Insufficient documentation

## 2015-07-26 DIAGNOSIS — M7989 Other specified soft tissue disorders: Secondary | ICD-10-CM | POA: Insufficient documentation

## 2015-07-26 DIAGNOSIS — R229 Localized swelling, mass and lump, unspecified: Secondary | ICD-10-CM | POA: Insufficient documentation

## 2015-07-26 DIAGNOSIS — M2550 Pain in unspecified joint: Secondary | ICD-10-CM | POA: Insufficient documentation

## 2015-07-26 DIAGNOSIS — R7301 Impaired fasting glucose: Secondary | ICD-10-CM | POA: Insufficient documentation

## 2015-07-26 DIAGNOSIS — D0510 Intraductal carcinoma in situ of unspecified breast: Secondary | ICD-10-CM | POA: Insufficient documentation

## 2015-07-26 DIAGNOSIS — D709 Neutropenia, unspecified: Secondary | ICD-10-CM | POA: Insufficient documentation

## 2015-08-11 DIAGNOSIS — Z0000 Encounter for general adult medical examination without abnormal findings: Secondary | ICD-10-CM | POA: Insufficient documentation

## 2015-11-08 IMAGING — CR DG HIP 1V PORT*L*
1 series · 1 of 1 positions shown · non-contrast
Comparison: None.

CLINICAL DATA: Postoperative state.

EXAM:
PORTABLE LEFT HIP - 1 VIEW

[lateral]
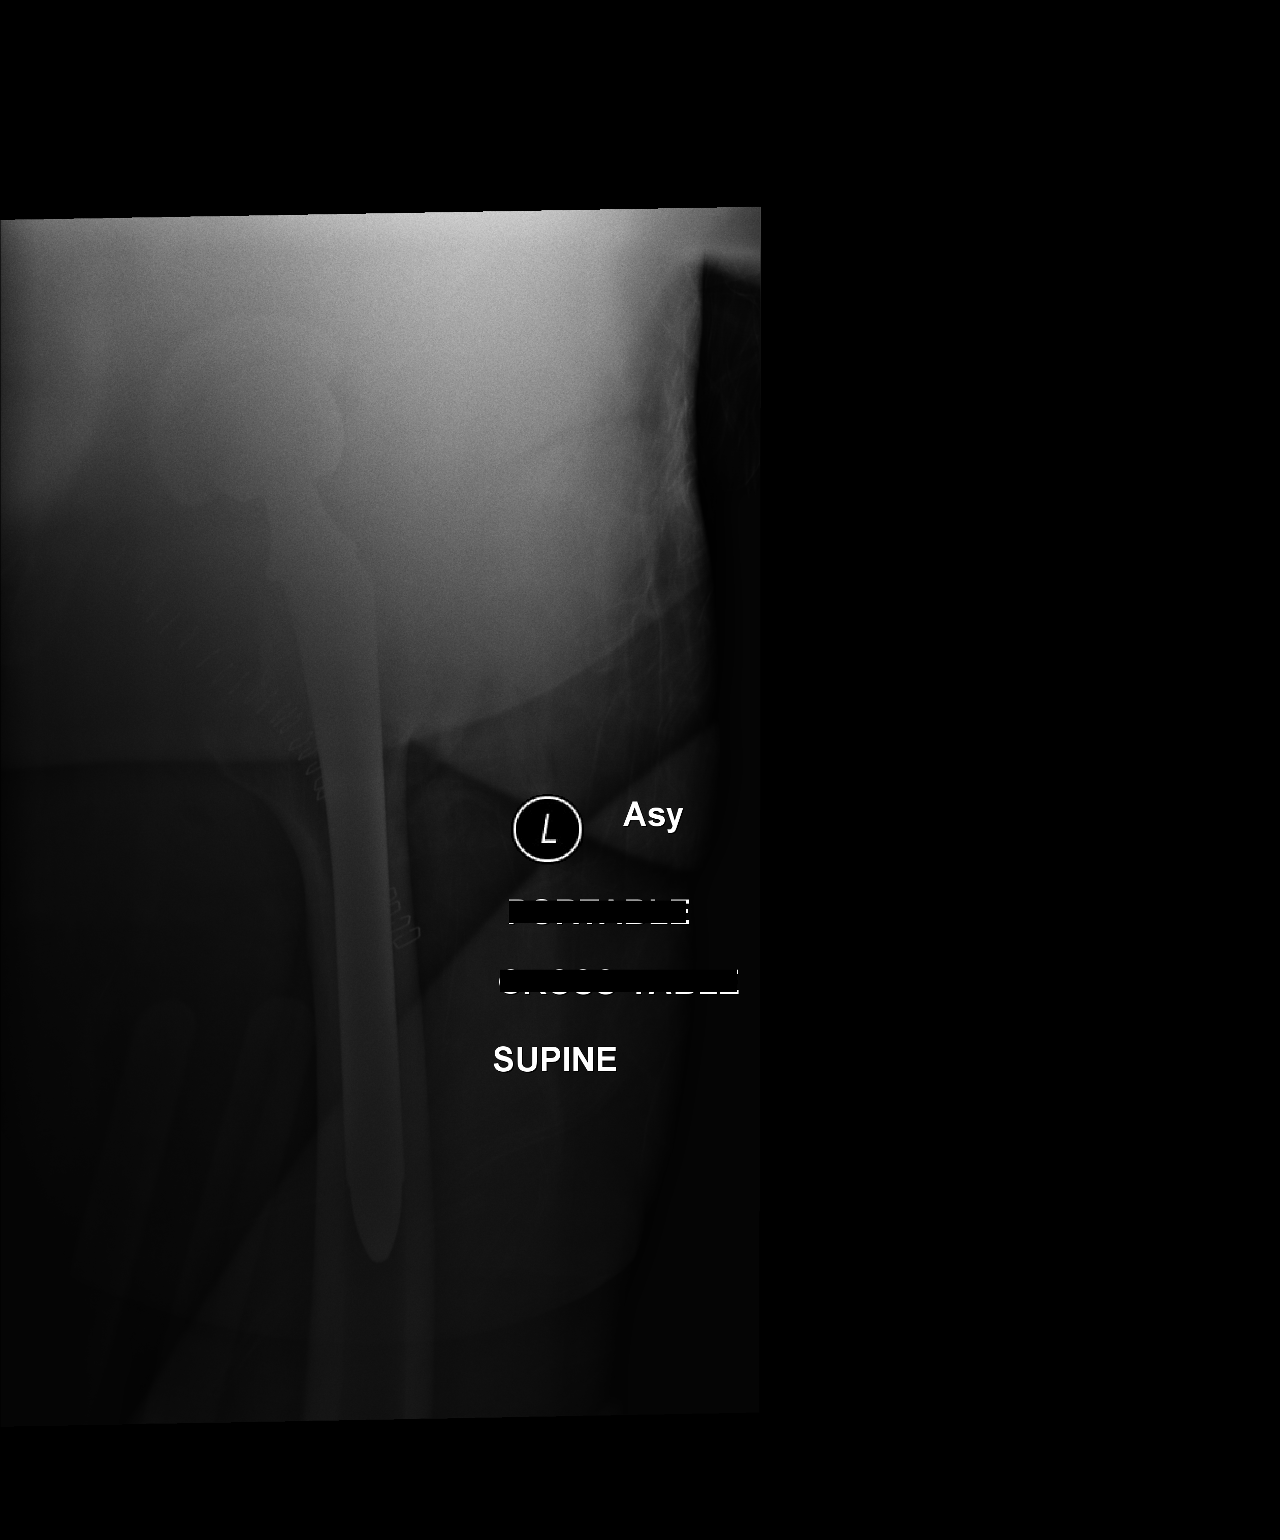

[1 of 1 positions shown; findings below may reference images not displayed]

FINDINGS: Status post left total hip arthroplasty. The femoral and acetabular
components appear to be in grossly good position. No fracture or
dislocation is noted.
IMPRESSION: Status post left total hip arthroplasty.

## 2015-11-08 IMAGING — CR DG PORTABLE PELVIS
2 series · 2 of 2 positions shown · non-contrast
Comparison: March 13, 2009.

CLINICAL DATA: Status post left total hip arthroplasty.

EXAM:
PORTABLE PELVIS 1-2 VIEWS

[AP (1 of 2)]
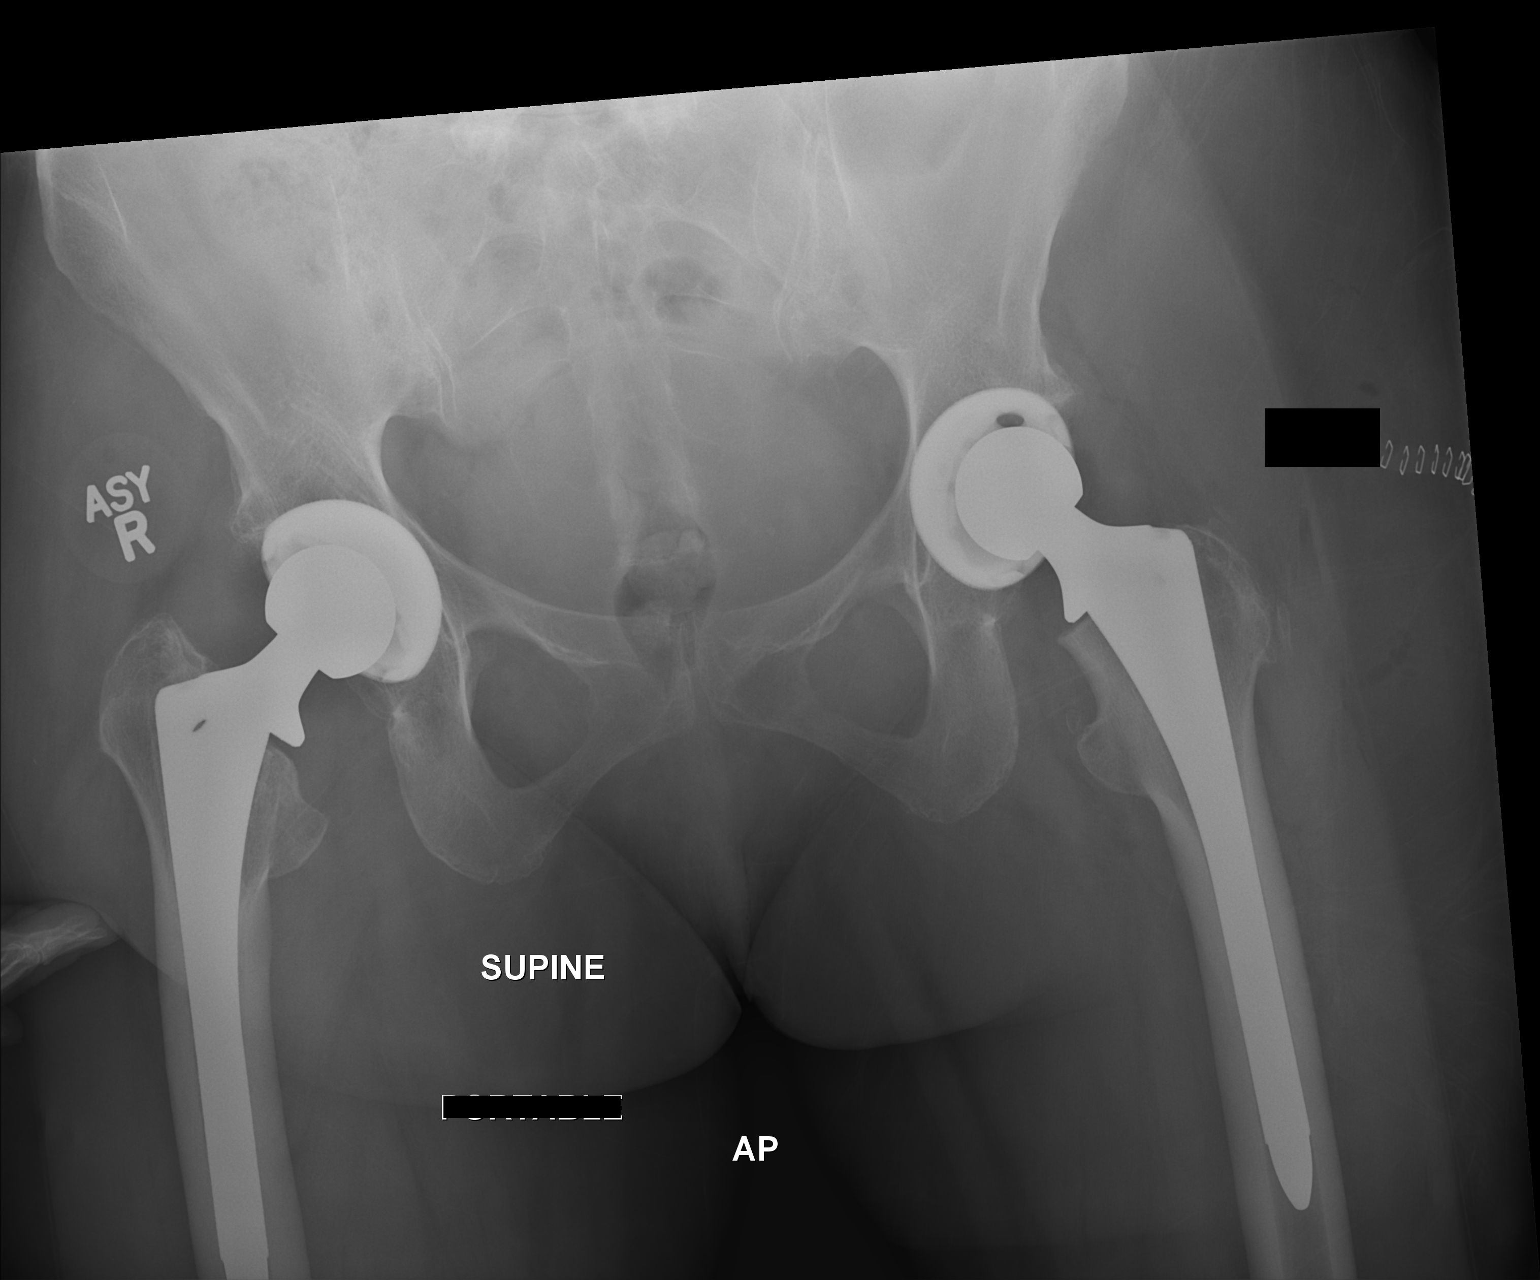

[AP (2 of 2)]
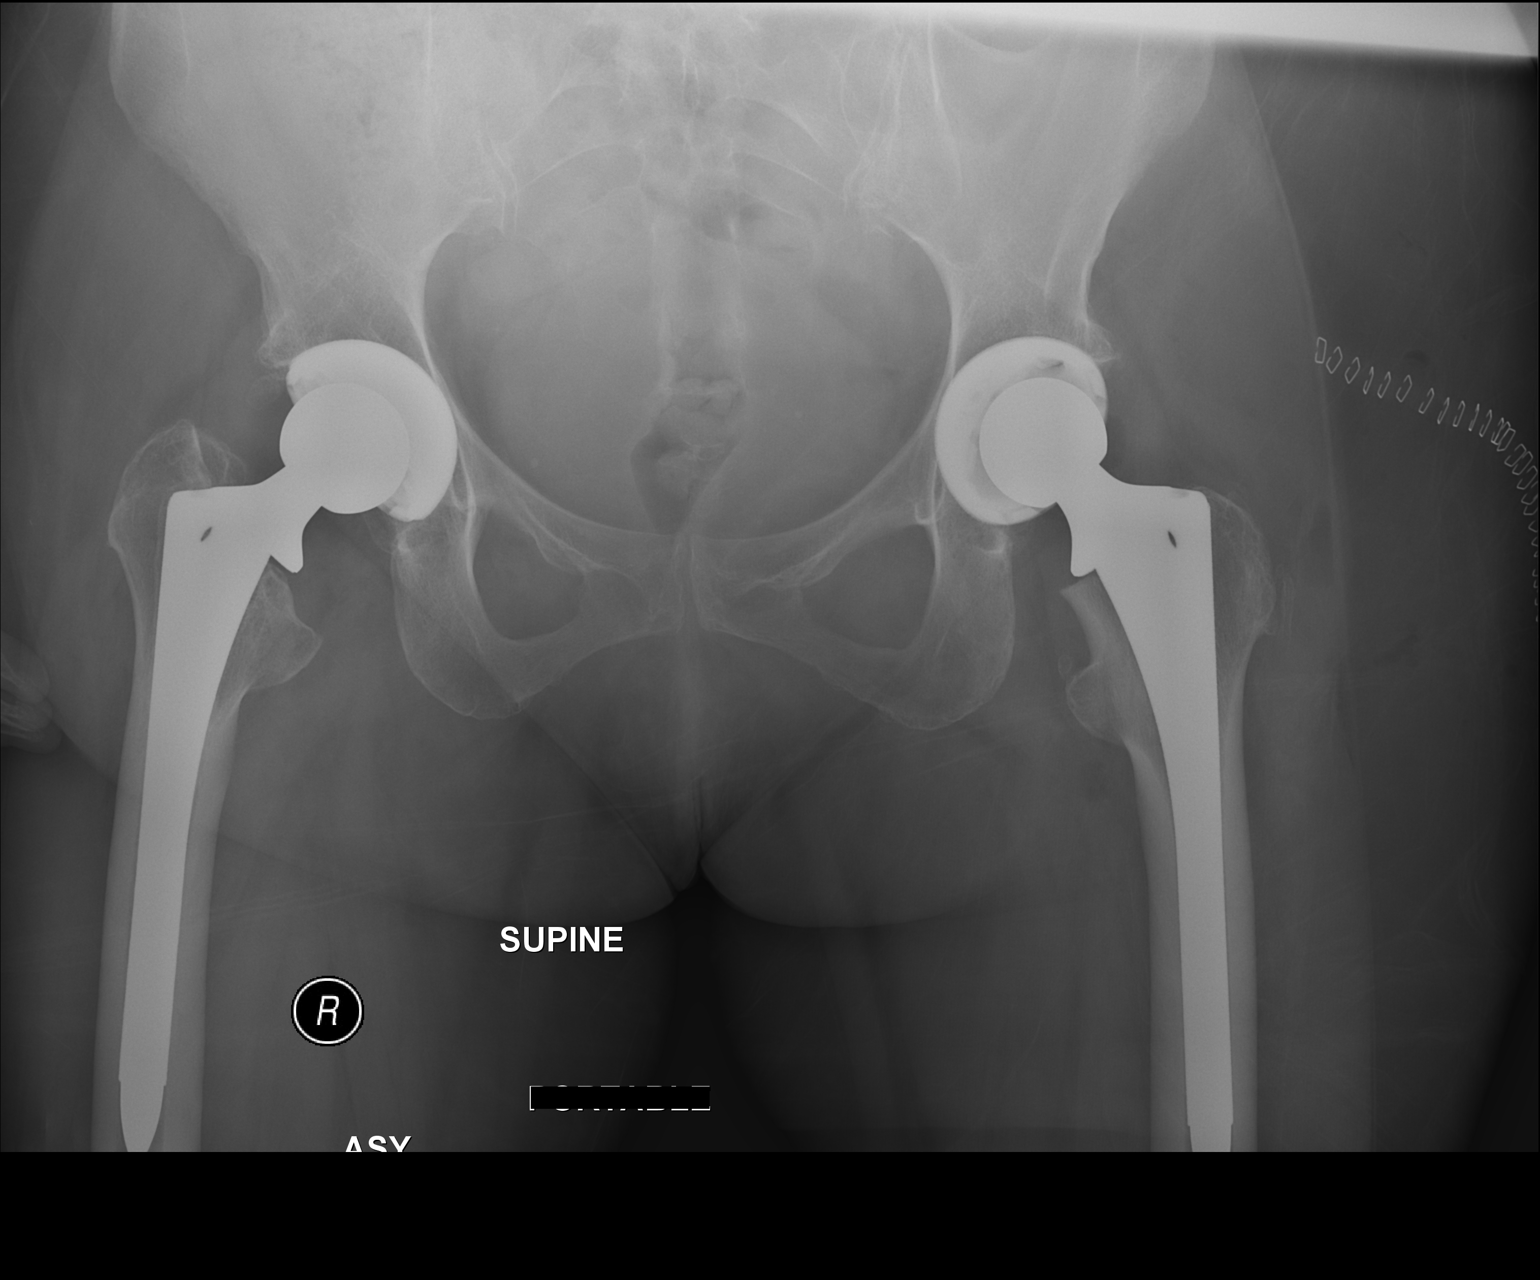

[2 of 2 positions shown; findings below may reference images not displayed]

FINDINGS: Status post bilateral total hip arthroplasties, most recently on the
left. The femoral and acetabular components appear to be well
situated. No acute fracture or dislocation is noted.
IMPRESSION: Status post bilateral total hip arthroplasties, most recently on the
left.

## 2016-06-02 DIAGNOSIS — R252 Cramp and spasm: Secondary | ICD-10-CM | POA: Insufficient documentation

## 2016-09-15 ENCOUNTER — Telehealth (INDEPENDENT_AMBULATORY_CARE_PROVIDER_SITE_OTHER): Payer: Self-pay | Admitting: Orthopaedic Surgery

## 2016-09-15 NOTE — Telephone Encounter (Signed)
[facility] records faxed to D. [facility]. Also copy of records ready at front desk for patient to pickup

## 2017-11-16 DIAGNOSIS — M65341 Trigger finger, right ring finger: Secondary | ICD-10-CM | POA: Insufficient documentation

## 2018-01-26 DIAGNOSIS — Z86010 Personal history of colonic polyps: Secondary | ICD-10-CM | POA: Insufficient documentation

## 2018-03-31 ENCOUNTER — Encounter: Payer: Self-pay | Admitting: Family Medicine

## 2018-03-31 ENCOUNTER — Ambulatory Visit (INDEPENDENT_AMBULATORY_CARE_PROVIDER_SITE_OTHER): Payer: Medicare Other | Admitting: Family Medicine

## 2018-03-31 DIAGNOSIS — K5180 Other ulcerative colitis without complications: Secondary | ICD-10-CM | POA: Diagnosis not present

## 2018-03-31 DIAGNOSIS — M65341 Trigger finger, right ring finger: Secondary | ICD-10-CM

## 2018-03-31 DIAGNOSIS — E039 Hypothyroidism, unspecified: Secondary | ICD-10-CM

## 2018-03-31 DIAGNOSIS — E785 Hyperlipidemia, unspecified: Secondary | ICD-10-CM

## 2018-03-31 NOTE — Assessment & Plan Note (Signed)
-  Stable, follows with GI.

## 2018-03-31 NOTE — Assessment & Plan Note (Signed)
Stable, update TSH at annual exam in April.

## 2018-03-31 NOTE — Assessment & Plan Note (Signed)
-  Trial of pennsaid to area to see if this provides some pain relief.  Has upcoming surgery to have this repaired.

## 2018-03-31 NOTE — Progress Notes (Signed)
Stephanie Ramsey - 68 y.o. female MRN [id number]  Date of birth: 1950-04-21  Subjective Chief Complaint  Patient presents with  . Hand Pain    one month-has been taking Duexsis    HPI Stephanie Ramsey is a 68 y.o. female with history of ulcerative colitis, hypothyroidism, HLD, and breast cancer here today for initial visit with new PCP.  She is followed by Dr. Earlean Shawl who manages her UC.  This is stable currently with sulfasalazine.  She has colonoscopy q2 years.  She is followed by [place] for orthopedic issues including trigger finger of her R hand.  She has tried injection without improvement and is taking duexis.  She is being scheduled for surgery to correct trigger finger.    TSH levels checked in 11/2017, normal.  Stable at this time.   ROS:  A comprehensive ROS was completed and negative except as noted per HPI  Allergies  Allergen Reactions  . Oxycodone Swelling and Other (See Comments)    DROPS BP  . Penicillins Rash    Past Medical History:  Diagnosis Date  . Arthritis   . Cancer ([place])    right breast  . Carpal tunnel syndrome   . Chronic back pain   . Complication of anesthesia    "stopped breathing during procedure and dizziness after procedures"  . Hypercholesterolemia   . Hypothyroidism   . Pneumonia   . Ulcerative colitis [facility])     Past Surgical History:  Procedure Laterality Date  . BILATERAL CARPAL TUNNEL RELEASE    . BREAST SURGERY     biopsy and lumpectomy right breast  . CHOLECYSTECTOMY    . COLONOSCOPY W/ BIOPSIES AND POLYPECTOMY    . JOINT REPLACEMENT     Total right hip  . 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   Procedure: LEFT TOTAL HIP ARTHROPLASTY;  Surgeon: Garald Balding, MD;  Location: [place];  Service: Orthopedics;  Laterality: Left;  . TRIGGER FINGER RELEASE      Social History   Socioeconomic History  . Marital status: Married    Spouse name: Not on file  . Number of children: Not on file  . Years of education: Not on file  .  Highest education level: Not on file  Occupational History  . Not on file  Social Needs  . Financial resource strain: Not on file  . Food insecurity:    Worry: Not on file    Inability: Not on file  . Transportation needs:    Medical: Not on file    Non-medical: Not on file  Tobacco Use  . Smoking status: Former Smoker    Types: Cigarettes  . Smokeless tobacco: Never Used  . Tobacco comment: quit smoking cigarettes 32 years ago  Substance and Sexual Activity  . Alcohol use: Yes    Comment: social  . Drug use: No  . Sexual activity: Not on file  Lifestyle  . Physical activity:    Days per week: Not on file    Minutes per session: Not on file  . Stress: Not on file  Relationships  . Social connections:    Talks on phone: Not on file    Gets together: Not on file    Attends religious service: Not on file    Active member of club or organization: Not on file    Attends meetings of clubs or organizations: Not on file    Relationship status: Not on file  Other Topics Concern  . Not  on file  Social History Narrative  . Not on file    Family History  Problem Relation Age of Onset  . Breast cancer Mother   . Arthritis Other     Health Maintenance  Topic Date Due  . Hepatitis C Screening  05-Nov-1950  . TETANUS/TDAP  12/11/1968  . MAMMOGRAM  12/12/1999  . DEXA SCAN  12/12/2014  . PNA vac Low Risk Adult (1 of 2 - PCV13) 01/03/2015  . COLONOSCOPY  01/16/2028  . INFLUENZA VACCINE  Completed    ----------------------------------------------------------------------------------------------------------------------------------------------------------------------------------------------------------------- Physical Exam BP 118/66   Pulse 67   Temp 98.1 F (36.7 C) (Oral)   Ht 5' 3.5" (1.613 m)   Wt 149 lb (67.6 kg)   SpO2 95%   BMI 25.98 kg/m   Physical Exam Constitutional:      Appearance: Normal appearance.  HENT:     Head: Normocephalic and atraumatic.      Nose: Nose normal.     Mouth/Throat:     Mouth: Mucous membranes are dry.  Eyes:     General: No scleral icterus. Neck:     Musculoskeletal: Neck supple.  Cardiovascular:     Rate and Rhythm: Normal rate and regular rhythm.  Pulmonary:     Effort: Pulmonary effort is normal.     Breath sounds: Normal breath sounds.  Musculoskeletal:     Comments: Tender nodule along palm of R hand at ring finger.  Triggering noted.   Skin:    General: Skin is warm and dry.  Neurological:     General: No focal deficit present.     Mental Status: She is alert.  Psychiatric:        Mood and Affect: Mood normal.        Behavior: Behavior normal.     ------------------------------------------------------------------------------------------------------------------------------------------------------------------------------------------------------------------- Assessment and Plan  Trigger ring finger of right hand -Trial of pennsaid to area to see if this provides some pain relief.  Has upcoming surgery to have this repaired.   Hypothyroidism Stable, update TSH at annual exam in April.   Ulcerative colitis ([place]) -Stable, follows with GI.   Hyperlipidemia Stable with atorvastatin, update lipid panel at upcoming annual exam.

## 2018-03-31 NOTE — Patient Instructions (Signed)
-  Great to meet you today! Try pennsaid 1/4-1/2 pack twice per day  As needed.

## 2018-03-31 NOTE — Assessment & Plan Note (Signed)
Stable with atorvastatin, update lipid panel at upcoming annual exam.

## 2018-04-06 ENCOUNTER — Encounter: Payer: Self-pay | Admitting: Family Medicine

## 2018-04-11 ENCOUNTER — Telehealth: Payer: Self-pay | Admitting: Family Medicine

## 2018-04-11 NOTE — Telephone Encounter (Signed)
Copied from [place] [id number]. Topic: Quick Communication - Rx Refill/Question >> Apr 11, 2018  8:10 AM Margot Ables wrote: Medication: gabapentin 318m 1 capsule 2/day - pt states she recently had new pt appt with Dr. MZigmund Danieland is needing refill on this medication. She has about 5-7 days on hand. Please advise if Dr. MZigmund Danielwill order for her.   Has the patient contacted their pharmacy? No - no refills & new pt to Dr. MZigmund DanielPreferred Pharmacy (with phone number or street name): [facility] [address] [address] [facility] [phone](Phone) [phone](Fax)

## 2018-04-12 ENCOUNTER — Other Ambulatory Visit: Payer: Self-pay | Admitting: Family Medicine

## 2018-04-12 MED ORDER — GABAPENTIN 300 MG PO CAPS: 300.0000 mg | ORAL_CAPSULE | Freq: Two times a day (BID) | ORAL | 1 refills | Status: DC

## 2018-04-12 NOTE — Telephone Encounter (Signed)
Rx sent in

## 2018-05-15 ENCOUNTER — Emergency Department (HOSPITAL_BASED_OUTPATIENT_CLINIC_OR_DEPARTMENT_OTHER)
Admission: EM | Admit: 2018-05-15 | Discharge: 2018-05-15 | Disposition: A | Discharge status: Home / Self Care | Payer: Medicare Other | Attending: Emergency Medicine | Admitting: Emergency Medicine

## 2018-05-15 ENCOUNTER — Ambulatory Visit: Payer: Self-pay

## 2018-05-15 ENCOUNTER — Encounter (HOSPITAL_BASED_OUTPATIENT_CLINIC_OR_DEPARTMENT_OTHER): Payer: Self-pay

## 2018-05-15 ENCOUNTER — Other Ambulatory Visit: Payer: Self-pay

## 2018-05-15 DIAGNOSIS — Y998 Other external cause status: Secondary | ICD-10-CM | POA: Insufficient documentation

## 2018-05-15 DIAGNOSIS — W540XXA Bitten by dog, initial encounter: Secondary | ICD-10-CM | POA: Insufficient documentation

## 2018-05-15 DIAGNOSIS — S71132A Puncture wound without foreign body, left thigh, initial encounter: Secondary | ICD-10-CM | POA: Insufficient documentation

## 2018-05-15 DIAGNOSIS — Z96642 Presence of left artificial hip joint: Secondary | ICD-10-CM | POA: Diagnosis not present

## 2018-05-15 DIAGNOSIS — Z79899 Other long term (current) drug therapy: Secondary | ICD-10-CM | POA: Diagnosis not present

## 2018-05-15 DIAGNOSIS — Z203 Contact with and (suspected) exposure to rabies: Secondary | ICD-10-CM | POA: Insufficient documentation

## 2018-05-15 DIAGNOSIS — Y9389 Activity, other specified: Secondary | ICD-10-CM | POA: Insufficient documentation

## 2018-05-15 DIAGNOSIS — S51831A Puncture wound without foreign body of right forearm, initial encounter: Secondary | ICD-10-CM | POA: Diagnosis not present

## 2018-05-15 DIAGNOSIS — Z23 Encounter for immunization: Secondary | ICD-10-CM | POA: Insufficient documentation

## 2018-05-15 DIAGNOSIS — Z87891 Personal history of nicotine dependence: Secondary | ICD-10-CM | POA: Diagnosis not present

## 2018-05-15 DIAGNOSIS — Y9289 Other specified places as the place of occurrence of the external cause: Secondary | ICD-10-CM | POA: Insufficient documentation

## 2018-05-15 DIAGNOSIS — E039 Hypothyroidism, unspecified: Secondary | ICD-10-CM | POA: Diagnosis not present

## 2018-05-15 DIAGNOSIS — Z853 Personal history of malignant neoplasm of breast: Secondary | ICD-10-CM | POA: Insufficient documentation

## 2018-05-15 DIAGNOSIS — S59911A Unspecified injury of right forearm, initial encounter: Secondary | ICD-10-CM | POA: Diagnosis present

## 2018-05-15 MED ORDER — DOXYCYCLINE HYCLATE 100 MG PO CAPS: 100.0000 mg | ORAL_CAPSULE | Freq: Two times a day (BID) | ORAL | 0 refills | Status: DC

## 2018-05-15 MED ORDER — ACETAMINOPHEN 325 MG PO TABS
650.0000 mg | ORAL_TABLET | ORAL | Status: DC | PRN
  Administered 2018-05-15: 650 mg via ORAL
  Filled 2018-05-15: qty 2

## 2018-05-15 MED ORDER — RABIES VACCINE, PCEC IM SUSR
1.0000 mL | Freq: Once | INTRAMUSCULAR | Status: AC
  Administered 2018-05-15: 1 mL via INTRAMUSCULAR
  Filled 2018-05-15: qty 1

## 2018-05-15 MED ORDER — SULFAMETHOXAZOLE-TRIMETHOPRIM 800-160 MG PO TABS: 1.0000 | ORAL_TABLET | Freq: Two times a day (BID) | ORAL | 0 refills | Status: AC

## 2018-05-15 MED ORDER — RABIES IMMUNE GLOBULIN 150 UNIT/ML IM INJ
20.0000 [IU]/kg | INJECTION | Freq: Once | INTRAMUSCULAR | Status: AC
  Administered 2018-05-15: 1350 [IU] via INTRAMUSCULAR
  Filled 2018-05-15: qty 10

## 2018-05-15 NOTE — ED Provider Notes (Signed)
[facility] EMERGENCY DEPARTMENT Provider Note   CSN: [id number] Arrival date & time: 05/15/18  1703    History   Chief Complaint Chief Complaint  Patient presents with  . Animal Bite    HPI Stephanie Ramsey is a 68 y.o. female.     HPI Patient sustained puncture wound to the right forearm and 2 puncture wounds to the left lateral thigh after being attacked by a neighbor's dog.  Dog is overdue for rabies vaccinations.  Incident occurred roughly 2-1/2 hours prior to presentation.  Patient states her last tetanus was 2 years ago.  Has been ambulatory since the incident.  Denies focal weakness or numbness.  Complains of bruising and pain at the site. Past Medical History:  Diagnosis Date  . Arthritis   . Cancer ([place])    right breast  . Carpal tunnel syndrome   . Chronic back pain   . Complication of anesthesia    "stopped breathing during procedure and dizziness after procedures"  . Hypercholesterolemia   . Hypothyroidism   . Pneumonia   . Ulcerative colitis [facility])     Patient Active Problem List   Diagnosis Date Noted  . Trigger ring finger of right hand 11/16/2017  . Hyperlipidemia 07/26/2015  . Hypothyroidism 07/26/2015  . Ulcerative colitis ([place]) 07/26/2015  . Primary osteoarthritis of left hip 01/03/2014  . 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 BILATERAL CARPAL TUNNEL RELEASE    . BREAST SURGERY     biopsy and lumpectomy right breast  . CHOLECYSTECTOMY    . COLONOSCOPY W/ BIOPSIES AND POLYPECTOMY    . JOINT REPLACEMENT     Total right hip  . 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   Procedure: LEFT TOTAL HIP ARTHROPLASTY;  Surgeon: Garald Balding, MD;  Location: [place];  Service: Orthopedics;  Laterality: Left;  . TRIGGER FINGER RELEASE       OB History   No obstetric history on file.      Home Medications    Prior to Admission medications   Medication Sig Start Date End Date Taking?  Authorizing Provider  atorvastatin (LIPITOR) 20 MG tablet Take 20 mg by mouth daily.    [provider]  Calcium Carb-Cholecalciferol (CALCIUM + D3) 600-200 MG-UNIT TABS Take 1 tablet by mouth daily.    [provider]  calcium-vitamin D (OSCAL WITH D) 500-200 MG-UNIT tablet Take 1 tablet by mouth.    [provider]  Cinnamon 500 MG capsule Take 500 mg by mouth daily.    [provider]  clindamycin (CLEOCIN) 150 MG capsule Take by mouth 3 (three) times daily.    [provider]  cyclobenzaprine (FLEXERIL) 5 MG tablet Take 5 mg by mouth 3 (three) times daily as needed for muscle spasms.    [provider]  doxycycline (VIBRAMYCIN) 100 MG capsule Take 1 capsule (100 mg total) by mouth 2 (two) times daily. One po bid x 7 days 05/15/18   Julianne Rice, MD  folic acid (FOLVITE) 381 MCG tablet Take 400 mcg by mouth daily.    [provider]  gabapentin (NEURONTIN) 300 MG capsule Take 1 capsule (300 mg total) by mouth 2 (two) times daily. 04/12/18   [name], DO  Ibuprofen-Famotidine (DUEXIS) 800-26.6 MG TABS Take by mouth.    [provider]  levothyroxine (SYNTHROID, LEVOTHROID) 150 MCG tablet Take 150 mcg by mouth daily before breakfast.    [provider]  Magnesium 500 MG CAPS Take by mouth.    [provider]  Misc Natural Products (TART CHERRY ADVANCED) CAPS Take by mouth.    [provider]  Multiple Vitamins-Minerals (MULTIVITAMIN WITH MINERALS) tablet Take 1 tablet by mouth daily.    [provider]  Omega-3 Fatty Acids (FISH OIL) 1360 MG CAPS Take by mouth.    [provider]  potassium gluconate (RA POTASSIUM GLUCONATE) 595 (99 K) MG TABS tablet Take 595 mg by mouth.    [provider]  sulfamethoxazole-trimethoprim (BACTRIM DS,SEPTRA DS) 800-160 MG tablet Take 1 tablet by mouth 2 (two) times daily for 7 days. 05/15/18 05/22/18  Julianne Rice, MD  sulfaSALAzine  (AZULFIDINE) 500 MG EC tablet Take 1,000 mg by mouth 2 (two) times daily.    [provider]  tiZANidine (ZANAFLEX) 2 MG tablet Take by mouth every 6 (six) hours as needed for muscle spasms.    [provider]  Ubiquinol 200 MG CAPS Take by mouth.    [provider]  valACYclovir (VALTREX) 500 MG tablet Take 500 mg by mouth 2 (two) times daily.    [provider]  vitamin B-12 (CYANOCOBALAMIN) 1000 MCG tablet Take 1,000 mcg by mouth daily.    [provider]    Family History Family History  Problem Relation Age of Onset  . Breast cancer Mother   . Arthritis Other     Social History Social History   Tobacco Use  . Smoking status: Former Smoker    Types: Cigarettes  . Smokeless tobacco: Never Used  . Tobacco comment: quit smoking cigarettes 32 years ago  Substance Use Topics  . Alcohol use: Yes    Comment: social  . Drug use: No     Allergies   Oxycodone and Penicillins   Review of Systems Review of Systems  Musculoskeletal: Positive for myalgias. Negative for arthralgias.  Skin: Positive for wound.  Neurological: Negative for weakness and numbness.  All other systems reviewed and are negative.    Physical Exam Updated Vital Signs BP (!) 168/88 (BP Location: Left Arm)   Pulse 96   Temp 98.6 F (37 C) (Oral)   Resp 20   Ht 5' 4"  (1.626 m)   Wt 65.8 kg   SpO2 98%   BMI 24.89 kg/m   Physical Exam Vitals signs and nursing note reviewed.  Constitutional:      Appearance: She is well-developed.  HENT:     Head: Normocephalic and atraumatic.  Eyes:     Pupils: Pupils are equal, round, and reactive to light.  Neck:     Musculoskeletal: Normal range of motion and neck supple.  Cardiovascular:     Rate and Rhythm: Normal rate.  Pulmonary:     Effort: Pulmonary effort is normal.  Abdominal:     Tenderness: There is no abdominal tenderness. There is no guarding or rebound.  Musculoskeletal: Normal range of motion.         General: Tenderness present.     Comments: Full range of motion of all joints.  Patient has 2 small puncture wounds of the lateral surface of the left thigh.  No active bleeding.  There is some surrounding contusion.  No foreign bodies visualized.  Patient has small puncture wound to the right forearm.  No active bleeding.   Skin:    General: Skin is warm and dry.     Findings: No erythema or rash.  Neurological:     General:  No focal deficit present.     Mental Status: She is alert and oriented to person, place, and time.     Comments: Sensation intact.  5/5 motor in all extremities.  Psychiatric:        Behavior: Behavior normal.      ED Treatments / Results  Labs (all labs ordered are listed, but only abnormal results are displayed) Labs Reviewed - No data to display  EKG None  Radiology No results found.  Procedures Procedures (including critical care time)  Medications Ordered in ED Medications  rabies vaccine (RABAVERT) injection 1 mL (has no administration in time range)  rabies immune globulin (HYPERAB/KEDRAB) injection 1,350 Units (has no administration in time range)  acetaminophen (TYLENOL) tablet 650 mg (has no administration in time range)     Initial Impression / Assessment and Plan / ED Course  I have reviewed the triage vital signs and the nursing notes.  Pertinent labs & imaging results that were available during my care of the patient were reviewed by me and considered in my medical decision making (see chart for details).        Patient with dog bite to the right forearm and left thigh.  Low suspicion for foreign bodies or fracture.  Do not believe that imaging is necessary at this time.  Will irrigate wound and initiate rabies vaccine and immunoglobulin.  Will also start on prophylactic antibiotics.  Final Clinical Impressions(s) / ED Diagnoses   Final diagnoses:  Dog bite, initial encounter    ED Discharge Orders         Ordered     doxycycline (VIBRAMYCIN) 100 MG capsule  2 times daily     05/15/18 1735    sulfamethoxazole-trimethoprim (BACTRIM DS,SEPTRA DS) 800-160 MG tablet  2 times daily     05/15/18 1735           Julianne Rice, MD 05/15/18 1736

## 2018-05-15 NOTE — Telephone Encounter (Signed)
Patient called and says she was walking in her and a dog from one of the homes broke loose and attacked her biting her right arm and left upper thigh in two places. She says one of the areas on thigh is still oozing blood, but not bleeding like it was intially when it happened 1 hour ago. She says the area is 1/2 inches long with a puncture, red around the area. She says the vet said the dog did not have all of it's shots. According to protocol, see, PCP within 4 hours, advised to go to UC, care advice given, patient verbalized understanding.  Reason for Disposition . [1] Puncture wound (hole through the skin) AND [2] from a cat bite (or deep claw puncture wound)  Answer Assessment - Initial Assessment Questions 1. ANIMAL: "What type of animal caused the bite?" "Is the injury from a bite or a claw?" If the animal is a dog or a cat, ask: "Was it a pet or a stray?" "Was it acting ill or behaving strangely?"     Yellow lab 2. LOCATION: "Where is the bite located?"      Upper left upper thigh and right arm 3. SIZE: "How big is the bite?" "What does it look like?"      1/2 inch long puncture, 2nd one on led is a small circle red all around it.  4. ONSET: "When did the bite happen?" (Minutes or hours ago)      1 hour ago 5. CIRCUMSTANCES: "Tell me how this happened."      Walking in the neighborhood 6. TETANUS: "When was the last tetanus booster?"      Unknown 7. PREGNANCY: "Is there any chance you are pregnant?" "When was your last menstrual period?"     No  Protocols used: ANIMAL BITE-A-AH

## 2018-05-15 NOTE — ED Notes (Signed)
                                  RABIES VACCINE FOLLOW UP  Patient's Name: Stephanie Ramsey                     Original Order Date:05/15/2018  Medical Record Number: [id number]  ED Physician: Julianne Rice, MD Primary Diagnosis: Rabies Exposure       PCP: [name], DO  Patient Phone Number: (home) [phone] (home)    (cell)  Telephone Information:  Mobile [phone]    (work) [phone] (work) Species of Animal:     You have been seen in the Emergency Department for a possible rabies exposure. It's very important you return for the additional vaccine doses.  Please call the clinic listed below for hours of operation.   Clinic that will administer your rabies vaccines:    DAY 0:  05/15/2018      DAY 3:  05/18/2018       DAY 7:  05/22/2018     DAY 14:  05/29/2018         The 5th vaccine injection is considered for immune compromised patients only.  DAY 28:  06/12/2018

## 2018-05-15 NOTE — ED Triage Notes (Signed)
Pt presents with 3 animal puncture wounds to arm and leg. Pt reports dog rabies are not UTD. Pt reports TDap is UTD. Pt ambulatory. No bleeding or swelling noted.

## 2018-05-15 NOTE — Discharge Instructions (Signed)
You will need rabies vaccine on 4/1, 4/5, and 4/12. These can be done at [facility] or this emergency department.

## 2018-05-16 NOTE — Telephone Encounter (Signed)
Called Pt, she was seen at [facility] yesterday. ABX was given , wound cleaned and must go through the rabies injection series over the next few months. Pt reported "that dog has not had his rabies shots , was due last may, this was 2nd time that dog has bitten a person. Now dog is being picked up and quarantined for 10 days, then a decision will be made after that for dog. Pt states she is doing Ok , but a little shaky, thanks for checking up on her " . End of call.

## 2018-05-17 ENCOUNTER — Telehealth: Payer: Self-pay | Admitting: Family Medicine

## 2018-05-17 NOTE — Telephone Encounter (Signed)
We can plan to collect labs at the visit in July, no need for additional lab visit prior to this.

## 2018-05-17 NOTE — Telephone Encounter (Signed)
I called patient to r/s physical that was schedule for 05/29/2018. We r/s it to 08/16/2018 at 8:30am. Patient just wanted to know if she would need any labs done before her physical for July. Also, she wanted Dr. Zigmund Daniel to know that she was recently bitten by a dog that is not up to date with their rabies shot. So she is in the process of getting her rabies vaccines at this moment.

## 2018-05-17 NOTE — Telephone Encounter (Signed)
RCB to Pt. Notified her no labs prior to July OV for CPE. Will complete labs at the time of OV. Call completed.

## 2018-05-18 ENCOUNTER — Other Ambulatory Visit: Payer: Self-pay

## 2018-05-18 ENCOUNTER — Emergency Department (INDEPENDENT_AMBULATORY_CARE_PROVIDER_SITE_OTHER)
Admission: EM | Admit: 2018-05-18 | Discharge: 2018-05-18 | Disposition: A | Discharge status: Home / Self Care | Payer: Medicare Other | Source: Home / Self Care

## 2018-05-18 DIAGNOSIS — Z2914 Encounter for prophylactic rabies immune globin: Secondary | ICD-10-CM

## 2018-05-18 DIAGNOSIS — Z203 Contact with and (suspected) exposure to rabies: Secondary | ICD-10-CM

## 2018-05-18 MED ORDER — RABIES VACCINE, PCEC IM SUSR
1.0000 mL | Freq: Once | INTRAMUSCULAR | Status: AC
  Administered 2018-05-18: 1 mL via INTRAMUSCULAR

## 2018-05-18 NOTE — ED Triage Notes (Signed)
Pt is here for 1st rabies injection.  Seen in ER 3/30.  Given in left arm today.

## 2018-05-22 ENCOUNTER — Other Ambulatory Visit: Payer: Self-pay

## 2018-05-22 ENCOUNTER — Emergency Department (INDEPENDENT_AMBULATORY_CARE_PROVIDER_SITE_OTHER)
Admission: EM | Admit: 2018-05-22 | Discharge: 2018-05-22 | Disposition: A | Discharge status: Home / Self Care | Payer: BC Managed Care – PPO | Source: Home / Self Care

## 2018-05-22 DIAGNOSIS — Z2914 Encounter for prophylactic rabies immune globin: Secondary | ICD-10-CM

## 2018-05-22 DIAGNOSIS — Z203 Contact with and (suspected) exposure to rabies: Secondary | ICD-10-CM | POA: Diagnosis not present

## 2018-05-22 MED ORDER — RABIES VACCINE, PCEC IM SUSR
1.0000 mL | Freq: Once | INTRAMUSCULAR | Status: AC
  Administered 2018-05-22: 1 mL via INTRAMUSCULAR

## 2018-05-22 NOTE — ED Triage Notes (Signed)
Pt is here for day 7 injection.  Given in right deltoid.

## 2018-05-26 ENCOUNTER — Encounter: Payer: BC Managed Care – PPO | Admitting: Family Medicine

## 2018-05-29 ENCOUNTER — Other Ambulatory Visit: Payer: Self-pay

## 2018-05-29 ENCOUNTER — Encounter: Payer: BC Managed Care – PPO | Admitting: Family Medicine

## 2018-05-29 ENCOUNTER — Emergency Department (INDEPENDENT_AMBULATORY_CARE_PROVIDER_SITE_OTHER)
Admission: EM | Admit: 2018-05-29 | Discharge: 2018-05-29 | Disposition: A | Discharge status: Home / Self Care | Payer: Medicare Other | Source: Home / Self Care

## 2018-05-29 DIAGNOSIS — Z2914 Encounter for prophylactic rabies immune globin: Secondary | ICD-10-CM | POA: Diagnosis not present

## 2018-05-29 DIAGNOSIS — Z203 Contact with and (suspected) exposure to rabies: Secondary | ICD-10-CM

## 2018-05-29 MED ORDER — RABIES VACCINE, PCEC IM SUSR
1.0000 mL | Freq: Once | INTRAMUSCULAR | Status: AC
  Administered 2018-05-29: 1 mL via INTRAMUSCULAR

## 2018-05-29 NOTE — ED Triage Notes (Signed)
Pt here for last rabies shot,given in left deltoid.

## 2018-08-16 ENCOUNTER — Encounter: Payer: Self-pay | Admitting: Family Medicine

## 2018-08-16 ENCOUNTER — Ambulatory Visit (INDEPENDENT_AMBULATORY_CARE_PROVIDER_SITE_OTHER): Payer: Medicare Other | Admitting: Family Medicine

## 2018-08-16 VITALS — BP 130/72 | HR 86 | Temp 98.6°F | Resp 18 | Ht 63.1 in | Wt 145.0 lb

## 2018-08-16 DIAGNOSIS — E039 Hypothyroidism, unspecified: Secondary | ICD-10-CM | POA: Diagnosis not present

## 2018-08-16 DIAGNOSIS — F419 Anxiety disorder, unspecified: Secondary | ICD-10-CM

## 2018-08-16 DIAGNOSIS — Z13220 Encounter for screening for lipoid disorders: Secondary | ICD-10-CM | POA: Diagnosis not present

## 2018-08-16 DIAGNOSIS — K5180 Other ulcerative colitis without complications: Secondary | ICD-10-CM

## 2018-08-16 DIAGNOSIS — Z0000 Encounter for general adult medical examination without abnormal findings: Secondary | ICD-10-CM | POA: Diagnosis not present

## 2018-08-16 DIAGNOSIS — M2550 Pain in unspecified joint: Secondary | ICD-10-CM

## 2018-08-16 NOTE — Patient Instructions (Signed)
For stress/anxiety you can try valerian root OR ashwaganda.  These can make you drowsy and I would recommend taking in the evenings. For inflammation: Continue tumeric, increase fish oil to 2025m/day.  Paraffin bath may help with hand pain as well.    Preventive Care 654Years and Older, Female Preventive care refers to lifestyle choices and visits with your health care provider that can promote health and wellness. This includes:  A yearly physical exam. This is also called an annual well check.  Regular dental and eye exams.  Immunizations.  Screening for certain conditions.  Healthy lifestyle choices, such as diet and exercise. What can I expect for my preventive care visit? Physical exam Your health care provider will check:  Height and weight. These may be used to calculate body mass index (BMI), which is a measurement that tells if you are at a healthy weight.  Heart rate and blood pressure.  Your skin for abnormal spots. Counseling Your health care provider may ask you questions about:  Alcohol, tobacco, and drug use.  Emotional well-being.  Home and relationship well-being.  Sexual activity.  Eating habits.  History of falls.  Memory and ability to understand (cognition).  Work and work eStatistician  Pregnancy and menstrual history. What immunizations do I need?  Influenza (flu) vaccine  This is recommended every year. Tetanus, diphtheria, and pertussis (Tdap) vaccine  You may need a Td booster every 10 years. Varicella (chickenpox) vaccine  You may need this vaccine if you have not already been vaccinated. Zoster (shingles) vaccine  You may need this after age 69 Pneumococcal conjugate (PCV13) vaccine  One dose is recommended after age 69 Pneumococcal polysaccharide (PPSV23) vaccine  One dose is recommended after age 69 Measles, mumps, and rubella (MMR) vaccine  You may need at least one dose of MMR if you were born in 1957 or later. You  may also need a second dose. Meningococcal conjugate (MenACWY) vaccine  You may need this if you have certain conditions. Hepatitis A vaccine  You may need this if you have certain conditions or if you travel or work in places where you may be exposed to hepatitis A. Hepatitis B vaccine  You may need this if you have certain conditions or if you travel or work in places where you may be exposed to hepatitis B. Haemophilus influenzae type b (Hib) vaccine  You may need this if you have certain conditions. You may receive vaccines as individual doses or as more than one vaccine together in one shot (combination vaccines). Talk with your health care provider about the risks and benefits of combination vaccines. What tests do I need? Blood tests  Lipid and cholesterol levels. These may be checked every 5 years, or more frequently depending on your overall health.  Hepatitis C test.  Hepatitis B test. Screening  Lung cancer screening. You may have this screening every year starting at age 251if you have a 30-pack-year history of smoking and currently smoke or have quit within the past 15 years.  Colorectal cancer screening. All adults should have this screening starting at age 2561and continuing until age 69 Your health care provider may recommend screening at age 6822if you are at increased risk. You will have tests every 1-10 years, depending on your results and the type of screening test.  Diabetes screening. This is done by checking your blood sugar (glucose) after you have not eaten for a while (fasting). You may have this done every 1-3  years.  Mammogram. This may be done every 1-2 years. Talk with your health care provider about how often you should have regular mammograms.  BRCA-related cancer screening. This may be done if you have a family history of breast, ovarian, tubal, or peritoneal cancers. Other tests  Sexually transmitted disease (STD) testing.  Bone density scan. This  is done to screen for osteoporosis. You may have this done starting at age 97. Follow these instructions at home: Eating and drinking  Eat a diet that includes fresh fruits and vegetables, whole grains, lean protein, and low-fat dairy products. Limit your intake of foods with high amounts of sugar, saturated fats, and salt.  Take vitamin and mineral supplements as recommended by your health care provider.  Do not drink alcohol if your health care provider tells you not to drink.  If you drink alcohol: ? Limit how much you have to 0-1 drink a day. ? Be aware of how much alcohol is in your drink. In the [place] one drink equals one 12 oz bottle of beer (355 mL), one 5 oz glass of wine (148 mL), or one 1 oz glass of hard liquor (44 mL). Lifestyle  Take daily care of your teeth and gums.  Stay active. Exercise for at least 30 minutes on 5 or more days each week.  Do not use any products that contain nicotine or tobacco, such as cigarettes, e-cigarettes, and chewing tobacco. If you need help quitting, ask your health care provider.  If you are sexually active, practice safe sex. Use a condom or other form of protection in order to prevent STIs (sexually transmitted infections).  Talk with your health care provider about taking a low-dose aspirin or statin. What's next?  Go to your health care provider once a year for a well check visit.  Ask your health care provider how often you should have your eyes and teeth checked.  Stay up to date on all vaccines. This information is not intended to replace advice given to you by your health care provider. Make sure you discuss any questions you have with your health care provider. Document Released: 02/28/2015 Document Revised: 01/26/2018 Document Reviewed: 01/26/2018 Elsevier Patient Education  2020 [employer].

## 2018-08-17 ENCOUNTER — Encounter: Payer: Self-pay | Admitting: Family Medicine

## 2018-08-17 DIAGNOSIS — F419 Anxiety disorder, unspecified: Secondary | ICD-10-CM | POA: Insufficient documentation

## 2018-08-17 LAB — CBC WITH DIFFERENTIAL/PLATELET
Absolute Monocytes: 292 cells/uL (ref 200–950)
Basophils Absolute: 39 cells/uL (ref 0–200)
Basophils Relative: 0.9 %
Eosinophils Absolute: 9 cells/uL — ABNORMAL LOW (ref 15–500)
Eosinophils Relative: 0.2 %
HCT: 39.1 % (ref 35.0–45.0)
Hemoglobin: 13.2 g/dL (ref 11.7–15.5)
Lymphs Abs: 1522 cells/uL (ref 850–3900)
MCH: 33.2 pg — ABNORMAL HIGH (ref 27.0–33.0)
MCHC: 33.8 g/dL (ref 32.0–36.0)
MCV: 98.5 fL (ref 80.0–100.0)
MPV: 10.1 fL (ref 7.5–12.5)
Monocytes Relative: 6.8 %
Neutro Abs: 2438 cells/uL (ref 1500–7800)
Neutrophils Relative %: 56.7 %
Platelets: 252 10*3/uL (ref 140–400)
RBC: 3.97 10*6/uL (ref 3.80–5.10)
RDW: 11.9 % (ref 11.0–15.0)
Total Lymphocyte: 35.4 %
WBC: 4.3 10*3/uL (ref 3.8–10.8)

## 2018-08-17 LAB — COMPREHENSIVE METABOLIC PANEL
AG Ratio: 2 (calc) (ref 1.0–2.5)
ALT: 18 U/L (ref 6–29)
AST: 25 U/L (ref 10–35)
Albumin: 4.4 g/dL (ref 3.6–5.1)
Alkaline phosphatase (APISO): 56 U/L (ref 37–153)
BUN: 15 mg/dL (ref 7–25)
CO2: 29 mmol/L (ref 20–32)
Calcium: 9.9 mg/dL (ref 8.6–10.4)
Chloride: 105 mmol/L (ref 98–110)
Creat: 0.88 mg/dL (ref 0.50–0.99)
Globulin: 2.2 g/dL (calc) (ref 1.9–3.7)
Glucose, Bld: 99 mg/dL (ref 65–99)
Potassium: 4.2 mmol/L (ref 3.5–5.3)
Sodium: 140 mmol/L (ref 135–146)
Total Bilirubin: 0.5 mg/dL (ref 0.2–1.2)
Total Protein: 6.6 g/dL (ref 6.1–8.1)

## 2018-08-17 LAB — LIPID PANEL
Cholesterol: 203 mg/dL — ABNORMAL HIGH (ref <200)
HDL: 80 mg/dL (ref >=50)
LDL Cholesterol (Calc): 107 mg/dL (calc) — ABNORMAL HIGH
Non-HDL Cholesterol (Calc): 123 mg/dL (calc) (ref <130)
Total CHOL/HDL Ratio: 2.5 (calc) (ref <5.0)
Triglycerides: 70 mg/dL (ref <150)

## 2018-08-17 LAB — VITAMIN B12: Vitamin B-12: 1418 pg/mL — ABNORMAL HIGH (ref 200–1100)

## 2018-08-17 LAB — VITAMIN D 25 HYDROXY (VIT D DEFICIENCY, FRACTURES): Vit D, 25-Hydroxy: 49 ng/mL (ref 30–100)

## 2018-08-17 LAB — TSH: TSH: 0.83 mIU/L (ref 0.40–4.50)

## 2018-08-17 NOTE — Assessment & Plan Note (Signed)
Looking for natural/homeopathic treatment to her anxiety. Discussed meditation,yoga, etc.  May try valerian or ashwaganda in the evenings.

## 2018-08-17 NOTE — Assessment & Plan Note (Signed)
Well adult Labs per orders:  Orders Placed This Encounter  Procedures  . B12  . CBC w/Diff  . Comp Met (CMET)  . Lipid panel  . TSH  . Vitamin D (25 hydroxy)  Screenings: Up to date, mammogram completed and DEXA done with ortho [place] x2 years ago.  Immunizations: UTD Anticipatory guidance/RIsk factor reduction:  Counseled on balanced, healthy diet.  Encouraged regular exercise and continuation of current healthy habits.  Additional recommendations per AVS

## 2018-08-17 NOTE — Progress Notes (Signed)
Stephanie Ramsey - 68 y.o. female MRN [id number]  Date of birth: 10-25-1950  Subjective Chief Complaint  Patient presents with  . Annual Exam    CPE, mood swings/hot- cold flashes/arthritis of hands.     HPI [name] is a 68 y.o. female with history of UC, hld, and hypothyroidism.  She is here today for annual exam.  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  She remains fairly active and spends a lot of time outdoors.  She tries to follow a fairly healthy diet.  She quit smoking >30 years ago.    She is followed by orthopedics for arhtritis.  Has voltaren gel but not using regularly, would like to discuss alternative natural remedies.  She is taking tumeric and fish oil currently.    In regards to stress and anxiety she reports some mild discord between her husband and daughter that causes her anxiety.  Has some difficulty sleeping due to ruminating thoughts about this.   Depression screen [facility] 2/9 08/16/2018 03/31/2018  Decreased Interest 0 0  Down, Depressed, Hopeless 0 0  PHQ - 2 Score 0 0   GAD 7 : Generalized Anxiety Score 08/16/2018  Nervous, Anxious, on Edge 0  Control/stop worrying 0  Worry too much - different things 1  Trouble relaxing 1  Restless 0  Easily annoyed or irritable 0  Afraid - awful might happen 1  Total GAD 7 Score 3  Anxiety Difficulty Somewhat difficult    Review of Systems  Constitutional: Negative for chills, fever, malaise/fatigue and weight loss.  HENT: Negative for congestion, ear pain and sore throat.   Eyes: Negative for blurred vision, double vision and pain.  Respiratory: Negative for cough and shortness of breath.   Cardiovascular: Negative for chest pain and palpitations.  Gastrointestinal: Negative for abdominal pain, blood in stool, constipation, heartburn and nausea.  Genitourinary: Negative for dysuria and urgency.  Musculoskeletal: Positive for joint pain. Negative for myalgias.  Neurological: Negative for dizziness and  headaches.  Endo/Heme/Allergies: Does not bruise/bleed easily.  Psychiatric/Behavioral: Negative for depression. The patient is nervous/anxious. The patient does not have insomnia.     Allergies  Allergen Reactions  . Oxycodone Swelling and Other (See Comments)    DROPS BP  . Penicillins Rash    Past Medical History:  Diagnosis Date  . Arthritis   . Cancer ([place])    right breast  . Carpal tunnel syndrome   . Chronic back pain   . Complication of anesthesia    "stopped breathing during procedure and dizziness after procedures"  . Hypercholesterolemia   . Hypothyroidism   . Pneumonia   . Ulcerative colitis [facility])     Past Surgical History:  Procedure Laterality Date  . BILATERAL CARPAL TUNNEL RELEASE    . BREAST SURGERY     biopsy and lumpectomy right breast  . CHOLECYSTECTOMY    . COLONOSCOPY W/ BIOPSIES AND POLYPECTOMY    . JOINT REPLACEMENT     Total right hip  . 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   Procedure: LEFT TOTAL HIP ARTHROPLASTY;  Surgeon: Garald Balding, MD;  Location: [facility];  Service: Orthopedics;  Laterality: Left;  . TRIGGER FINGER RELEASE      Social History   Socioeconomic History  . Marital status: Married    Spouse name: Not on file  . Number of children: Not on file  . Years of education: Not on file  . Highest education level: Not on  file  Occupational History  . Not on file  Social Needs  . Financial resource strain: Not very hard  . Food insecurity    Worry: Never true    Inability: Never true  . Transportation needs    Medical: No    Non-medical: No  Tobacco Use  . Smoking status: Former Smoker    Types: Cigarettes  . Smokeless tobacco: Never Used  . Tobacco comment: quit smoking cigarettes 32 years ago  Substance and Sexual Activity  . Alcohol use: Yes    Comment: social  . Drug use: No  . Sexual activity: Yes    Birth control/protection: None  Lifestyle  . Physical activity    Days per week: 3 days    Minutes per  session: Not on file  . Stress: To some extent  Relationships  . Social Herbalist on phone: Three times a week    Gets together: Three times a week    Attends religious service: Not on file    Active member of club or organization: Not on file    Attends meetings of clubs or organizations: Not on file    Relationship status: Married  Other Topics Concern  . Not on file  Social History Narrative  . Not on file    Family History  Problem Relation Age of Onset  . Breast cancer Mother   . Arthritis Other     Health Maintenance  Topic Date Due  . Hepatitis C Screening  05/22/1950  . MAMMOGRAM  12/12/1999  . DEXA SCAN  12/12/2014  . INFLUENZA VACCINE  09/16/2018  . PNA vac Low Risk Adult (2 of 2 - PPSV23) 01/03/2019  . TETANUS/TDAP  05/04/2026  . COLONOSCOPY  01/16/2028    ----------------------------------------------------------------------------------------------------------------------------------------------------------------------------------------------------------------- Physical Exam BP 130/72   Pulse 86   Temp 98.6 F (37 C) (Oral)   Resp 18   Ht 5' 3.1" (1.603 m)   Wt 145 lb (65.8 kg)   SpO2 98%   BMI 25.60 kg/m   Physical Exam Constitutional:      General: She is not in acute distress. HENT:     Head: Normocephalic and atraumatic.     Nose: Nose normal.  Eyes:     General: No scleral icterus.    Conjunctiva/sclera: Conjunctivae normal.  Neck:     Musculoskeletal: Normal range of motion and neck supple.     Thyroid: No thyromegaly.  Cardiovascular:     Rate and Rhythm: Normal rate and regular rhythm.     Heart sounds: Normal heart sounds.  Pulmonary:     Effort: Pulmonary effort is normal.     Breath sounds: Normal breath sounds.  Abdominal:     General: Bowel sounds are normal. There is no distension.     Palpations: Abdomen is soft.     Tenderness: There is no abdominal tenderness. There is no guarding.  Musculoskeletal: Normal  range of motion.  Lymphadenopathy:     Cervical: No cervical adenopathy.  Skin:    General: Skin is warm and dry.     Findings: No rash.  Neurological:     Mental Status: She is alert and oriented to person, place, and time.     Cranial Nerves: No cranial nerve deficit.     Coordination: Coordination normal.  Psychiatric:        Behavior: Behavior normal.     ------------------------------------------------------------------------------------------------------------------------------------------------------------------------------------------------------------------- Assessment and Plan  Annual physical exam Well adult Labs per orders:  Orders Placed  This Encounter  Procedures  . B12  . CBC w/Diff  . Comp Met (CMET)  . Lipid panel  . TSH  . Vitamin D (25 hydroxy)  Screenings: Up to date, mammogram completed and DEXA done with ortho [place] x2 years ago.  Immunizations: UTD Anticipatory guidance/RIsk factor reduction:  Counseled on balanced, healthy diet.  Encouraged regular exercise and continuation of current healthy habits.  Additional recommendations per AVS   Anxiety Looking for natural/homeopathic treatment to her anxiety. Discussed meditation,yoga, etc.  May try valerian or ashwaganda in the evenings.    Ulcerative colitis ([place]) -Stable, followed by GI.  >20 years since last flare.   Hypothyroidism -Update TSH  Pain in joints -She will continue tumeric and increase fish oil 2030m omega 3 daily.   -Discussed that paraffin wax bath may help with hand pain.

## 2018-08-17 NOTE — Assessment & Plan Note (Signed)
-  Stable, followed by GI.  >20 years since last flare.

## 2018-08-17 NOTE — Assessment & Plan Note (Signed)
Update TSH

## 2018-08-17 NOTE — Assessment & Plan Note (Signed)
-  She will continue tumeric and increase fish oil 2068m omega 3 daily.   -Discussed that paraffin wax bath may help with hand pain.

## 2018-08-21 ENCOUNTER — Other Ambulatory Visit: Payer: Self-pay | Admitting: Family Medicine

## 2018-10-02 ENCOUNTER — Other Ambulatory Visit: Payer: Self-pay | Admitting: Family Medicine

## 2018-10-24 ENCOUNTER — Encounter: Payer: Self-pay | Admitting: Family Medicine

## 2018-10-25 ENCOUNTER — Other Ambulatory Visit: Payer: Self-pay | Admitting: Family Medicine

## 2018-10-25 DIAGNOSIS — Z1239 Encounter for other screening for malignant neoplasm of breast: Secondary | ICD-10-CM

## 2018-12-13 ENCOUNTER — Ambulatory Visit: Payer: Medicare Other

## 2018-12-18 ENCOUNTER — Ambulatory Visit
Admission: RE | Admit: 2018-12-18 | Discharge: 2018-12-18 | Disposition: A | Discharge status: Home / Self Care | Payer: BC Managed Care – PPO | Source: Ambulatory Visit | Attending: Family Medicine | Admitting: Family Medicine

## 2018-12-18 ENCOUNTER — Other Ambulatory Visit: Payer: Self-pay

## 2018-12-18 DIAGNOSIS — Z1239 Encounter for other screening for malignant neoplasm of breast: Secondary | ICD-10-CM

## 2019-02-19 ENCOUNTER — Encounter: Payer: Self-pay | Admitting: Family Medicine

## 2019-02-19 ENCOUNTER — Other Ambulatory Visit: Payer: Self-pay | Admitting: Nurse Practitioner

## 2019-02-19 DIAGNOSIS — M1612 Unilateral primary osteoarthritis, left hip: Secondary | ICD-10-CM

## 2019-02-19 DIAGNOSIS — U071 COVID-19: Secondary | ICD-10-CM

## 2019-02-19 NOTE — Progress Notes (Signed)
  I connected by phone with Stephanie Ramsey on 02/19/2019 at 3:09 PM to discuss the potential use of an new treatment for mild to moderate COVID-19 viral infection in non-hospitalized patients.  This patient is a 69 y.o. female that meets the FDA criteria for Emergency Use Authorization of bamlanivimab or casirivimab\imdevimab.  Has a (+) direct SARS-CoV-2 viral test result  Has mild or moderate COVID-19   Is ? 70 years of age and weighs ? 40 kg  Is NOT hospitalized due to COVID-19  Is NOT requiring oxygen therapy or requiring an increase in baseline oxygen flow rate due to COVID-19  Is within 10 days of symptom onset  Has at least one of the high risk factor(s) for progression to severe COVID-19 and/or hospitalization as defined in EUA.  Specific high risk criteria : >/= 70 yo   I have spoken and communicated the following to the patient or parent/caregiver:  1. FDA has authorized the emergency use of bamlanivimab and casirivimab\imdevimab for the treatment of mild to moderate COVID-19 in adults and pediatric patients with positive results of direct SARS-CoV-2 viral testing who are 74 years of age and older weighing at least 40 kg, and who are at high risk for progressing to severe COVID-19 and/or hospitalization.  2. The significant known and potential risks and benefits of bamlanivimab and casirivimab\imdevimab, and the extent to which such potential risks and benefits are unknown.  3. Information on available alternative treatments and the risks and benefits of those alternatives, including clinical trials.  4. Patients treated with bamlanivimab and casirivimab\imdevimab should continue to self-isolate and use infection control measures (e.g., wear mask, isolate, social distance, avoid sharing personal items, clean and disinfect "high touch" surfaces, and frequent handwashing) according to CDC guidelines.   5. The patient or parent/caregiver has the option to accept or refuse  bamlanivimab or casirivimab\imdevimab .  After reviewing this information with the patient, The patient agreed to proceed with receiving the bamlanimivab infusion and will be provided a copy of the Fact sheet prior to receiving the infusion.Fenton Foy 02/19/2019 3:09 PM

## 2019-02-20 NOTE — Progress Notes (Signed)
Virtual Visit via Video Note  I connected with patient on 02/21/19 at  9:30 AM EST by audio enabled telemedicine application and verified that I am speaking with the correct person using two identifiers.   THIS ENCOUNTER IS A VIRTUAL VISIT DUE TO COVID-19 - PATIENT WAS NOT SEEN IN THE OFFICE. PATIENT HAS CONSENTED TO VIRTUAL VISIT / TELEMEDICINE VISIT   Location of patient: home  Location of provider: office  I discussed the limitations of evaluation and management by telemedicine and the availability of in person appointments. The patient expressed understanding and agreed to proceed.   Subjective:   Stephanie Ramsey is a 69 y.o. female who presents for an Initial Medicare Annual Wellness Visit.  The Patient was informed that the wellness visit is to identify future health risk and educate and initiate measures that can reduce risk for increased disease through the lifespan.   Describes health as fair, good or great? Good.   Review of Systems   Home Safety/Smoke Alarms: Feels safe in home. Smoke alarms in place.  Lives w/ husband in split level home. No issues w/ stairs.  Female:   Mammo- 12/19/18      Dexa scan-  Pt states too much going on currently     [facility]-pt reports done 01/2018. Recall 2 yrs.    Objective:    Today's Vitals   02/21/19 0922  BP: 125/75  Weight: 133 lb 6.4 oz (60.5 kg)   Body mass index is 23.56 kg/m.  Advanced Directives 02/21/2019 05/15/2018 12/21/2013  Does Patient Have a Medical Advance Directive? Yes No Yes  Type of Paramedic of [place];Living will - [place];Living will  Does patient want to make changes to medical advance directive? No - Patient declined - No - Patient declined  Copy of [place] in Chart? Yes - validated most recent copy scanned in chart (See row information) - No - copy requested  Would patient like information on creating a medical advance directive? - No - Patient  declined -    Current Medications (verified) Outpatient Encounter Medications as of 02/21/2019  Medication Sig  . atorvastatin (LIPITOR) 20 MG tablet TAKE 1 TABLET(20 MG) BY MOUTH DAILY  . Calcium Carb-Cholecalciferol (CALCIUM + D3) 600-200 MG-UNIT TABS Take 1 tablet by mouth daily.  . calcium-vitamin D (OSCAL WITH D) 500-200 MG-UNIT tablet Take 1 tablet by mouth.  . Cinnamon 500 MG capsule Take 500 mg by mouth daily.  . cyclobenzaprine (FLEXERIL) 5 MG tablet Take 5 mg by mouth 3 (three) times daily as needed for muscle spasms.  . diclofenac sodium (VOLTAREN) 1 % GEL APP 2 TO 4 GRAMS AA BID PRN P.  . folic acid (FOLVITE) 354 MCG tablet Take 400 mcg by mouth daily.  Marland Kitchen gabapentin (NEURONTIN) 300 MG capsule Take 1 capsule (300 mg total) by mouth 2 (two) times daily.  . Ibuprofen-Famotidine (DUEXIS) 800-26.6 MG TABS Take by mouth.  . levothyroxine (SYNTHROID) 150 MCG tablet TAKE 1 TABLET BY MOUTH EVERY DAY  . Magnesium 500 MG CAPS Take by mouth.  . Multiple Vitamins-Minerals (MULTIVITAMIN WITH MINERALS) tablet Take 1 tablet by mouth daily.  . Omega-3 Fatty Acids (FISH OIL) 1360 MG CAPS Take by mouth.  . potassium gluconate (RA POTASSIUM GLUCONATE) 595 (99 K) MG TABS tablet Take 595 mg by mouth.  . sulfaSALAzine (AZULFIDINE) 500 MG EC tablet Take 1,000 mg by mouth 2 (two) times daily.  Marland Kitchen tiZANidine (ZANAFLEX) 2 MG tablet Take by  mouth every 6 (six) hours as needed for muscle spasms.  . Ubiquinol 200 MG CAPS Take by mouth.  . valACYclovir (VALTREX) 500 MG tablet Take 500 mg by mouth 2 (two) times daily.  . vitamin B-12 (CYANOCOBALAMIN) 1000 MCG tablet Take 1,000 mcg by mouth daily.   No facility-administered encounter medications on file as of 02/21/2019.    Allergies (verified) Oxycodone and Penicillins   History: Past Medical History:  Diagnosis Date  . Arthritis   . Cancer ([place])    right breast  . Carpal tunnel syndrome   . Chronic back pain   . Complication of anesthesia     "stopped breathing during procedure and dizziness after procedures"  . Hypercholesterolemia   . Hypothyroidism   . Pneumonia   . Ulcerative colitis [facility])    Past Surgical History:  Procedure Laterality Date  . BILATERAL CARPAL TUNNEL RELEASE    . 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 BREAST LUMPECTOMY Right 2011   in situ   . BREAST SURGERY     biopsy and lumpectomy right breast  . CHOLECYSTECTOMY    . COLONOSCOPY W/ BIOPSIES AND POLYPECTOMY    . JOINT REPLACEMENT     Total right hip  . 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   Procedure: LEFT TOTAL HIP ARTHROPLASTY;  Surgeon: Garald Balding, MD;  Location: [facility];  Service: Orthopedics;  Laterality: Left;  . TRIGGER FINGER RELEASE     Family History  Problem Relation Age of Onset  . Breast cancer Mother   . Arthritis Other    Social History   Socioeconomic History  . Marital status: Married    Spouse name: Not on file  . Number of children: Not on file  . Years of education: Not on file  . Highest education level: Not on file  Occupational History  . Not on file  Tobacco Use  . Smoking status: Former Smoker    Types: Cigarettes  . Smokeless tobacco: Never Used  . Tobacco comment: quit smoking cigarettes 32 years ago  Substance and Sexual Activity  . Alcohol use: Yes    Comment: social  . Drug use: No  . Sexual activity: Yes    Birth control/protection: None  Other Topics Concern  . Not on file  Social History Narrative  . Not on file   Social Determinants of Health   Financial Resource Strain: Low Risk   . Difficulty of Paying Living Expenses: Not very hard  Food Insecurity: No Food Insecurity  . Worried About Charity fundraiser in the Last Year: Never true  . Ran Out of Food in the Last Year: Never true  Transportation Needs: No Transportation Needs  . Lack of Transportation (Medical): No  . Lack of Transportation (Non-Medical): No  Physical Activity: Unknown  . Days of Exercise per Week: 3  days  . Minutes of Exercise per Session: Not on file  Stress: Stress Concern Present  . Feeling of Stress : To some extent  Social Connections: Unknown  . Frequency of Communication with Friends and Family: Three times a week  . Frequency of Social Gatherings with Friends and Family: Three times a week  . Attends Religious Services: Not on file  . Active Member of Clubs or Organizations: Not on file  . Attends Archivist Meetings: Not on file  . Marital Status: Married    Tobacco Counseling Counseling given: Not Answered Comment: quit smoking cigarettes 32 years ago  Clinical Intake: Pain : No/denies pain    Activities of Daily Living In your present state of health, do you have any difficulty performing the following activities: 02/21/2019  Hearing? N  Vision? N  Difficulty concentrating or making decisions? N  Walking or climbing stairs? N  Dressing or bathing? N  Doing errands, shopping? N  Preparing Food and eating ? N  Using the Toilet? N  In the past six months, have you accidently leaked urine? N  Do you have problems with loss of bowel control? N  Managing your Medications? N  Managing your Finances? N  Housekeeping or managing your Housekeeping? N  Some recent data might be hidden     Immunizations and Health Maintenance Immunization History  Administered Date(s) Administered  . Influenza, High Dose Seasonal PF 01/04/2016, 01/02/2018  . Influenza-Unspecified 12/14/2004, 11/22/2011, 12/11/2012, 11/27/2013, 12/03/2016  . Pneumococcal Conjugate-13 08/13/2016  . Pneumococcal Polysaccharide-23 01/02/2014  . Rabies, IM 05/15/2018, 05/18/2018, 05/22/2018, 05/29/2018  . Tdap 12/16/2006, 05/03/2016  . Zoster 09/28/2010, 12/21/2016   Health Maintenance Due  Topic Date Due  . Hepatitis C Screening  26-Aug-1950  . DEXA SCAN  12/12/2014  . INFLUENZA VACCINE  09/16/2018  . PNA vac Low Risk Adult (2 of 2 - PPSV23) 01/03/2019    Patient Care  Team: [name], DO as PCP - General (Family Medicine) Hinda Lenis, MD as Referring Physician (Orthopedic Surgery) Richmond Campbell, MD as Consulting Physician (Gastroenterology)  Indicate any recent Medical Services you may have received from other than [facility] providers in the past year (date may be approximate).     Assessment:   This is a routine wellness examination for [place]. Physical assessment deferred to PCP.  Hearing/Vision screen Unable to assess. This visit is enabled though telemedicine due to Covid 19.   Dietary issues and exercise activities discussed: Current Exercise Habits: Home exercise routine, Time (Minutes): 30, Frequency (Times/Week): 3, Weekly Exercise (Minutes/Week): 90, Exercise limited by: None identified Diet (meal preparation, eat out, water intake, caffeinated beverages, dairy products, fruits and vegetables): in general, a "healthy" diet  , well balanced   Goals    . Maintain healthy active lifestyle.      Depression Screen PHQ 2/9 Scores 02/21/2019 08/16/2018 03/31/2018  PHQ - 2 Score 0 0 0    Fall Risk Fall Risk  02/21/2019 08/16/2018  Falls in the past year? 0 0  Injury with Fall? - 0  Follow up Education provided;Falls prevention discussed Falls evaluation completed   Cognitive Function: Ad8 score reviewed for issues:  Issues making decisions:no  Less interest in hobbies / activities:no  Repeats questions, stories (family complaining):no  Trouble using ordinary gadgets (microwave, computer, phone):no  Forgets the month or year: no  Mismanaging finances: no  Remembering appts:no  Daily problems with thinking and/or memory:no Ad8 score is=0         Screening Tests Health Maintenance  Topic Date Due  . Hepatitis C Screening  1951-01-16  . DEXA SCAN  12/12/2014  . INFLUENZA VACCINE  09/16/2018  . PNA vac Low Risk Adult (2 of 2 - PPSV23) 01/03/2019  . MAMMOGRAM  12/17/2020  . TETANUS/TDAP  05/04/2026  . COLONOSCOPY   01/16/2028      Plan:    Please schedule your next medicare wellness visit with me in 1 yr.  Continue to eat heart healthy diet (full of fruits, vegetables, whole grains, lean protein, water--limit salt, fat, and sugar intake) and increase physical activity as tolerated.  Continue doing brain stimulating  activities (puzzles, reading, adult coloring books, staying active) to keep memory sharp.     I have personally reviewed and noted the following in the patient's chart:   . Medical and social history . Use of alcohol, tobacco or illicit drugs  . Current medications and supplements . Functional ability and status . Nutritional status . Physical activity . Advanced directives . List of other physicians . Hospitalizations, surgeries, and ER visits in previous 12 months . Vitals . Screenings to include cognitive, depression, and falls . Referrals and appointments  In addition, I have reviewed and discussed with patient certain preventive protocols, quality metrics, and best practice recommendations. A written personalized care plan for preventive services as well as general preventive health recommendations were provided to patient.     Shela Nevin, [place]   02/21/2019

## 2019-02-21 ENCOUNTER — Encounter: Payer: Self-pay | Admitting: Family Medicine

## 2019-02-21 ENCOUNTER — Ambulatory Visit (INDEPENDENT_AMBULATORY_CARE_PROVIDER_SITE_OTHER): Payer: Medicare Other | Admitting: *Deleted

## 2019-02-21 ENCOUNTER — Encounter: Payer: Self-pay | Admitting: *Deleted

## 2019-02-21 VITALS — BP 125/75 | Wt 133.4 lb

## 2019-02-21 DIAGNOSIS — Z0000 Encounter for general adult medical examination without abnormal findings: Secondary | ICD-10-CM | POA: Diagnosis not present

## 2019-02-21 NOTE — Patient Instructions (Signed)
Please schedule your next medicare wellness visit with me in 1 yr.  Continue to eat heart healthy diet (full of fruits, vegetables, whole grains, lean protein, water--limit salt, fat, and sugar intake) and increase physical activity as tolerated.  Continue doing brain stimulating activities (puzzles, reading, adult coloring books, staying active) to keep memory sharp.     Stephanie Ramsey , Thank you for taking time to come for your Medicare Wellness Visit. I appreciate your ongoing commitment to your health goals. Please review the following plan we discussed and let me know if I can assist you in the future.   These are the goals we discussed: Goals    . Maintain healthy active lifestyle.       This is a list of the screening recommended for you and due dates:  Health Maintenance  Topic Date Due  .  Hepatitis C: One time screening is recommended by Center for Disease Control  (CDC) for  adults born from [birth year] through 1966.   09-Dec-1950  . DEXA scan (bone density measurement)  12/12/2014  . Flu Shot  09/16/2018  . Pneumonia vaccines (2 of 2 - PPSV23) 01/03/2019  . Mammogram  12/17/2020  . Tetanus Vaccine  05/04/2026  . Colon Cancer Screening  01/16/2028    Preventive Care 65 Years and Older, Female Preventive care refers to lifestyle choices and visits with your health care provider that can promote health and wellness. This includes:  A yearly physical exam. This is also called an annual well check.  Regular dental and eye exams.  Immunizations.  Screening for certain conditions.  Healthy lifestyle choices, such as diet and exercise. What can I expect for my preventive care visit? Physical exam Your health care provider will check:  Height and weight. These may be used to calculate body mass index (BMI), which is a measurement that tells if you are at a healthy weight.  Heart rate and blood pressure.  Your skin for abnormal spots. Counseling Your health care provider may ask  you questions about:  Alcohol, tobacco, and drug use.  Emotional well-being.  Home and relationship well-being.  Sexual activity.  Eating habits.  History of falls.  Memory and ability to understand (cognition).  Work and work Statistician.  Pregnancy and menstrual history. What immunizations do I need?  Influenza (flu) vaccine  This is recommended every year. Tetanus, diphtheria, and pertussis (Tdap) vaccine  You may need a Td booster every 10 years. Varicella (chickenpox) vaccine  You may need this vaccine if you have not already been vaccinated. Zoster (shingles) vaccine  You may need this after age 77. Pneumococcal conjugate (PCV13) vaccine  One dose is recommended after age 39. Pneumococcal polysaccharide (PPSV23) vaccine  One dose is recommended after age 27. Measles, mumps, and rubella (MMR) vaccine  You may need at least one dose of MMR if you were born in 1957 or later. You may also need a second dose. Meningococcal conjugate (MenACWY) vaccine  You may need this if you have certain conditions. Hepatitis A vaccine  You may need this if you have certain conditions or if you travel or work in places where you may be exposed to hepatitis A. Hepatitis B vaccine  You may need this if you have certain conditions or if you travel or work in places where you may be exposed to hepatitis B. Haemophilus influenzae type b (Hib) vaccine  You may need this if you have certain conditions. You may receive vaccines as individual doses or  as more than one vaccine together in one shot (combination vaccines). Talk with your health care provider about the risks and benefits of combination vaccines. What tests do I need? Blood tests  Lipid and cholesterol levels. These may be checked every 5 years, or more frequently depending on your overall health.  Hepatitis C test.  Hepatitis B test. Screening  Lung cancer screening. You may have this screening every year  starting at age 64 if you have a 30-pack-year history of smoking and currently smoke or have quit within the past 15 years.  Colorectal cancer screening. All adults should have this screening starting at age 52 and continuing until age 31. Your health care provider may recommend screening at age 87 if you are at increased risk. You will have tests every 1-10 years, depending on your results and the type of screening test.  Diabetes screening. This is done by checking your blood sugar (glucose) after you have not eaten for a while (fasting). You may have this done every 1-3 years.  Mammogram. This may be done every 1-2 years. Talk with your health care provider about how often you should have regular mammograms.  BRCA-related cancer screening. This may be done if you have a family history of breast, ovarian, tubal, or peritoneal cancers. Other tests  Sexually transmitted disease (STD) testing.  Bone density scan. This is done to screen for osteoporosis. You may have this done starting at age 31. Follow these instructions at home: Eating and drinking  Eat a diet that includes fresh fruits and vegetables, whole grains, lean protein, and low-fat dairy products. Limit your intake of foods with high amounts of sugar, saturated fats, and salt.  Take vitamin and mineral supplements as recommended by your health care provider.  Do not drink alcohol if your health care provider tells you not to drink.  If you drink alcohol: ? Limit how much you have to 0-1 drink a day. ? Be aware of how much alcohol is in your drink. In the [place] one drink equals one 12 oz bottle of beer (355 mL), one 5 oz glass of wine (148 mL), or one 1 oz glass of hard liquor (44 mL). Lifestyle  Take daily care of your teeth and gums.  Stay active. Exercise for at least 30 minutes on 5 or more days each week.  Do not use any products that contain nicotine or tobacco, such as cigarettes, e-cigarettes, and chewing tobacco. If  you need help quitting, ask your health care provider.  If you are sexually active, practice safe sex. Use a condom or other form of protection in order to prevent STIs (sexually transmitted infections).  Talk with your health care provider about taking a low-dose aspirin or statin. What's next?  Go to your health care provider once a year for a well check visit.  Ask your health care provider how often you should have your eyes and teeth checked.  Stay up to date on all vaccines. This information is not intended to replace advice given to you by your health care provider. Make sure you discuss any questions you have with your health care provider. Document Revised: 01/26/2018 Document Reviewed: 01/26/2018 Elsevier Patient Education  2020 [employer].

## 2019-02-22 ENCOUNTER — Ambulatory Visit (HOSPITAL_COMMUNITY)
Admission: RE | Admit: 2019-02-22 | Discharge: 2019-02-22 | Disposition: A | Discharge status: Home / Self Care | Payer: Medicare Other | Source: Ambulatory Visit | Attending: Pulmonary Disease | Admitting: Pulmonary Disease

## 2019-02-22 DIAGNOSIS — Z23 Encounter for immunization: Secondary | ICD-10-CM | POA: Diagnosis not present

## 2019-02-22 DIAGNOSIS — U071 COVID-19: Secondary | ICD-10-CM | POA: Diagnosis present

## 2019-02-22 MED ORDER — SODIUM CHLORIDE 0.9 % IV SOLN
700.0000 mg | Freq: Once | INTRAVENOUS | Status: AC
  Administered 2019-02-22: 700 mg via INTRAVENOUS
  Filled 2019-02-22: qty 20

## 2019-02-22 MED ORDER — FAMOTIDINE IN NACL 20-0.9 MG/50ML-% IV SOLN: 20.0000 mg | Freq: Once | INTRAVENOUS | Status: DC | PRN

## 2019-02-22 MED ORDER — EPINEPHRINE 0.3 MG/0.3ML IJ SOAJ: 0.3000 mg | Freq: Once | INTRAMUSCULAR | Status: DC | PRN

## 2019-02-22 MED ORDER — DIPHENHYDRAMINE HCL 50 MG/ML IJ SOLN: 50.0000 mg | Freq: Once | INTRAMUSCULAR | Status: DC | PRN

## 2019-02-22 MED ORDER — ALBUTEROL SULFATE HFA 108 (90 BASE) MCG/ACT IN AERS: 2.0000 | INHALATION_SPRAY | Freq: Once | RESPIRATORY_TRACT | Status: DC | PRN

## 2019-02-22 MED ORDER — METHYLPREDNISOLONE SODIUM SUCC 125 MG IJ SOLR: 125.0000 mg | Freq: Once | INTRAMUSCULAR | Status: DC | PRN

## 2019-02-22 MED ORDER — SODIUM CHLORIDE 0.9 % IV SOLN
INTRAVENOUS | Status: DC | PRN
  Administered 2019-02-22: 250 mL via INTRAVENOUS

## 2019-02-22 NOTE — Discharge Instructions (Signed)

## 2019-02-22 NOTE — Progress Notes (Signed)
  Diagnosis: COVID-19  Physician: Dr. Zigmund Daniel  Procedure: [facility] Med: bamlanivimab infusion - Provided patient with bamlanimivab fact sheet for patients, parents and caregivers prior to infusion.  Complications: No immediate complications noted.  Discharge: Discharged home   Aubriegh Minch N Morry Veiga 02/22/2019

## 2019-04-09 ENCOUNTER — Encounter: Payer: Self-pay | Admitting: Family Medicine

## 2019-04-09 ENCOUNTER — Telehealth (INDEPENDENT_AMBULATORY_CARE_PROVIDER_SITE_OTHER): Payer: Medicare Other | Admitting: Family Medicine

## 2019-04-09 DIAGNOSIS — H1033 Unspecified acute conjunctivitis, bilateral: Secondary | ICD-10-CM

## 2019-04-09 DIAGNOSIS — H109 Unspecified conjunctivitis: Secondary | ICD-10-CM | POA: Insufficient documentation

## 2019-04-09 MED ORDER — POLYMYXIN B-TRIMETHOPRIM 10000-0.1 UNIT/ML-% OP SOLN: 1.0000 [drp] | OPHTHALMIC | 0 refills | Status: DC

## 2019-04-09 NOTE — Progress Notes (Signed)
Eyes are hurting. She reports she had a fall on the border of the garden area and tripped. She hit cheekbone and eyebrow.   Her eyes are red and hurting and is not sure if she got some bacteria.  She reports its looks like pink eye but has not been around anyone who has had pink eye.  She has rubbed her eyes and thinks this may have irritated the area.

## 2019-04-09 NOTE — Assessment & Plan Note (Addendum)
No red flags or vision changes noted at this time.  Will treat with polytrim drops.  Discussed if having worsening symptoms and/or vision changes she needs to be seen at urgent care or ED ASAP.  She expresses understanding.

## 2019-04-09 NOTE — Progress Notes (Signed)
Stephanie Ramsey - 69 y.o. female MRN [id number]  Date of birth: 11-18-1950   This visit type was conducted due to national recommendations for restrictions regarding the COVID-19 Pandemic (e.g. social distancing).  This format is felt to be most appropriate for this patient at this time.  All issues noted in this document were discussed and addressed.  No physical exam was performed (except for noted visual exam findings with Video Visits).  I discussed the limitations of evaluation and management by telemedicine and the availability of in person appointments. The patient expressed understanding and agreed to proceed.  I connected with@ on 04/09/19 at 10:10 AM EST by a video enabled telemedicine application and verified that I am speaking with the correct person using two identifiers.  Present at visit: Stephanie Nutting, DO [place]   Patient Location: Home [address] [place] [address]   Provider location:   [facility]  Chief Complaint  Patient presents with  . Fall    HPI  Stephanie Ramsey is a 69 y.o. female who presents via audio/video conferencing for a telehealth visit today.  She has complaint of eye irritation.  She reports that she is pet-sitting and was playing with dog in the backyard over the weekend.  Unfortunately she tripped over border of garden and fell hitting her brow and just below the eye.  She had some swelling around the eye and bruising.  This has improved some with icing.  Noticed some redness of the eye  Itself yesterday.  Mild irritation and thick yellowish discharge.  Denies pain of the eye and/or vision changes.  She does not wear contact lenses.  She did notice discharge and mild redness for L eye today.  She has not had associated sinus pain, fever, chills, headache.      ROS:  A comprehensive ROS was completed and negative except as noted per HPI  Past Medical History:  Diagnosis Date  . Arthritis   . Cancer ([place])    right breast  . Carpal tunnel  syndrome   . Chronic back pain   . Complication of anesthesia    "stopped breathing during procedure and dizziness after procedures"  . Hypercholesterolemia   . Hypothyroidism   . Pneumonia   . Ulcerative colitis [facility])     Past Surgical History:  Procedure Laterality Date  . BILATERAL CARPAL TUNNEL RELEASE    . 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 BREAST LUMPECTOMY Right 2011   in situ   . BREAST SURGERY     biopsy and lumpectomy right breast  . CHOLECYSTECTOMY    . COLONOSCOPY W/ BIOPSIES AND POLYPECTOMY    . JOINT REPLACEMENT     Total right hip  . 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   Procedure: LEFT TOTAL HIP ARTHROPLASTY;  Surgeon: Garald Balding, MD;  Location: [facility];  Service: Orthopedics;  Laterality: Left;  . TRIGGER FINGER RELEASE      Family History  Problem Relation Age of Onset  . Breast cancer Mother   . Arthritis Other     Social History   Socioeconomic History  . Marital status: Married    Spouse name: Not on file  . Number of children: Not on file  . Years of education: Not on file  . Highest education level: Not on file  Occupational History  . Not on file  Tobacco Use  . Smoking status: Former Smoker    Types: Cigarettes  .  Smokeless tobacco: Never Used  . Tobacco comment: quit smoking cigarettes 32 years ago  Substance and Sexual Activity  . Alcohol use: Yes    Comment: social  . Drug use: No  . Sexual activity: Yes    Birth control/protection: None  Other Topics Concern  . Not on file  Social History Narrative  . Not on file   Social Determinants of Health   Financial Resource Strain: Low Risk   . Difficulty of Paying Living Expenses: Not very hard  Food Insecurity: No Food Insecurity  . Worried About Charity fundraiser in the Last Year: Never true  . Ran Out of Food in the Last Year: Never true  Transportation Needs: No Transportation Needs  . Lack of Transportation (Medical): No  . Lack of Transportation  (Non-Medical): No  Physical Activity: Unknown  . Days of Exercise per Week: 3 days  . Minutes of Exercise per Session: Not on file  Stress: Stress Concern Present  . Feeling of Stress : To some extent  Social Connections: Unknown  . Frequency of Communication with Friends and Family: Three times a week  . Frequency of Social Gatherings with Friends and Family: Three times a week  . Attends Religious Services: Not on file  . Active Member of Clubs or Organizations: Not on file  . Attends Archivist Meetings: Not on file  . Marital Status: Married  Human resources officer Violence: Not At Risk  . Fear of Current or Ex-Partner: No  . Emotionally Abused: No  . Physically Abused: No  . Sexually Abused: No     Current Outpatient Medications:  .  atorvastatin (LIPITOR) 20 MG tablet, TAKE 1 TABLET(20 MG) BY MOUTH DAILY, Disp: 90 tablet, Rfl: 3 .  Calcium Carb-Cholecalciferol (CALCIUM + D3) 600-200 MG-UNIT TABS, Take 1 tablet by mouth daily., Disp: , Rfl:  .  calcium-vitamin D (OSCAL WITH D) 500-200 MG-UNIT tablet, Take 1 tablet by mouth., Disp: , Rfl:  .  Cinnamon 500 MG capsule, Take 500 mg by mouth daily., Disp: , Rfl:  .  cyclobenzaprine (FLEXERIL) 5 MG tablet, Take 5 mg by mouth 3 (three) times daily as needed for muscle spasms., Disp: , Rfl:  .  diclofenac sodium (VOLTAREN) 1 % GEL, APP 2 TO 4 GRAMS AA BID PRN P., Disp: , Rfl:  .  folic acid (FOLVITE) 779 MCG tablet, Take 400 mcg by mouth daily., Disp: , Rfl:  .  gabapentin (NEURONTIN) 300 MG capsule, Take 1 capsule (300 mg total) by mouth 2 (two) times daily., Disp: 180 capsule, Rfl: 1 .  Ibuprofen-Famotidine (DUEXIS) 800-26.6 MG TABS, Take by mouth., Disp: , Rfl:  .  levothyroxine (SYNTHROID) 150 MCG tablet, TAKE 1 TABLET BY MOUTH EVERY DAY, Disp: 90 tablet, Rfl: 3 .  Magnesium 500 MG CAPS, Take by mouth., Disp: , Rfl:  .  Multiple Vitamins-Minerals (MULTIVITAMIN WITH MINERALS) tablet, Take 1 tablet by mouth daily., Disp: , Rfl:   .  Omega-3 Fatty Acids (FISH OIL) 1360 MG CAPS, Take by mouth., Disp: , Rfl:  .  potassium gluconate (RA POTASSIUM GLUCONATE) 595 (99 K) MG TABS tablet, Take 595 mg by mouth., Disp: , Rfl:  .  sulfaSALAzine (AZULFIDINE) 500 MG EC tablet, Take 1,000 mg by mouth 2 (two) times daily., Disp: , Rfl:  .  tiZANidine (ZANAFLEX) 2 MG tablet, Take by mouth every 6 (six) hours as needed for muscle spasms., Disp: , Rfl:  .  Ubiquinol 200 MG CAPS, Take by mouth., Disp: ,  Rfl:  .  valACYclovir (VALTREX) 500 MG tablet, Take 500 mg by mouth 2 (two) times daily., Disp: , Rfl:  .  vitamin B-12 (CYANOCOBALAMIN) 1000 MCG tablet, Take 1,000 mcg by mouth daily., Disp: , Rfl:  .  trimethoprim-polymyxin b (POLYTRIM) ophthalmic solution, Place 1 drop into both eyes every 4 (four) hours., Disp: 10 mL, Rfl: 0  EXAM:  VITALS per patient if applicable: Ht 5' 3"  (1.6 m)   Wt 145 lb (65.8 kg)   BMI 25.69 kg/m   GENERAL: alert, oriented, appears well and in no acute distress  HEENT: Bruising around orbit of R eye, mild swelling.  Mild erythema of sclera. Globes non-tender to self palpation.   NECK: normal movements of the head and neck  LUNGS: on inspection no signs of respiratory distress, breathing rate appears normal, no obvious gross SOB, gasping or wheezing  CV: no obvious cyanosis  MS: moves all visible extremities without noticeable abnormality  PSYCH/NEURO: pleasant and cooperative, no obvious depression or anxiety, speech and thought processing grossly intact  ASSESSMENT AND PLAN:  Discussed the following assessment and plan:  Conjunctivitis No red flags or vision changes noted at this time.  Will treat with polytrim drops.  Discussed if having worsening symptoms and/or vision changes she needs to be seen at urgent care or ED ASAP.  She expresses understanding.      I discussed the assessment and treatment plan with the patient. The patient was provided an opportunity to ask questions and all  were answered. The patient agreed with the plan and demonstrated an understanding of the instructions.   The patient was advised to call back or seek an in-person evaluation if the symptoms worsen or if the condition fails to improve as anticipated.    Stephanie Nutting, DO

## 2019-06-05 ENCOUNTER — Encounter: Payer: Self-pay | Admitting: Family Medicine

## 2019-07-18 ENCOUNTER — Ambulatory Visit (INDEPENDENT_AMBULATORY_CARE_PROVIDER_SITE_OTHER): Payer: Medicare Other | Admitting: Family Medicine

## 2019-07-18 ENCOUNTER — Encounter: Payer: Self-pay | Admitting: Family Medicine

## 2019-07-18 VITALS — BP 143/87 | HR 70 | Temp 98.7°F | Ht 62.99 in | Wt 140.1 lb

## 2019-07-18 DIAGNOSIS — H0100A Unspecified blepharitis right eye, upper and lower eyelids: Secondary | ICD-10-CM | POA: Diagnosis not present

## 2019-07-18 DIAGNOSIS — E039 Hypothyroidism, unspecified: Secondary | ICD-10-CM | POA: Diagnosis not present

## 2019-07-18 DIAGNOSIS — H0100B Unspecified blepharitis left eye, upper and lower eyelids: Secondary | ICD-10-CM

## 2019-07-18 DIAGNOSIS — E785 Hyperlipidemia, unspecified: Secondary | ICD-10-CM

## 2019-07-18 DIAGNOSIS — H01006 Unspecified blepharitis left eye, unspecified eyelid: Secondary | ICD-10-CM | POA: Insufficient documentation

## 2019-07-18 DIAGNOSIS — H01003 Unspecified blepharitis right eye, unspecified eyelid: Secondary | ICD-10-CM | POA: Insufficient documentation

## 2019-07-18 DIAGNOSIS — M8580 Other specified disorders of bone density and structure, unspecified site: Secondary | ICD-10-CM | POA: Diagnosis not present

## 2019-07-18 DIAGNOSIS — F418 Other specified anxiety disorders: Secondary | ICD-10-CM | POA: Insufficient documentation

## 2019-07-18 MED ORDER — ATORVASTATIN CALCIUM 20 MG PO TABS: ORAL_TABLET | ORAL | 3 refills | Status: DC

## 2019-07-18 NOTE — Assessment & Plan Note (Signed)
Update TSH today.  No symptoms at current dose of levothyroxine.

## 2019-07-18 NOTE — Assessment & Plan Note (Signed)
-  Discussed both pharmacetical and non-pharmaceutical options for management of her symptoms.  -Plans to get new dog which she feels will give her a renewed sense of purpose and keep her busy.   -She may try valerian root at night initially.  -Discussed trial of talk therapy and/or rx medication, she will let me know if she decides to try either of these.

## 2019-07-18 NOTE — Assessment & Plan Note (Signed)
Ocular symptoms consistent with blepharitis.  Recommend warm compresses 2-3x per day. Can try baby shampoo washes if able to tolerate.  Has appt with eye doctor as well next week.

## 2019-07-18 NOTE — Assessment & Plan Note (Signed)
She will continue calcium + D supplement.  Repeat DEXA in 1-2 years.

## 2019-07-18 NOTE — Assessment & Plan Note (Signed)
She is tolerating atorvastatin well.  Update lipid profile and LFT's

## 2019-07-18 NOTE — Progress Notes (Signed)
Stephanie Ramsey - 69 y.o. female MRN [id number]  Date of birth: 09-Apr-1950  Subjective Chief Complaint  Patient presents with  . Follow-up    HPI Stephanie Ramsey is a 69 y.o. female with history of hypothyroidism, ulcerative colitis, HLD and osteopenia here today for follow up.  She has several items she would like to discuss.   -Eye irritation:  Reports feeling of grittiness in her eyes, worse in the evening.  Has matting when she wakes in the morning.  She denies pain or vision changes.  She had eye infection in February consistent with conjunctivitis that resolved with drops.  She does have appt with her eye doctor next week.   -Osteopenia:  History of osteopenia with T-score of -1.4 in 2018.  She is taking calcium + vitamin D supplement.    -Depression/anxiety: Reports increased feelings of depression with anxiety.  She states that's she hasn't felt herself for several months.  She denies any significant stressors.  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  She did try ashwagandha but this caused some GI upset.  She is on the fence about trying prescription options.  She does have difficulty in the evenings with sleep due to this.  She did take vacation earlier this year and friends had dog which she bonded with and made her feel happy.    -Hypothyroidism:  She denies any symptoms related to hypo/hyper-thyroidism.  Compliant with current dose of medication.    -HLD:  Tolerating atorvastatin well.  Denies myalgias related to use of medication.    ROS:  A comprehensive ROS was completed and negative except as noted per HPI  Allergies  Allergen Reactions  . Oxycodone Swelling and Other (See Comments)    DROPS BP  . Penicillins Rash    Past Medical History:  Diagnosis Date  . Arthritis   . Cancer ([place])    right breast  . Carpal tunnel syndrome   . Chronic back pain   . Complication of anesthesia    "stopped breathing  during procedure and dizziness after procedures"  . Hypercholesterolemia   . Hypothyroidism   . Pneumonia   . Ulcerative colitis [facility])     Past Surgical History:  Procedure Laterality Date  . BILATERAL CARPAL TUNNEL RELEASE    . 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 BREAST LUMPECTOMY Right 2011   in situ   . BREAST SURGERY     biopsy and lumpectomy right breast  . CHOLECYSTECTOMY    . COLONOSCOPY W/ BIOPSIES AND POLYPECTOMY    . JOINT REPLACEMENT     Total right hip  . 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   Procedure: LEFT TOTAL HIP ARTHROPLASTY;  Surgeon: Garald Balding, MD;  Location: [facility];  Service: Orthopedics;  Laterality: Left;  . TRIGGER FINGER RELEASE      Social History   Socioeconomic History  . Marital status: Married    Spouse name: Not on file  . Number of children: Not on file  . Years of education: Not on file  . Highest education level: Not on file  Occupational History  . Not on file  Tobacco Use  . Smoking status: Former Smoker    Types: Cigarettes  . Smokeless tobacco: Never Used  . Tobacco comment: quit smoking cigarettes 32 years ago  Substance and Sexual Activity  . Alcohol use: Yes  Comment: social  . Drug use: No  . Sexual activity: Yes    Birth control/protection: None  Other Topics Concern  . Not on file  Social History Narrative  . Not on file   Social Determinants of Health   Financial Resource Strain: Low Risk   . Difficulty of Paying Living Expenses: Not very hard  Food Insecurity: No Food Insecurity  . Worried About Charity fundraiser in the Last Year: Never true  . Ran Out of Food in the Last Year: Never true  Transportation Needs: No Transportation Needs  . Lack of Transportation (Medical): No  . Lack of Transportation (Non-Medical): No  Physical Activity: Unknown  . Days of Exercise per Week: 3 days  . Minutes of Exercise per Session: Not on file  Stress: Stress Concern Present  . Feeling of Stress : To  some extent  Social Connections: Unknown  . Frequency of Communication with Friends and Family: Three times a week  . Frequency of Social Gatherings with Friends and Family: Three times a week  . Attends Religious Services: Not on file  . Active Member of Clubs or Organizations: Not on file  . Attends Archivist Meetings: Not on file  . Marital Status: Married    Family History  Problem Relation Age of Onset  . Breast cancer Mother   . Arthritis Other     Health Maintenance  Topic Date Due  . Hepatitis C Screening  Never done  . DEXA SCAN  Never done  . PNA vac Low Risk Adult (2 of 2 - PPSV23) 04/08/2020 (Originally 01/03/2019)  . INFLUENZA VACCINE  09/16/2019  . MAMMOGRAM  12/17/2020  . TETANUS/TDAP  05/04/2026  . COLONOSCOPY  01/16/2028  . COVID-19 Vaccine  Completed     ----------------------------------------------------------------------------------------------------------------------------------------------------------------------------------------------------------------- Physical Exam BP (!) 143/87 (BP Location: Left Arm, Patient Position: Sitting, Cuff Size: Normal)   Pulse 70   Temp 98.7 F (37.1 C) (Oral)   Ht 5' 2.99" (1.6 m)   Wt 140 lb 1.6 oz (63.5 kg)   SpO2 100%   BMI 24.82 kg/m   Physical Exam Constitutional:      Appearance: Normal appearance.  HENT:     Head: Normocephalic and atraumatic.  Eyes:     General: No scleral icterus. Cardiovascular:     Rate and Rhythm: Normal rate and regular rhythm.  Pulmonary:     Effort: Pulmonary effort is normal.     Breath sounds: Normal breath sounds.  Musculoskeletal:     Cervical back: Neck supple.  Neurological:     General: No focal deficit present.     Mental Status: She is alert.  Psychiatric:        Mood and Affect: Mood normal.        Behavior: Behavior normal.      ------------------------------------------------------------------------------------------------------------------------------------------------------------------------------------------------------------------- Assessment and Plan  Hypothyroidism Update TSH today.  No symptoms at current dose of levothyroxine.   Hyperlipidemia She is tolerating atorvastatin well.  Update lipid profile and LFT's  Blepharitis of both eyes Ocular symptoms consistent with blepharitis.  Recommend warm compresses 2-3x per day. Can try baby shampoo washes if able to tolerate.  Has appt with eye doctor as well next week.   Osteopenia She will continue calcium + D supplement.  Repeat DEXA in 1-2 years.    Depression with anxiety -Discussed both pharmacetical and non-pharmaceutical options for management of her symptoms.  -Plans to get new dog which she feels will give her a renewed sense  of purpose and keep her busy.   -She may try valerian root at night initially.  -Discussed trial of talk therapy and/or rx medication, she will let me know if she decides to try either of these.     No orders of the defined types were placed in this encounter.   Return in about 6 months (around 01/17/2020) for Hypothyroidism.    This visit occurred during the SARS-CoV-2 public health emergency.  Safety protocols were in place, including screening questions prior to the visit, additional usage of staff PPE, and extensive cleaning of exam room while observing appropriate contact time as indicated for disinfecting solutions.

## 2019-07-18 NOTE — Patient Instructions (Addendum)
Great to see you today! You can try valerian root in the evening to help with sleep/anxiety. If you decide you want to try prescription options I would recommend lexapro (escitalopram)  Bone density test was in 2018 and showed osteopenia (moderate bone thinning) at that time.  I think we can repeat this next year.   Please have labs completed, we'll be in touch with results.    Blepharitis Blepharitis is inflammation of the eyelids. Blepharitis may happen with:  Reddish, scaly skin around the scalp and eyebrows.  Burning or itching of the eyelids.  Eye discharge at night that causes the eyelashes to stick together in the morning.  Eyelashes that fall out.  Sensitivity to light. Follow these instructions at home: Pay attention to any changes in how your eyes look or feel. Tell your health care provider about any changes. Follow these instructions to help with your condition. Keeping Coca-Cola your hands often.  Wash your eyelids with warm water or with warm water that is mixed with a small amount of baby shampoo. Do this two times per day or as often as needed.  Wash your face and eyebrows at least once a day.  Use a clean towel each time you dry your eyelids. Do not use this towel to clean or dry other areas of your body. Do not share your towel with anyone. General instructions  Avoid wearing makeup until you get better. Do not share makeup with anyone.  Avoid rubbing your eyes.  Apply warm compresses to your eyes 2 times per day for 10 minutes at a time, or as told by your health care provider.  If you were prescribed an antibiotic ointment or steroid drops, apply or use the medicine as told by your health care provider. Do not stop using the medicine even if you feel better.  Keep all follow-up visits as told by your health care provider. This is important. Contact a health care provider if:  Your eyelids feel hot.  You have blisters or a rash on your  eyelids.  The condition does not go away in 2-4 days.  The inflammation gets worse. Get help right away if:  You have pain or redness that gets worse or spreads to other parts of your face.  Your vision changes.  You have pain when looking at lights or moving objects.  You have a fever. Summary  Blepharitis is inflammation of the eyelids.  Pay attention to any changes in how your eyes look or feel. Tell your health care provider about any changes.  Follow home care instructions as told by your health care provider. Wash your hands often. Avoid wearing makeup. Do not rub your eyes.  To treat this condition, use warm compresses and prescription ointments or eye drops.  Let your health care provider know if you have vision changes, blisters or rash on eyelids, pain that spreads to your face, or warmth on your eyelids. This information is not intended to replace advice given to you by your health care provider. Make sure you discuss any questions you have with your health care provider. Document Revised: 07/25/2017 Document Reviewed: 07/25/2017 Elsevier Patient Education  [place].

## 2019-07-25 LAB — TSH: TSH: 1.92 mIU/L (ref 0.40–4.50)

## 2019-07-25 LAB — COMPLETE METABOLIC PANEL WITH GFR
AG Ratio: 2 (calc) (ref 1.0–2.5)
ALT: 16 U/L (ref 6–29)
AST: 22 U/L (ref 10–35)
Albumin: 4.3 g/dL (ref 3.6–5.1)
Alkaline phosphatase (APISO): 57 U/L (ref 37–153)
BUN: 11 mg/dL (ref 7–25)
CO2: 28 mmol/L (ref 20–32)
Calcium: 9.6 mg/dL (ref 8.6–10.4)
Chloride: 104 mmol/L (ref 98–110)
Creat: 0.7 mg/dL (ref 0.50–0.99)
GFR, Est African American: 102 mL/min/{1.73_m2} (ref >=60)
GFR, Est Non African American: 88 mL/min/{1.73_m2} (ref >=60)
Globulin: 2.1 g/dL (calc) (ref 1.9–3.7)
Glucose, Bld: 100 mg/dL — ABNORMAL HIGH (ref 65–99)
Potassium: 4.1 mmol/L (ref 3.5–5.3)
Sodium: 140 mmol/L (ref 135–146)
Total Bilirubin: 0.5 mg/dL (ref 0.2–1.2)
Total Protein: 6.4 g/dL (ref 6.1–8.1)

## 2019-07-25 LAB — CBC
HCT: 39.9 % (ref 35.0–45.0)
Hemoglobin: 13.3 g/dL (ref 11.7–15.5)
MCH: 32.9 pg (ref 27.0–33.0)
MCHC: 33.3 g/dL (ref 32.0–36.0)
MCV: 98.8 fL (ref 80.0–100.0)
MPV: 9.3 fL (ref 7.5–12.5)
Platelets: 237 10*3/uL (ref 140–400)
RBC: 4.04 10*6/uL (ref 3.80–5.10)
RDW: 12.4 % (ref 11.0–15.0)
WBC: 3.2 10*3/uL — ABNORMAL LOW (ref 3.8–10.8)

## 2019-07-25 LAB — LIPID PANEL
Cholesterol: 207 mg/dL — ABNORMAL HIGH (ref <200)
HDL: 84 mg/dL (ref >=50)
LDL Cholesterol (Calc): 104 mg/dL (calc) — ABNORMAL HIGH
Non-HDL Cholesterol (Calc): 123 mg/dL (calc) (ref <130)
Total CHOL/HDL Ratio: 2.5 (calc) (ref <5.0)
Triglycerides: 91 mg/dL (ref <150)

## 2019-08-16 ENCOUNTER — Encounter: Payer: Self-pay | Admitting: Family Medicine

## 2019-08-16 ENCOUNTER — Ambulatory Visit (INDEPENDENT_AMBULATORY_CARE_PROVIDER_SITE_OTHER): Payer: Medicare Other | Admitting: Family Medicine

## 2019-08-16 VITALS — Ht 62.99 in | Wt 139.6 lb

## 2019-08-16 DIAGNOSIS — H8111 Benign paroxysmal vertigo, right ear: Secondary | ICD-10-CM

## 2019-08-16 NOTE — Progress Notes (Signed)
Stephanie Ramsey - 68 y.o. female MRN [id number]  Date of birth: 27-Dec-1950  Subjective Chief Complaint  Patient presents with  . Dizziness    HPI Stephanie Ramsey is a 69 y.o. female here today with complaint of dizziness.  She reports intermittent episodes of dizziness when turning over in bed or sitting up/laying down to quickly.  Seems worse on the R side.  Symptoms last a few seconds to 1 minute before subsiding. She has not had any recent illness and denies congestion or ear pressure.  She does not have associated nausea with this.  She denies an increase in headaches or fatigue.  So far she has not tried anything for symptom management.   ROS:  A comprehensive ROS was completed and negative except as noted per HPI  Allergies  Allergen Reactions  . Oxycontin [Oxycodone] Swelling and Other (See Comments)    HYPOTENSION  . Penicillins Rash    Past Medical History:  Diagnosis Date  . Arthritis   . Cancer ([place])    right breast  . Carpal tunnel syndrome   . Chronic back pain   . Complication of anesthesia    "stopped breathing during procedure and dizziness after procedures"  . Hypercholesterolemia   . Hypothyroidism   . Pneumonia   . Ulcerative colitis [facility])     Past Surgical History:  Procedure Laterality Date  . BILATERAL CARPAL TUNNEL RELEASE    . 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 BREAST LUMPECTOMY Right 2011   in situ   . BREAST SURGERY     biopsy and lumpectomy right breast  . CHOLECYSTECTOMY    . COLONOSCOPY W/ BIOPSIES AND POLYPECTOMY    . JOINT REPLACEMENT     Total right hip  . 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   Procedure: LEFT TOTAL HIP ARTHROPLASTY;  Surgeon: Garald Balding, MD;  Location: [place];  Service: Orthopedics;  Laterality: Left;  . TRIGGER FINGER RELEASE      Social History   Socioeconomic History  . Marital status: Married    Spouse name: Not on file  . Number of children: Not on file  . Years of education: Not on file  .  Highest education level: Not on file  Occupational History  . Not on file  Tobacco Use  . Smoking status: Former Smoker    Types: Cigarettes  . Smokeless tobacco: Never Used  . Tobacco comment: quit smoking cigarettes 32 years ago  Vaping Use  . Vaping Use: Never used  Substance and Sexual Activity  . Alcohol use: Yes    Comment: social  . Drug use: No  . Sexual activity: Yes    Birth control/protection: None  Other Topics Concern  . Not on file  Social History Narrative  . Not on file   Social Determinants of Health   Financial Resource Strain: Low Risk   . Difficulty of Paying Living Expenses: Not very hard  Food Insecurity: No Food Insecurity  . Worried About Charity fundraiser in the Last Year: Never true  . Ran Out of Food in the Last Year: Never true  Transportation Needs: No Transportation Needs  . Lack of Transportation (Medical): No  . Lack of Transportation (Non-Medical): No  Physical Activity: Unknown  . Days of Exercise per Week: 3 days  . Minutes of Exercise per Session: Not on file  Stress: Stress Concern Present  . Feeling of Stress : To some extent  Social  Connections: Unknown  . Frequency of Communication with Friends and Family: Three times a week  . Frequency of Social Gatherings with Friends and Family: Three times a week  . Attends Religious Services: Not on file  . Active Member of Clubs or Organizations: Not on file  . Attends Archivist Meetings: Not on file  . Marital Status: Married    Family History  Problem Relation Age of Onset  . Breast cancer Mother   . Arthritis Other     Health Maintenance  Topic Date Due  . Hepatitis C Screening  Never done  . DEXA SCAN  Never done  . PNA vac Low Risk Adult (2 of 2 - PPSV23) 04/08/2020 (Originally 01/03/2019)  . INFLUENZA VACCINE  09/16/2019  . MAMMOGRAM  12/17/2020  . TETANUS/TDAP  05/04/2026  . COLONOSCOPY  01/16/2028  . COVID-19 Vaccine  Completed      ----------------------------------------------------------------------------------------------------------------------------------------------------------------------------------------------------------------- Physical Exam Ht 5' 2.99" (1.6 m)   Wt 139 lb 9.6 oz (63.3 kg)   SpO2 98%   BMI 24.74 kg/m   Physical Exam Constitutional:      Appearance: Normal appearance.  HENT:     Head: Normocephalic and atraumatic.  Cardiovascular:     Rate and Rhythm: Normal rate and regular rhythm.  Musculoskeletal:     Cervical back: Neck supple.  Skin:    General: Skin is warm and dry.  Neurological:     General: No focal deficit present.     Mental Status: She is alert.     Cranial Nerves: No cranial nerve deficit.     Gait: Gait normal.     Comments: + Dix-Hallpike to the R.   No nystagmus.  Symptoms lasting about 10 seconds.   Psychiatric:        Mood and Affect: Mood normal.        Behavior: Behavior normal.     ------------------------------------------------------------------------------------------------------------------------------------------------------------------------------------------------------------------- Assessment and Plan  Benign paroxysmal positional vertigo of right ear Recommend OTC meclizine as needed for symptom control/vestibular suppression.  Referral to vestibular rehab.  Advised caution with driving.  Call for new or worsening symptoms.   No orders of the defined types were placed in this encounter.   No follow-ups on file.    This visit occurred during the SARS-CoV-2 public health emergency.  Safety protocols were in place, including screening questions prior to the visit, additional usage of staff PPE, and extensive cleaning of exam room while observing appropriate contact time as indicated for disinfecting solutions.

## 2019-08-16 NOTE — Assessment & Plan Note (Signed)
Recommend OTC meclizine as needed for symptom control/vestibular suppression.  Referral to vestibular rehab.  Advised caution with driving.  Call for new or worsening symptoms.

## 2019-08-16 NOTE — Patient Instructions (Signed)
Benign Positional Vertigo Vertigo is the feeling that you or your surroundings are moving when they are not. Benign positional vertigo is the most common form of vertigo. This is usually a harmless condition (benign). This condition is positional. This means that symptoms are triggered by certain movements and positions. This condition can be dangerous if it occurs while you are doing something that could cause harm to you or others. This includes activities such as driving or operating machinery. What are the causes? In many cases, the cause of this condition is not known. It may be caused by a disturbance in an area of the inner ear that helps your brain to sense movement and balance. This disturbance can be caused by:  Viral infection (labyrinthitis).  Head injury.  Repetitive motion, such as jumping, dancing, or running. What increases the risk? You are more likely to develop this condition if:  You are a woman.  You are 54 years of age or older. What are the signs or symptoms? Symptoms of this condition usually happen when you move your head or your eyes in different directions. Symptoms may start suddenly, and usually last for less than a minute. They include:  Loss of balance and falling.  Feeling like you are spinning or moving.  Feeling like your surroundings are spinning or moving.  Nausea and vomiting.  Blurred vision.  Dizziness.  Involuntary eye movement (nystagmus). Symptoms can be mild and cause only minor problems, or they can be severe and interfere with daily life. Episodes of benign positional vertigo may return (recur) over time. Symptoms may improve over time. How is this diagnosed? This condition may be diagnosed based on:  Your medical history.  Physical exam of the head, neck, and ears.  Tests, such as: ? MRI. ? CT scan. ? Eye movement tests. Your health care provider may ask you to change positions quickly while he or she watches you for symptoms  of benign positional vertigo, such as nystagmus. Eye movement may be tested with a variety of exams that are designed to evaluate or stimulate vertigo. ? An electroencephalogram (EEG). This records electrical activity in your brain. ? Hearing tests. You may be referred to a health care provider who specializes in ear, nose, and throat (ENT) problems (otolaryngologist) or a provider who specializes in disorders of the nervous system (neurologist). How is this treated?  This condition may be treated in a session in which your health care provider moves your head in specific positions to adjust your inner ear back to normal. Treatment for this condition may take several sessions. Surgery may be needed in severe cases, but this is rare. In some cases, benign positional vertigo may resolve on its own in 2-4 weeks. Follow these instructions at home: Safety  Move slowly. Avoid sudden body or head movements or certain positions, as told by your health care provider.  Avoid driving until your health care provider says it is safe for you to do so.  Avoid operating heavy machinery until your health care provider says it is safe for you to do so.  Avoid doing any tasks that would be dangerous to you or others if vertigo occurs.  If you have trouble walking or keeping your balance, try using a cane for stability. If you feel dizzy or unstable, sit down right away.  Return to your normal activities as told by your health care provider. Ask your health care provider what activities are safe for you. General instructions  Take over-the-counter  and prescription medicines only as told by your health care provider.  Drink enough fluid to keep your urine pale yellow.  Keep all follow-up visits as told by your health care provider. This is important. Contact a health care provider if:  You have a fever.  Your condition gets worse or you develop new symptoms.  Your family or friends notice any  behavioral changes.  You have nausea or vomiting that gets worse.  You have numbness or a "pins and needles" sensation. Get help right away if you:  Have difficulty speaking or moving.  Are always dizzy.  Faint.  Develop severe headaches.  Have weakness in your legs or arms.  Have changes in your hearing or vision.  Develop a stiff neck.  Develop sensitivity to light. Summary  Vertigo is the feeling that you or your surroundings are moving when they are not. Benign positional vertigo is the most common form of vertigo.  The cause of this condition is not known. It may be caused by a disturbance in an area of the inner ear that helps your brain to sense movement and balance.  Symptoms include loss of balance and falling, feeling that you or your surroundings are moving, nausea and vomiting, and blurred vision.  This condition can be diagnosed based on symptoms, physical exam, and other tests, such as MRI, CT scan, eye movement tests, and hearing tests.  Follow safety instructions as told by your health care provider. You will also be told when to contact your health care provider in case of problems. This information is not intended to replace advice given to you by your health care provider. Make sure you discuss any questions you have with your health care provider. Document Revised: 07/13/2017 Document Reviewed: 07/13/2017 Elsevier Patient Education  [place].

## 2019-08-23 ENCOUNTER — Other Ambulatory Visit: Payer: Self-pay

## 2019-08-23 ENCOUNTER — Ambulatory Visit (INDEPENDENT_AMBULATORY_CARE_PROVIDER_SITE_OTHER): Payer: Medicare Other | Admitting: Rehabilitative and Restorative Service Providers"

## 2019-08-23 DIAGNOSIS — H8111 Benign paroxysmal vertigo, right ear: Secondary | ICD-10-CM

## 2019-08-23 NOTE — Therapy (Addendum)
[facility] [place], [place], [address] Phone: [phone]   Fax:  [phone]  Physical Therapy Evaluation and Discharge Summary  Patient Details  Name: Stephanie Ramsey MRN: [id number] Date of Birth: [birth year] Referring Provider (PT): [name], DO   Encounter Date: 08/23/2019   PT End of Session - 08/23/19 0842    Visit Number 1    Number of Visits 4    Date for PT Re-Evaluation 09/22/19    Authorization Type [employer] state health    PT Start Time 0800    PT Stop Time 0835    PT Time Calculation (min) 35 min           Past Medical History:  Diagnosis Date  . Arthritis   . Cancer ([place])    right breast  . Carpal tunnel syndrome   . Chronic back pain   . Complication of anesthesia    "stopped breathing during procedure and dizziness after procedures"  . Hypercholesterolemia   . Hypothyroidism   . Pneumonia   . Ulcerative colitis [facility])     Past Surgical History:  Procedure Laterality Date  . BILATERAL CARPAL TUNNEL RELEASE    . 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 BREAST LUMPECTOMY Right 2011   in situ   . BREAST SURGERY     biopsy and lumpectomy right breast  . CHOLECYSTECTOMY    . COLONOSCOPY W/ BIOPSIES AND POLYPECTOMY    . JOINT REPLACEMENT     Total right hip  . 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   Procedure: LEFT TOTAL HIP ARTHROPLASTY;  Surgeon: Garald Balding, MD;  Location: [place];  Service: Orthopedics;  Laterality: Left;  . TRIGGER FINGER RELEASE      There were no vitals filed for this visit.    Subjective Assessment - 08/23/19 0805    Subjective The patient reports onset of vertigo 3 weeks ago.  She notes room spinning that lasts for seconds with R head movement.    Pertinent History hypothyroidism, osteopenia    Patient Stated Goals Get rid of dizziness.    Currently in Pain? No/denies              [facility] PT Assessment - 08/23/19 0802      Assessment    Medical Diagnosis BPPV    Referring Provider (PT) [name], DO    Onset Date/Surgical Date 08/16/19      Precautions   Precautions None      Balance Screen   Has the patient fallen in the past 6 months No    Has the patient had a decrease in activity level because of a fear of falling?  No    Is the patient reluctant to leave their home because of a fear of falling?  No      Home Environment   Living Environment Private residence      Prior Function   Level of Independence Independent                  Vestibular Assessment - 08/23/19 0807      Vestibular Assessment   General Observation The patinet walks into clinic without device independently.        Symptom Behavior   Subjective history of current problem Patient had sudden onset of dizziness 3 weeks ago.    Type of Dizziness  Spinning    Frequency of Dizziness daily    Duration of  Dizziness seconds    Symptom Nature Positional    Aggravating Factors Turning head quickly;Supine to sit    Relieving Factors Head stationary    Progression of Symptoms Better      Oculomotor Exam   Oculomotor Alignment Normal    Ocular ROM WFLs    Spontaneous Absent    Gaze-induced  Absent    Smooth Pursuits Intact    Saccades Intact      Vestibulo-Ocular Reflex   VOR 1 Head Only (x 1 viewing) VOR x 1 at self regulated pace= no dizziness and able to maintain gaze stabilization    Comment head impulse test= positive to both sides for small amplitude refixation saccade.  No symptoms of dizziness and no report of visual blurring      Positional Testing   Dix-Hallpike Dix-Hallpike Right;Dix-Hallpike Left    Horizontal Canal Testing Horizontal Canal Right;Horizontal Canal Left      Dix-Hallpike Right   Dix-Hallpike Right Duration 20 seconds    Dix-Hallpike Right Symptoms Upbeat, right rotatory nystagmus      Dix-Hallpike Left   Dix-Hallpike Left Duration none    Dix-Hallpike Left Symptoms No nystagmus       Horizontal Canal Right   Horizontal Canal Right Duration none    Horizontal Canal Right Symptoms Normal      Horizontal Canal Left   Horizontal Canal Left Duration none    Horizontal Canal Left Symptoms Normal              Objective measurements completed on examination: See above findings.        Vestibular Treatment/Exercise - 08/23/19 5400      Vestibular Treatment/Exercise   Vestibular Treatment Provided Canalith Repositioning    Canalith Repositioning Epley Manuever Right       EPLEY MANUEVER RIGHT   Number of Reps  2    Overall Response Symptoms Resolved    Response Details  retested without nystagmus noted on 2nd rep                      PT Long Term Goals - 08/23/19 8676      PT LONG TERM GOAL #1   Title The patient will have negative positional testing for R BPPV.    Time 4    Period Weeks    Target Date 09/22/19      PT LONG TERM GOAL #2   Title The patient will report no vertigo during bed mobility.    Time 4    Period Weeks    Target Date 09/22/19      PT LONG TERM GOAL #3   Title The patient will reduce functional limitation from 24% to < or equal to 12%.    Time 4    Period Weeks    Target Date 09/22/19                  Plan - 08/23/19 0844    Clinical Impression Statement The patient is a 69 yo female presenting to OP physical therapy with R BPPV (posterior canalithiasis).  She responded well to R epley's maneuver with no nystagmus viewed on 2nd repetition. PT to address deficits to return to prior functional status.    Examination-Activity Limitations Bed Mobility    Stability/Clinical Decision Making Stable/Uncomplicated    Clinical Decision Making Low    Rehab Potential Good    PT Frequency 1x / week    PT Duration 4 weeks    PT  Treatment/Interventions ADLs/Self Care Home Management;Canalith Repostioning;Vestibular;Therapeutic exercise;Neuromuscular re-education;Patient/family education    PT Next Visit Plan  recheck for BPPV and treat as needed; may need VOR x 1 viewing (will recheck after BPPV cleared)    Consulted and Agree with Plan of Care Patient           Patient will benefit from skilled therapeutic intervention in order to improve the following deficits and impairments:  Dizziness, Decreased activity tolerance  Visit Diagnosis: BPPV (benign paroxysmal positional vertigo), right     Problem List Patient Active Problem List   Diagnosis Date Noted  . Benign paroxysmal positional vertigo of right ear 08/16/2019  . Blepharitis of both eyes 07/18/2019  . Depression with anxiety 07/18/2019  . Conjunctivitis 04/09/2019  . Anxiety 08/17/2018  . History of adenomatous polyp of colon 01/26/2018  . Trigger ring finger of right hand 11/16/2017  . Leg cramps 06/02/2016  . Annual physical exam 08/11/2015  . Hyperlipidemia 07/26/2015  . Hypothyroidism 07/26/2015  . Ulcerative colitis ([place]) 07/26/2015  . Subcutaneous nodules 07/26/2015  . Pain in joints 07/26/2015  . Osteopenia 07/26/2015  . Neutropenia ([place]) 07/26/2015  . Neck muscle spasm 07/26/2015  . Malignant neoplasm of breast ([place]) 07/26/2015  . Limb swelling 07/26/2015  . 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 IFG (impaired fasting glucose) 07/26/2015  . 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 Primary osteoarthritis of left hip 01/03/2014  . 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  Please refer to initial evaluation for patient status. Thank you for the referral of this patient. Rudell Cobb, MPT     [place], [place] 08/23/2019, 8:46 AM  [facility] [place], [place], [address] Phone: [phone]   Fax:  [phone]  Name: Stephanie Ramsey MRN: [id number] Date of Birth: [birth year]

## 2019-08-30 ENCOUNTER — Encounter: Payer: Medicare Other | Admitting: Rehabilitative and Restorative Service Providers"

## 2019-10-08 ENCOUNTER — Other Ambulatory Visit: Payer: Self-pay | Admitting: Family Medicine

## 2019-12-10 ENCOUNTER — Other Ambulatory Visit: Payer: Self-pay | Admitting: Family Medicine

## 2019-12-10 ENCOUNTER — Ambulatory Visit (INDEPENDENT_AMBULATORY_CARE_PROVIDER_SITE_OTHER): Payer: Medicare Other | Admitting: Family Medicine

## 2019-12-10 ENCOUNTER — Other Ambulatory Visit: Payer: Self-pay

## 2019-12-10 ENCOUNTER — Encounter: Payer: Self-pay | Admitting: Family Medicine

## 2019-12-10 VITALS — BP 153/80 | HR 65 | Wt 147.0 lb

## 2019-12-10 DIAGNOSIS — M51369 Other intervertebral disc degeneration, lumbar region without mention of lumbar back pain or lower extremity pain: Secondary | ICD-10-CM | POA: Insufficient documentation

## 2019-12-10 DIAGNOSIS — M5136 Other intervertebral disc degeneration, lumbar region: Secondary | ICD-10-CM | POA: Insufficient documentation

## 2019-12-10 DIAGNOSIS — Z1231 Encounter for screening mammogram for malignant neoplasm of breast: Secondary | ICD-10-CM

## 2019-12-10 DIAGNOSIS — M2550 Pain in unspecified joint: Secondary | ICD-10-CM

## 2019-12-10 DIAGNOSIS — M5416 Radiculopathy, lumbar region: Secondary | ICD-10-CM | POA: Diagnosis not present

## 2019-12-10 MED ORDER — METHYLPREDNISOLONE ACETATE 40 MG/ML IJ SUSP
40.0000 mg | Freq: Once | INTRAMUSCULAR | Status: AC
  Administered 2019-12-10: 40 mg via INTRAMUSCULAR

## 2019-12-10 MED ORDER — PREDNISONE 10 MG (21) PO TBPK: ORAL_TABLET | ORAL | 0 refills | Status: DC

## 2019-12-10 NOTE — Assessment & Plan Note (Signed)
Continue tizanidine and gabapentin as needed.  Given injection of depo-medrol 24m daily Start prednisone taper tomorrow.  Given handout of home stretches.  If not improving with conservative management we discussed that we may need to get updated MRI

## 2019-12-10 NOTE — Progress Notes (Signed)
Stephanie Ramsey - 69 y.o. female MRN [id number]  Date of birth: 11-14-1950  Subjective Chief Complaint  Patient presents with  . Back Pain  . Sciatica    HPI Stephanie Ramsey is a 69 y.o. female with history of DDD here today with complaint of low back pain and sciatica.  She initially had L sided sciatica that started about 3 weeks ago.  Developed low back pain last week.  Movement makes pain worse.  She denies numbness or tingling.  She has been using tizanidine and gabapentin with mild improvement.  She denies fever, chills, bowel/bladder dysfunction.   ROS:  A comprehensive ROS was completed and negative except as noted per HPI  Allergies  Allergen Reactions  . Oxycontin [Oxycodone] Swelling and Other (See Comments)    HYPOTENSION  . Penicillins Rash and Other (See Comments)    Unknown  Unknown   . Propofol     Past Medical History:  Diagnosis Date  . Arthritis   . Cancer ([place])    right breast  . Carpal tunnel syndrome   . Chronic back pain   . Complication of anesthesia    "stopped breathing during procedure and dizziness after procedures"  . Hypercholesterolemia   . Hypothyroidism   . Pneumonia   . Ulcerative colitis [facility])     Past Surgical History:  Procedure Laterality Date  . BILATERAL CARPAL TUNNEL RELEASE    . 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 BREAST LUMPECTOMY Right 2011   in situ   . BREAST SURGERY     biopsy and lumpectomy right breast  . CHOLECYSTECTOMY    . COLONOSCOPY W/ BIOPSIES AND POLYPECTOMY    . JOINT REPLACEMENT     Total right hip  . 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   Procedure: LEFT TOTAL HIP ARTHROPLASTY;  Surgeon: Garald Balding, MD;  Location: [facility];  Service: Orthopedics;  Laterality: Left;  . TRIGGER FINGER RELEASE      Social History   Socioeconomic History  . Marital status: Married    Spouse name: Not on file  . Number of children: Not on file  . Years of education: Not on file  . Highest education level: Not  on file  Occupational History  . Not on file  Tobacco Use  . Smoking status: Former Smoker    Types: Cigarettes  . Smokeless tobacco: Never Used  . Tobacco comment: quit smoking cigarettes 32 years ago  Vaping Use  . Vaping Use: Never used  Substance and Sexual Activity  . Alcohol use: Yes    Comment: social  . Drug use: No  . Sexual activity: Yes    Birth control/protection: None  Other Topics Concern  . Not on file  Social History Narrative  . Not on file   Social Determinants of Health   Financial Resource Strain:   . Difficulty of Paying Living Expenses: Not on file  Food Insecurity:   . Worried About Charity fundraiser in the Last Year: Not on file  . Ran Out of Food in the Last Year: Not on file  Transportation Needs:   . Lack of Transportation (Medical): Not on file  . Lack of Transportation (Non-Medical): Not on file  Physical Activity:   . Days of Exercise per Week: Not on file  . Minutes of Exercise per Session: Not on file  Stress:   . Feeling of Stress : Not on file  Social Connections:   .  Frequency of Communication with Friends and Family: Not on file  . Frequency of Social Gatherings with Friends and Family: Not on file  . Attends Religious Services: Not on file  . Active Member of Clubs or Organizations: Not on file  . Attends Archivist Meetings: Not on file  . Marital Status: Not on file    Family History  Problem Relation Age of Onset  . Breast cancer Mother   . Arthritis Other     Health Maintenance  Topic Date Due  . Hepatitis C Screening  Never done  . DEXA SCAN  Never done  . INFLUENZA VACCINE  09/16/2019  . PNA vac Low Risk Adult (2 of 2 - PPSV23) 04/08/2020 (Originally 01/03/2019)  . MAMMOGRAM  12/17/2020  . TETANUS/TDAP  05/04/2026  . COLONOSCOPY  01/16/2028  . COVID-19 Vaccine  Completed      ----------------------------------------------------------------------------------------------------------------------------------------------------------------------------------------------------------------- Physical Exam BP (!) 153/80 (BP Location: Left Arm, Patient Position: Sitting, Cuff Size: Normal)   Pulse 65   Wt 147 lb (66.7 kg)   SpO2 97%   BMI 26.05 kg/m   Physical Exam Constitutional:      Appearance: Normal appearance.  Eyes:     General: No scleral icterus. Pulmonary:     Effort: Pulmonary effort is normal.     Breath sounds: Normal breath sounds.  Musculoskeletal:        General: Tenderness (ttp along lower paraspinal muscles) present. No swelling.     Cervical back: Neck supple.  Neurological:     General: No focal deficit present.     Mental Status: She is alert.     Motor: No weakness.     Gait: Gait normal.  Psychiatric:        Mood and Affect: Mood normal.        Behavior: Behavior normal.     ------------------------------------------------------------------------------------------------------------------------------------------------------------------------------------------------------------------- Assessment and Plan  Lumbar radiculopathy Continue tizanidine and gabapentin as needed.  Given injection of depo-medrol 50m daily Start prednisone taper tomorrow.  Given handout of home stretches.  If not improving with conservative management we discussed that we may need to get updated MRI   Meds ordered this encounter  Medications  . predniSONE (STERAPRED UNI-PAK 21 TAB) 10 MG (21) TBPK tablet    Sig: Taper as directed on packaging    Dispense:  21 tablet    Refill:  0  . methylPREDNISolone acetate (DEPO-MEDROL) injection 40 mg    No follow-ups on file.    This visit occurred during the SARS-CoV-2 public health emergency.  Safety protocols were in place, including screening questions prior to the visit, additional usage of staff  PPE, and extensive cleaning of exam room while observing appropriate contact time as indicated for disinfecting solutions.

## 2019-12-10 NOTE — Patient Instructions (Signed)
Start prednisone tomorrow.  Continue duexis and tizanidine as needed.  Try home exercises.   Let me know if this is not improving over the next 10-14 days.

## 2020-01-03 ENCOUNTER — Encounter: Payer: Self-pay | Admitting: Family Medicine

## 2020-01-03 NOTE — Telephone Encounter (Signed)
Called patient, scheduled for tomorrow morning

## 2020-01-04 ENCOUNTER — Encounter: Payer: Self-pay | Admitting: Family Medicine

## 2020-01-04 ENCOUNTER — Other Ambulatory Visit: Payer: Self-pay

## 2020-01-04 ENCOUNTER — Ambulatory Visit (INDEPENDENT_AMBULATORY_CARE_PROVIDER_SITE_OTHER): Payer: Medicare Other | Admitting: Family Medicine

## 2020-01-04 DIAGNOSIS — F418 Other specified anxiety disorders: Secondary | ICD-10-CM

## 2020-01-04 MED ORDER — ESCITALOPRAM OXALATE 10 MG PO TABS: ORAL_TABLET | ORAL | 3 refills | Status: DC

## 2020-01-04 NOTE — Progress Notes (Signed)
Stephanie Ramsey - 69 y.o. female MRN [id number]  Date of birth: [birth year]  Subjective Chief Complaint  Patient presents with  . Depression    HPI Stephanie Ramsey is a 69 y.o. female here today to discuss changes in mood.  She has had dysphoric mood with feelings of sadness and anxiety.  She often internalizes these feelings which causes them to build further.  Since her retirement she feels like she hasn't found her new purpose. She finally decided it was time to come in for a visit when she heard Christmas music and it didn't provide the enjoyment that she normally gets from this.  She is tearful at times when she is on her on.   She does get frustrated a little more easily.    Depression screen [facility] 2/9 01/04/2020 07/18/2019 02/21/2019  Decreased Interest 2 - 0  Down, Depressed, Hopeless 2 1 0  PHQ - 2 Score 4 1 0  Altered sleeping 0 1 -  Tired, decreased energy 1 1 -  Change in appetite 0 0 -  Feeling bad or failure about yourself  0 0 -  Trouble concentrating 0 0 -  Moving slowly or fidgety/restless 0 0 -  Suicidal thoughts 0 0 -  PHQ-9 Score 5 3 -  Difficult doing work/chores Somewhat difficult Not difficult at all -   GAD 7 : Generalized Anxiety Score 01/04/2020 07/18/2019 08/16/2018  Nervous, Anxious, on Edge 2 2 0  Control/stop worrying 2 2 0  Worry too much - different things 2 2 1   Trouble relaxing 2 1 1   Restless 0 0 0  Easily annoyed or irritable 3 2 0  Afraid - awful might happen 0 0 1  Total GAD 7 Score 11 9 3   Anxiety Difficulty Somewhat difficult Not difficult at all Somewhat difficult     Allergies  Allergen Reactions  . Oxycontin [Oxycodone] Swelling and Other (See Comments)    HYPOTENSION  . Penicillins Rash and Other (See Comments)    Unknown  Unknown   . Propofol     Past Medical History:  Diagnosis Date  . Arthritis   . Cancer ([place])    right breast  . Carpal tunnel syndrome   . Chronic back pain   . Complication of anesthesia    "stopped breathing during  procedure and dizziness after procedures"  . Hypercholesterolemia   . Hypothyroidism   . Pneumonia   . Ulcerative colitis [facility])     Past Surgical History:  Procedure Laterality Date  . BILATERAL CARPAL TUNNEL RELEASE    . 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 BREAST LUMPECTOMY Right 2011   in situ   . BREAST SURGERY     biopsy and lumpectomy right breast  . CHOLECYSTECTOMY    . COLONOSCOPY W/ BIOPSIES AND POLYPECTOMY    . JOINT REPLACEMENT     Total right hip  . 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   Procedure: LEFT TOTAL HIP ARTHROPLASTY;  Surgeon: Garald Balding, MD;  Location: [facility];  Service: Orthopedics;  Laterality: Left;  . TRIGGER FINGER RELEASE      Social History   Socioeconomic History  . Marital status: Married    Spouse name: Not on file  . Number of children: Not on file  . Years of education: Not on file  . Highest education level: Not on file  Occupational History  . Not on file  Tobacco Use  . Smoking status: Former Smoker  Types: Cigarettes  . Smokeless tobacco: Never Used  . Tobacco comment: quit smoking cigarettes 32 years ago  Vaping Use  . Vaping Use: Never used  Substance and Sexual Activity  . Alcohol use: Yes    Comment: social  . Drug use: No  . Sexual activity: Yes    Birth control/protection: None  Other Topics Concern  . Not on file  Social History Narrative  . Not on file   Social Determinants of Health   Financial Resource Strain:   . Difficulty of Paying Living Expenses: Not on file  Food Insecurity:   . Worried About Charity fundraiser in the Last Year: Not on file  . Ran Out of Food in the Last Year: Not on file  Transportation Needs:   . Lack of Transportation (Medical): Not on file  . Lack of Transportation (Non-Medical): Not on file  Physical Activity:   . Days of Exercise per Week: Not on file  . Minutes of Exercise per Session: Not on file  Stress:   . Feeling of Stress : Not on file  Social  Connections:   . Frequency of Communication with Friends and Family: Not on file  . Frequency of Social Gatherings with Friends and Family: Not on file  . Attends Religious Services: Not on file  . Active Member of Clubs or Organizations: Not on file  . Attends Archivist Meetings: Not on file  . Marital Status: Not on file    Family History  Problem Relation Age of Onset  . Breast cancer Mother   . Arthritis Other     Health Maintenance  Topic Date Due  . Hepatitis C Screening  Never done  . DEXA SCAN  Never done  . PNA vac Low Risk Adult (2 of 2 - PPSV23) 04/08/2020 (Originally 01/03/2019)  . MAMMOGRAM  12/17/2020  . TETANUS/TDAP  05/04/2026  . COLONOSCOPY  01/16/2028  . INFLUENZA VACCINE  Completed  . COVID-19 Vaccine  Completed     ----------------------------------------------------------------------------------------------------------------------------------------------------------------------------------------------------------------- Physical Exam BP 114/63 (BP Location: Left Arm, Patient Position: Sitting, Cuff Size: Normal)   Pulse 77   Temp 97.7 F (36.5 C)   Ht 5' 2.6" (1.59 m)   Wt 143 lb 6.4 oz (65 kg)   SpO2 100%   BMI 25.73 kg/m   Physical Exam Constitutional:      Appearance: Normal appearance.  Neurological:     General: No focal deficit present.     Mental Status: She is alert.  Psychiatric:        Mood and Affect: Mood normal.        Behavior: Behavior normal.     ------------------------------------------------------------------------------------------------------------------------------------------------------------------------------------------------------------------- Assessment and Plan  Depression with anxiety Symptoms more bothersome, interested in trying prescription options at this point.  Discussed options and will start lexapro.  She will titrate from 5 to 26m after the first week.  Discussed adding  therapist/counseling as well.  She prefers to hold off on this for now.  I'll plan to see her again in about 1 month.    Meds ordered this encounter  Medications  . escitalopram (LEXAPRO) 10 MG tablet    Sig: Start 563mdaily x1 week then increase to 1033maily.    Dispense:  30 tablet    Refill:  3    Return in about 4 weeks (around 02/01/2020) for Mood.    This visit occurred during the SARS-CoV-2 public health emergency.  Safety protocols were in place, including screening questions  prior to the visit, additional usage of staff PPE, and extensive cleaning of exam room while observing appropriate contact time as indicated for disinfecting solutions.

## 2020-01-04 NOTE — Patient Instructions (Signed)
Let's try escitalopram (lexapro) Look into headspace or calm apps See me again in about 1 month

## 2020-01-04 NOTE — Assessment & Plan Note (Signed)
Symptoms more bothersome, interested in trying prescription options at this point.  Discussed options and will start lexapro.  She will titrate from 5 to 94m after the first week.  Discussed adding therapist/counseling as well.  She prefers to hold off on this for now.  I'll plan to see her again in about 1 month.

## 2020-01-17 ENCOUNTER — Ambulatory Visit: Payer: Medicare Other | Admitting: Family Medicine

## 2020-02-01 ENCOUNTER — Ambulatory Visit (INDEPENDENT_AMBULATORY_CARE_PROVIDER_SITE_OTHER): Payer: BC Managed Care – PPO | Admitting: Family Medicine

## 2020-02-01 ENCOUNTER — Other Ambulatory Visit: Payer: Self-pay

## 2020-02-01 ENCOUNTER — Encounter: Payer: Self-pay | Admitting: Family Medicine

## 2020-02-01 VITALS — BP 122/54 | HR 63 | Temp 97.8°F | Wt 147.3 lb

## 2020-02-01 DIAGNOSIS — E039 Hypothyroidism, unspecified: Secondary | ICD-10-CM | POA: Diagnosis not present

## 2020-02-01 DIAGNOSIS — D72819 Decreased white blood cell count, unspecified: Secondary | ICD-10-CM | POA: Diagnosis not present

## 2020-02-01 DIAGNOSIS — F418 Other specified anxiety disorders: Secondary | ICD-10-CM | POA: Diagnosis not present

## 2020-02-01 NOTE — Progress Notes (Signed)
Stephanie Ramsey - 69 y.o. female MRN [id number]  Date of birth: 1950-03-15  Subjective No chief complaint on file.   HPI Stephanie Ramsey is a 69 y.o. female here today for follow up of depression and anxiety.  She also needs updated labs for hypothyroidism.   She was started on lexapro at last visit.  Reports that she thinks she is seeing improvement with this.  Christmas is also her favorite holiday so its a little difficult to differentiate if its the medication or the holiday itself.  She is not having any side effects related to medication.    Depression screen [facility] 2/9 02/01/2020 01/04/2020 07/18/2019 02/21/2019 08/16/2018  Decreased Interest 0 2 - 0 0  Down, Depressed, Hopeless 0 2 1 0 0  PHQ - 2 Score 0 4 1 0 0  Altered sleeping 0 0 1 - -  Tired, decreased energy 1 1 1  - -  Change in appetite 1 0 0 - -  Feeling bad or failure about yourself  0 0 0 - -  Trouble concentrating 2 0 0 - -  Moving slowly or fidgety/restless 1 0 0 - -  Suicidal thoughts 0 0 0 - -  PHQ-9 Score 5 5 3  - -  Difficult doing work/chores Somewhat difficult Somewhat difficult Not difficult at all - -   GAD 7 : Generalized Anxiety Score 02/01/2020 01/04/2020 07/18/2019 08/16/2018  Nervous, Anxious, on Edge 1 2 2  0  Control/stop worrying 0 2 2 0  Worry too much - different things 2 2 2 1   Trouble relaxing 1 2 1 1   Restless 0 0 0 0  Easily annoyed or irritable 1 3 2  0  Afraid - awful might happen 0 0 0 1  Total GAD 7 Score 5 11 9 3   Anxiety Difficulty Somewhat difficult Somewhat difficult Not difficult at all Somewhat difficult   ROS:  A comprehensive ROS was completed and negative except as noted per HPI  Allergies  Allergen Reactions  . Oxycontin [Oxycodone] Swelling and Other (See Comments)    HYPOTENSION  . Penicillins Rash and Other (See Comments)    Unknown  Unknown   . Propofol     Past Medical History:  Diagnosis Date  . Arthritis   . Cancer ([place])    right breast  . Carpal tunnel syndrome   . Chronic  back pain   . Complication of anesthesia    "stopped breathing during procedure and dizziness after procedures"  . Hypercholesterolemia   . Hypothyroidism   . Pneumonia   . Ulcerative colitis [facility])     Past Surgical History:  Procedure Laterality Date  . BILATERAL CARPAL TUNNEL RELEASE    . 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 BREAST LUMPECTOMY Right 2011   in situ   . BREAST SURGERY     biopsy and lumpectomy right breast  . CHOLECYSTECTOMY    . COLONOSCOPY W/ BIOPSIES AND POLYPECTOMY    . JOINT REPLACEMENT     Total right hip  . 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   Procedure: LEFT TOTAL HIP ARTHROPLASTY;  Surgeon: Garald Balding, MD;  Location: [facility];  Service: Orthopedics;  Laterality: Left;  . TRIGGER FINGER RELEASE      Social History   Socioeconomic History  . Marital status: Married    Spouse name: Not on file  . Number of children: Not on file  . Years of education: Not on file  . Highest  education level: Not on file  Occupational History  . Not on file  Tobacco Use  . Smoking status: Former Smoker    Types: Cigarettes  . Smokeless tobacco: Never Used  . Tobacco comment: quit smoking cigarettes 32 years ago  Vaping Use  . Vaping Use: Never used  Substance and Sexual Activity  . Alcohol use: Yes    Comment: social  . Drug use: No  . Sexual activity: Yes    Birth control/protection: None  Other Topics Concern  . Not on file  Social History Narrative  . Not on file   Social Determinants of Health   Financial Resource Strain: Not on file  Food Insecurity: Not on file  Transportation Needs: Not on file  Physical Activity: Not on file  Stress: Not on file  Social Connections: Not on file    Family History  Problem Relation Age of Onset  . Breast cancer Mother   . Arthritis Other     Health Maintenance  Topic Date Due  . Hepatitis C Screening  Never done  . DEXA SCAN  Never done  . PNA vac Low Risk Adult (2 of 2 - PPSV23)  04/08/2020 (Originally 01/03/2019)  . COVID-19 Vaccine (4 - Booster for Moderna series) 06/30/2020  . MAMMOGRAM  12/17/2020  . TETANUS/TDAP  05/04/2026  . COLONOSCOPY  01/16/2028  . INFLUENZA VACCINE  Completed     ----------------------------------------------------------------------------------------------------------------------------------------------------------------------------------------------------------------- Physical Exam BP (!) 122/54 (BP Location: Left Arm, Patient Position: Sitting, Cuff Size: Normal)   Pulse 63   Temp 97.8 F (36.6 C)   Wt 147 lb 4.8 oz (66.8 kg)   SpO2 98%   BMI 26.43 kg/m   Physical Exam Constitutional:      Appearance: Normal appearance.  HENT:     Head: Normocephalic and atraumatic.  Eyes:     General: No scleral icterus. Cardiovascular:     Rate and Rhythm: Normal rate and regular rhythm.  Pulmonary:     Effort: Pulmonary effort is normal.     Breath sounds: Normal breath sounds.  Musculoskeletal:     Cervical back: Neck supple.  Neurological:     General: No focal deficit present.     Mental Status: She is alert.  Psychiatric:        Mood and Affect: Mood normal.     ------------------------------------------------------------------------------------------------------------------------------------------------------------------------------------------------------------------- Assessment and Plan  Depression with anxiety She is doing well with lexapro.  No significant side effects noted.  Continue at current strength.  She will check in with me again after the Holidays and New year.    Hypothyroidism Update TSH   No orders of the defined types were placed in this encounter.   No follow-ups on file.    This visit occurred during the SARS-CoV-2 public health emergency.  Safety protocols were in place, including screening questions prior to the visit, additional usage of staff PPE, and extensive cleaning of exam room  while observing appropriate contact time as indicated for disinfecting solutions.

## 2020-02-01 NOTE — Patient Instructions (Signed)
Great to see you today! Continue lexapro at current strength. Let me know around mid January how things are going Have labs completed.  Have a Merry Christmas and a Happy New Year!

## 2020-02-01 NOTE — Assessment & Plan Note (Signed)
She is doing well with lexapro.  No significant side effects noted.  Continue at current strength.  She will check in with me again after the Holidays and New year.

## 2020-02-01 NOTE — Assessment & Plan Note (Signed)
Update TSH

## 2020-02-03 LAB — CBC
HCT: 38.3 % (ref 35.0–45.0)
Hemoglobin: 12.5 g/dL (ref 11.7–15.5)
MCH: 32.4 pg (ref 27.0–33.0)
MCHC: 32.6 g/dL (ref 32.0–36.0)
MCV: 99.2 fL (ref 80.0–100.0)
MPV: 9.4 fL (ref 7.5–12.5)
Platelets: 242 10*3/uL (ref 140–400)
RBC: 3.86 10*6/uL (ref 3.80–5.10)
RDW: 12.3 % (ref 11.0–15.0)
WBC: 7.1 10*3/uL (ref 3.8–10.8)

## 2020-02-03 LAB — TSH: TSH: 8.66 mIU/L — ABNORMAL HIGH (ref 0.40–4.50)

## 2020-02-04 ENCOUNTER — Other Ambulatory Visit: Payer: Self-pay | Admitting: Family Medicine

## 2020-02-04 DIAGNOSIS — E039 Hypothyroidism, unspecified: Secondary | ICD-10-CM

## 2020-02-04 MED ORDER — LEVOTHYROXINE SODIUM 175 MCG PO TABS: 175.0000 ug | ORAL_TABLET | Freq: Every day | ORAL | 1 refills | Status: DC

## 2020-02-13 ENCOUNTER — Ambulatory Visit
Admission: RE | Admit: 2020-02-13 | Discharge: 2020-02-13 | Disposition: A | Discharge status: Home / Self Care | Payer: BC Managed Care – PPO | Source: Ambulatory Visit | Attending: Family Medicine | Admitting: Family Medicine

## 2020-02-13 ENCOUNTER — Other Ambulatory Visit: Payer: Self-pay

## 2020-02-13 DIAGNOSIS — Z1231 Encounter for screening mammogram for malignant neoplasm of breast: Secondary | ICD-10-CM

## 2020-04-07 ENCOUNTER — Encounter: Payer: Self-pay | Admitting: Family Medicine

## 2020-04-09 ENCOUNTER — Ambulatory Visit: Payer: Medicare Other | Admitting: Family Medicine

## 2020-04-09 ENCOUNTER — Ambulatory Visit (INDEPENDENT_AMBULATORY_CARE_PROVIDER_SITE_OTHER): Payer: Medicare Other | Admitting: Sports Medicine

## 2020-04-09 ENCOUNTER — Other Ambulatory Visit: Payer: Self-pay

## 2020-04-09 ENCOUNTER — Ambulatory Visit (INDEPENDENT_AMBULATORY_CARE_PROVIDER_SITE_OTHER): Payer: Medicare Other

## 2020-04-09 DIAGNOSIS — M5136 Other intervertebral disc degeneration, lumbar region: Secondary | ICD-10-CM

## 2020-04-09 DIAGNOSIS — M545 Low back pain, unspecified: Secondary | ICD-10-CM | POA: Diagnosis not present

## 2020-04-09 DIAGNOSIS — M51369 Other intervertebral disc degeneration, lumbar region without mention of lumbar back pain or lower extremity pain: Secondary | ICD-10-CM

## 2020-04-09 MED ORDER — PREDNISONE 50 MG PO TABS: ORAL_TABLET | ORAL | 0 refills | Status: DC

## 2020-04-09 NOTE — Progress Notes (Signed)
    Procedures performed today:    None.  Independent interpretation of notes and tests performed by another provider:   None.  Brief History, Exam, Impression, and Recommendations:    Lumbar degenerative disc disease This is a pleasant 70 year old female with a long history of low back pain, more recently she has an increase in back pain worse with sitting, flexion, Valsalva but nothing radicular, no red flag symptoms. We will start conservatively with prednisone, she has some Duexis at home that she will continue taking after she finishes 5 days of prednisone, she also has some Zanaflex she will take at night, x-rays, home rehab exercises, return to see me in 4 to 6 weeks, MRI for interventional planning if no better.    ___________________________________________ [name], M.D., ABFM., CAQSM. Primary Care and [place] Instructor of [place] [employer] [facility] of [employer]

## 2020-04-09 NOTE — Assessment & Plan Note (Signed)
This is a pleasant 70 year old female with a long history of low back pain, more recently she has an increase in back pain worse with sitting, flexion, Valsalva but nothing radicular, no red flag symptoms. We will start conservatively with prednisone, she has some Duexis at home that she will continue taking after she finishes 5 days of prednisone, she also has some Zanaflex she will take at night, x-rays, home rehab exercises, return to see me in 4 to 6 weeks, MRI for interventional planning if no better.

## 2020-05-02 ENCOUNTER — Other Ambulatory Visit: Payer: Self-pay

## 2020-05-02 MED ORDER — ESCITALOPRAM OXALATE 10 MG PO TABS: ORAL_TABLET | ORAL | 3 refills | Status: DC

## 2020-05-21 ENCOUNTER — Other Ambulatory Visit: Payer: Self-pay

## 2020-05-21 ENCOUNTER — Ambulatory Visit (INDEPENDENT_AMBULATORY_CARE_PROVIDER_SITE_OTHER): Payer: Medicare Other | Admitting: Sports Medicine

## 2020-05-21 DIAGNOSIS — M5136 Other intervertebral disc degeneration, lumbar region: Secondary | ICD-10-CM

## 2020-05-21 DIAGNOSIS — M51369 Other intervertebral disc degeneration, lumbar region without mention of lumbar back pain or lower extremity pain: Secondary | ICD-10-CM

## 2020-05-21 NOTE — Assessment & Plan Note (Signed)
Stephanie Ramsey returns, she is a pleasant 70 year old female, she also has a long history of low back pain, worse with sitting, flexion, Valsalva, just like her husband. We treated him conservatively with prednisone, she held off of the Duexis and then restarted after the prednisone, added Zanaflex, x-rays, home rehab exercises and she is nearly pain-free now. She can return to see me in an as-needed basis, when she runs out of Duexis we can certainly send it to the [facility] for [facility] order.

## 2020-05-21 NOTE — Progress Notes (Signed)
    Procedures performed today:    None.  Independent interpretation of notes and tests performed by another provider:   None.  Brief History, Exam, Impression, and Recommendations:    Lumbar degenerative disc disease [name] returns, she is a pleasant 70 year old female, she also has a long history of low back pain, worse with sitting, flexion, Valsalva, just like her husband. We treated him conservatively with prednisone, she held off of the Duexis and then restarted after the prednisone, added Zanaflex, x-rays, home rehab exercises and she is nearly pain-free now. She can return to see me in an as-needed basis, when she runs out of Duexis we can certainly send it to the [facility] for mail order.    ___________________________________________ [name], M.D., ABFM., CAQSM. Primary Care and [place] Instructor of [place] [employer] [facility] of Medicine

## 2020-06-20 ENCOUNTER — Ambulatory Visit (INDEPENDENT_AMBULATORY_CARE_PROVIDER_SITE_OTHER): Payer: Medicare Other | Admitting: Family Medicine

## 2020-06-20 DIAGNOSIS — Z780 Asymptomatic menopausal state: Secondary | ICD-10-CM

## 2020-06-20 DIAGNOSIS — Z0000 Encounter for general adult medical examination without abnormal findings: Secondary | ICD-10-CM | POA: Diagnosis not present

## 2020-06-20 NOTE — Progress Notes (Signed)
[place] VISIT  06/20/2020  Telephone Visit Disclaimer This Medicare AWV was conducted by telephone due to national recommendations for restrictions regarding the COVID-19 Pandemic (e.g. social distancing).  I verified, using two identifiers, that I am speaking with Stephanie Ramsey or their authorized healthcare agent. I discussed the limitations, risks, security, and privacy concerns of performing an evaluation and management service by telephone and the potential availability of an in-person appointment in the future. The patient expressed understanding and agreed to proceed.   Location of Patient: In [place], sister's home.  Location of Provider (nurse):  In the office.  Subjective:    Stephanie Ramsey is a 70 y.o. female patient of [name], DO who had a Medicare Annual Wellness Visit today via telephone. [place] is Retired and lives with their spouse. she has 2 children. she reports that she is socially active and does interact with friends/family regularly. she is moderately physically active and enjoys being outdoors.  Patient Care Team: [name], DO as PCP - General (Family Medicine) Hinda Lenis, MD as Referring Physician (Orthopedic Surgery) Richmond Campbell, MD as Consulting Physician (Gastroenterology)  Advanced Directives 06/20/2020 02/21/2019 05/15/2018 12/21/2013  Does Patient Have a Medical Advance Directive? Yes Yes No Yes  Type of Paramedic of [place];Living will [place];Living will - [place];Living will  Does patient want to make changes to medical advance directive? No - Patient declined No - Patient declined - No - Patient declined  Copy of [place] in Chart? No - copy requested Yes - validated most recent copy scanned in chart (See row information) - No - copy requested  Would patient like information on creating a medical advance directive? - - No - Patient  declined -    Hospital Utilization Over the Past 12 Months: # of hospitalizations or ER visits: 0 # of surgeries: 0  Review of Systems    Patient reports that her overall health is unchanged compared to last year.  History obtained from chart review and the patient  Patient Reported Readings (BP, Pulse, CBG, Weight, etc) none  Pain Assessment Pain : 0-10 Pain Type: Chronic pain Pain Location: Shoulder (Rotator cuff) Pain Orientation: Right Pain Descriptors / Indicators: Aching Pain Onset: More than a month ago Pain Frequency: Intermittent Pain Relieving Factors: Medication (Volatern gel)  Pain Relieving Factors: Medication (Volatern gel)  Current Medications & Allergies (verified) Allergies as of 06/20/2020      Reactions   Oxycontin [oxycodone] Swelling, Other (See Comments)   HYPOTENSION   Penicillins Rash, Other (See Comments)   Unknown  Unknown    Propofol       Medication List       Accurate as of Jun 20, 2020 11:54 AM. If you have any questions, ask your nurse or doctor.        acyclovir 400 MG tablet Commonly known as: ZOVIRAX Take by mouth. As needed.   atorvastatin 20 MG tablet Commonly known as: LIPITOR TAKE 1 TABLET(20 MG) BY MOUTH DAILY   Calcium + D3 600-200 MG-UNIT Tabs Take 1 tablet by mouth daily.   Cinnamon 500 MG capsule Take 500 mg by mouth daily.   diclofenac sodium 1 % Gel Commonly known as: VOLTAREN APP 2 TO 4 GRAMS AA BID PRN P.   escitalopram 10 MG tablet Commonly known as: Lexapro Start 68m daily x1 week then increase to 11mdaily.   Fish Oil 1360 MG Caps Take by  mouth.   folic acid 681 MCG tablet Commonly known as: FOLVITE Take 400 mcg by mouth daily.   gabapentin 300 MG capsule Commonly known as: NEURONTIN Take 1 capsule (300 mg total) by mouth 2 (two) times daily. What changed: when to take this   Ibuprofen-Famotidine 800-26.6 MG Tabs Take by mouth as needed.   levothyroxine 175 MCG tablet Commonly known as:  SYNTHROID Take 1 tablet (175 mcg total) by mouth daily.   Magnesium 500 MG Caps Take by mouth.   multivitamin with minerals tablet Take 1 tablet by mouth daily.   potassium gluconate 595 (99 K) MG Tabs tablet Take 595 mg by mouth.   predniSONE 50 MG tablet Commonly known as: DELTASONE One tab PO daily for 5 days.   sulfaSALAzine 500 MG EC tablet Commonly known as: AZULFIDINE Take 1,000 mg by mouth 2 (two) times daily.   tiZANidine 2 MG tablet Commonly known as: ZANAFLEX Take by mouth every 6 (six) hours as needed for muscle spasms.   triamcinolone cream 0.1 % Commonly known as: KENALOG Apply topically 2 (two) times daily as needed.   valACYclovir 500 MG tablet Commonly known as: VALTREX Take 500 mg by mouth 2 (two) times daily. As needed.       History (reviewed): Past Medical History:  Diagnosis Date  . Arthritis   . Cancer ([place])    right breast  . Carpal tunnel syndrome   . Chronic back pain   . Complication of anesthesia    "stopped breathing during procedure and dizziness after procedures"  . Hypercholesterolemia   . Hypothyroidism   . Pneumonia   . Ulcerative colitis [facility])    Past Surgical History:  Procedure Laterality Date  . BILATERAL CARPAL TUNNEL RELEASE    . 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 BREAST LUMPECTOMY Right 2011   in situ   . BREAST SURGERY     biopsy and lumpectomy right breast  . CHOLECYSTECTOMY    . COLONOSCOPY W/ BIOPSIES AND POLYPECTOMY    . JOINT REPLACEMENT     Total right hip  . 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   Procedure: LEFT TOTAL HIP ARTHROPLASTY;  Surgeon: Garald Balding, MD;  Location: [facility];  Service: Orthopedics;  Laterality: Left;  . TRIGGER FINGER RELEASE     Family History  Problem Relation Age of Onset  . Breast cancer Mother   . Arthritis Other    Social History   Socioeconomic History  . Marital status: Married    Spouse name: Harrell Gave  . Number of children: 2  . Years of  education: 31  . Highest education level: Master's degree (e.g., MA, MS, MEng, MEd, MSW, MBA)  Occupational History    Comment: Retired  Tobacco Use  . Smoking status: Former Smoker    Types: Cigarettes  . Smokeless tobacco: Never Used  . Tobacco comment: quit smoking cigarettes 32 years ago  Vaping Use  . Vaping Use: Never used  Substance and Sexual Activity  . Alcohol use: Yes    Alcohol/week: 3.0 standard drinks    Types: 3 Glasses of wine per week    Comment: social  . Drug use: No  . Sexual activity: Yes    Birth control/protection: None  Other Topics Concern  . Not on file  Social History Narrative   Lives with her husband. Currently visiting her sister for a funeral of her brother in law. Enjoys being outside.   Social Determinants of Health  Financial Resource Strain: Low Risk   . Difficulty of Paying Living Expenses: Not hard at all  Food Insecurity: No Food Insecurity  . Worried About Charity fundraiser in the Last Year: Never true  . Ran Out of Food in the Last Year: Never true  Transportation Needs: No Transportation Needs  . Lack of Transportation (Medical): No  . Lack of Transportation (Non-Medical): No  Physical Activity: Insufficiently Active  . Days of Exercise per Week: 4 days  . Minutes of Exercise per Session: 30 min  Stress: No Stress Concern Present  . Feeling of Stress : Not at all  Social Connections: Socially Integrated  . Frequency of Communication with Friends and Family: Twice a week  . Frequency of Social Gatherings with Friends and Family: Once a week  . Attends Religious Services: More than 4 times per year  . Active Member of Clubs or Organizations: Yes  . Attends Archivist Meetings: More than 4 times per year  . Marital Status: Married    Activities of Daily Living In your present state of health, do you have any difficulty performing the following activities: 06/20/2020  Hearing? N  Vision? N  Difficulty concentrating or  making decisions? Y  Comment Patient states she has trouble remembering something's here and there  Walking or climbing stairs? N  Dressing or bathing? N  Doing errands, shopping? N  Preparing Food and eating ? N  Using the Toilet? N  In the past six months, have you accidently leaked urine? Y  Comment She has noticed some leakage  Do you have problems with loss of bowel control? N  Managing your Medications? N  Managing your Finances? N  Housekeeping or managing your Housekeeping? N  Some recent data might be hidden    Patient Education/ Literacy How often do you need to have someone help you when you read instructions, pamphlets, or other written materials from your doctor or pharmacy?: 1 - Never What is the last grade level you completed in school?: Master's degree  Exercise Current Exercise Habits: Home exercise routine, Type of exercise: walking, Time (Minutes): 30, Frequency (Times/Week): 4, Weekly Exercise (Minutes/Week): 120, Intensity: Moderate, Exercise limited by: orthopedic condition(s)  Diet Patient reports consuming 3 meals a day and 3 snack(s) a day Patient reports that her primary diet is: Regular Patient reports that she does have regular access to food.   Depression Screen PHQ 2/9 Scores 06/20/2020 02/01/2020 01/04/2020 07/18/2019 02/21/2019 08/16/2018 03/31/2018  PHQ - 2 Score 0 0 4 1 0 0 0  PHQ- 9 Score - 5 5 3  - - -     Fall Risk Fall Risk  06/20/2020 02/01/2020 01/04/2020 07/18/2019 02/21/2019  Falls in the past year? 1 0 0 1 0  Number falls in past yr: 0 0 0 0 -  Injury with Fall? 1 0 0 1 -  Risk for fall due to : No Fall Risks No Fall Risks No Fall Risks - -  Follow up Education provided;Falls evaluation completed;Falls prevention discussed Falls evaluation completed Falls evaluation completed - Education provided;Falls prevention discussed     Objective:  Stephanie Ramsey seemed alert and oriented and she participated appropriately during our telephone visit.  Blood  Pressure Weight BMI  BP Readings from Last 3 Encounters:  02/01/20 (!) 122/54  01/04/20 114/63  12/10/19 (!) 153/80   Wt Readings from Last 3 Encounters:  02/01/20 147 lb 4.8 oz (66.8 kg)  01/04/20 143 lb 6.4 oz (65  kg)  12/10/19 147 lb (66.7 kg)   BMI Readings from Last 1 Encounters:  02/01/20 26.43 kg/m    *Unable to obtain current vital signs, weight, and BMI due to telephone visit type  Hearing/Vision  . [place] did not seem to have difficulty with hearing/understanding during the telephone conversation . Reports that she has had a formal eye exam by an eye care professional within the past year . Reports that she has not had a formal hearing evaluation within the past year *Unable to fully assess hearing and vision during telephone visit type  Cognitive Function: 6CIT Screen 06/20/2020  What Year? 0 points  What month? 0 points  What time? 0 points  Count back from 20 0 points  Months in reverse 0 points  Repeat phrase 0 points  Total Score 0   (Normal:0-7, Significant for Dysfunction: >8)  Normal Cognitive Function Screening: Yes   Immunization & Health Maintenance Record Immunization History  Administered Date(s) Administered  . Fluad Quad(high Dose 65+) 12/17/2018  . Influenza, High Dose Seasonal PF 01/04/2016, 01/02/2018, 11/08/2018, 12/04/2019  . Influenza-Unspecified 12/14/2004, 11/22/2011, 12/11/2012, 11/27/2013, 12/03/2016  . Moderna Sars-Covid-2 Vaccination 05/29/2019, 06/26/2019, 01/01/2020  . Pneumococcal Conjugate-13 08/13/2016  . Pneumococcal Polysaccharide-23 01/02/2014  . Rabies, IM 05/15/2018, 05/18/2018, 05/22/2018, 05/29/2018  . Tdap 12/16/2006, 05/03/2016  . Zoster 09/28/2010, 12/21/2016    Health Maintenance  Topic Date Due  . DEXA SCAN  06/20/2021 (Originally 12/12/2014)  . Hepatitis C Screening  06/20/2021 (Originally 12/12/1968)  . PNA vac Low Risk Adult (2 of 2 - PPSV23) 06/20/2021 (Originally 01/03/2019)  . COVID-19 Vaccine (4 - Booster  for Moderna series) 06/30/2020  . INFLUENZA VACCINE  09/15/2020  . MAMMOGRAM  02/12/2022  . TETANUS/TDAP  05/04/2026  . COLONOSCOPY (Pts 45-46yr Insurance coverage will need to be confirmed)  01/16/2028  . HPV VACCINES  Aged Out       Assessment  This is a routine wellness examination for Stephanie Ramsey  Health Maintenance: Due or Overdue There are no preventive care reminders to display for this patient.  JKathrene Aludoes not need a referral for Community Assistance: Care Management:   no Social Work:    no Prescription Assistance:  no Nutrition/Diabetes Education:  no   Plan:  Personalized Goals Goals Addressed              This Visit's Progress   .  Patient Stated (pt-stated)        06/20/2020 AWV Goal: Exercise for General Health   Patient will verbalize understanding of the benefits of increased physical activity:  Exercising regularly is important. It will improve your overall fitness, flexibility, and endurance.  Regular exercise also will improve your overall health. It can help you control your weight, reduce stress, and improve your bone density.  Over the next year, patient will increase physical activity as tolerated with a goal of at least 150 minutes of moderate physical activity per week.   You can tell that you are exercising at a moderate intensity if your heart starts beating faster and you start breathing faster but can still hold a conversation.  Moderate-intensity exercise ideas include:  Walking 1 mile (1.6 km) in about 15 minutes  Biking  Hiking  Golfing  Dancing  Water aerobics  Patient will verbalize understanding of everyday activities that increase physical activity by providing examples like the following: ? Yard work, such as: ? Pushing a lConservation officer, nature? Raking and bagging leaves ? Washing your car ?  Pushing a stroller ? Shoveling snow ? Gardening ? Washing windows or floors  Patient will be able to explain general safety  guidelines for exercising:   Before you start a new exercise program, talk with your health care provider.  Do not exercise so much that you hurt yourself, feel dizzy, or get very short of breath.  Wear comfortable clothes and wear shoes with good support.  Drink plenty of water while you exercise to prevent dehydration or heat stroke.  Work out until your breathing and your heartbeat get faster.       Personalized Health Maintenance & Screening Recommendations  Pneumococcal vaccine  Bone densitometry screening Shingrix vaccine  Lung Cancer Screening Recommended: no (Low Dose CT Chest recommended if Age 40-80 years, 30 pack-year currently smoking OR have quit w/in past 15 years) Hepatitis C Screening recommended: yes HIV Screening recommended: yes  Advanced Directives: Written information was not prepared per patient's request.  Referrals & Orders Orders Placed This Encounter  Procedures  . [place]    Follow-up Plan . Follow-up with [name], DO as planned . Pneumonia vaccine can be done at your next in-office visit . Bone density scan referral has been sent.  . Make your appointment at your pharmacy for Shingles vaccine. . Hep C screening can be done at your next in-office visit. . Medicare wellness in one year.    I have personally reviewed and noted the following in the patient's chart:   . Medical and social history . Use of alcohol, tobacco or illicit drugs  . Current medications and supplements . Functional ability and status . Nutritional status . Physical activity . Advanced directives . List of other physicians . Hospitalizations, surgeries, and ER visits in previous 12 months . Vitals . Screenings to include cognitive, depression, and falls . Referrals and appointments  In addition, I have reviewed and discussed with Stephanie Ramsey certain preventive protocols, quality metrics, and best practice recommendations. A written personalized care plan for  preventive services as well as general preventive health recommendations is available and can be mailed to the patient at her request.      Tinnie Gens, RN  06/20/2020

## 2020-06-20 NOTE — Patient Instructions (Addendum)
[place] Maintenance Summary and Written Plan of Care  Stephanie Ramsey ,  Thank you for allowing me to perform your Medicare Annual Wellness Visit and for your ongoing commitment to your health.   Health Maintenance & Immunization History Health Maintenance  Topic Date Due  . DEXA SCAN  06/20/2021 (Originally 12/12/2014)  . Hepatitis C Screening  06/20/2021 (Originally 12/12/1968)  . PNA vac Low Risk Adult (2 of 2 - PPSV23) 06/20/2021 (Originally 01/03/2019)  . COVID-19 Vaccine (4 - Booster for Moderna series) 06/30/2020  . INFLUENZA VACCINE  09/15/2020  . MAMMOGRAM  02/12/2022  . TETANUS/TDAP  05/04/2026  . COLONOSCOPY (Pts 45-12yr Insurance coverage will need to be confirmed)  01/16/2028  . HPV VACCINES  Aged Out   Immunization History  Administered Date(s) Administered  . Fluad Quad(high Dose 65+) 12/17/2018  . Influenza, High Dose Seasonal PF 01/04/2016, 01/02/2018, 11/08/2018, 12/04/2019  . Influenza-Unspecified 12/14/2004, 11/22/2011, 12/11/2012, 11/27/2013, 12/03/2016  . Moderna Sars-Covid-2 Vaccination 05/29/2019, 06/26/2019, 01/01/2020  . Pneumococcal Conjugate-13 08/13/2016  . Pneumococcal Polysaccharide-23 01/02/2014  . Rabies, IM 05/15/2018, 05/18/2018, 05/22/2018, 05/29/2018  . Tdap 12/16/2006, 05/03/2016  . Zoster 09/28/2010, 12/21/2016    These are the patient goals that we discussed: Goals Addressed              This Visit's Progress   .  Patient Stated (pt-stated)        06/20/2020 AWV Goal: Exercise for General Health   Patient will verbalize understanding of the benefits of increased physical activity:  Exercising regularly is important. It will improve your overall fitness, flexibility, and endurance.  Regular exercise also will improve your overall health. It can help you control your weight, reduce stress, and improve your bone density.  Over the next year, patient will increase physical activity as tolerated with a goal  of at least 150 minutes of moderate physical activity per week.   You can tell that you are exercising at a moderate intensity if your heart starts beating faster and you start breathing faster but can still hold a conversation.  Moderate-intensity exercise ideas include:  Walking 1 mile (1.6 km) in about 15 minutes  Biking  Hiking  Golfing  Dancing  Water aerobics  Patient will verbalize understanding of everyday activities that increase physical activity by providing examples like the following: ? Yard work, such as: ? Pushing a lConservation officer, nature? Raking and bagging leaves ? Washing your car ? Pushing a stroller ? Shoveling snow ? Gardening ? Washing windows or floors  Patient will be able to explain general safety guidelines for exercising:   Before you start a new exercise program, talk with your health care provider.  Do not exercise so much that you hurt yourself, feel dizzy, or get very short of breath.  Wear comfortable clothes and wear shoes with good support.  Drink plenty of water while you exercise to prevent dehydration or heat stroke.  Work out until your breathing and your heartbeat get faster.         This is a list of Health Maintenance Items that are overdue or due now: Pneumococcal vaccine  Bone densitometry screening Shingrix vaccine  Orders/Referrals Placed Today: No orders of the defined types were placed in this encounter.  (Contact our referral department at [phone] you have not spoken with someone about your referral appointment within the next 5 days)    Follow-up Plan . Follow-up with MLuetta Nutting DO as planned . Pneumonia vaccine can be  done at your next in-office visit . Bone density scan referral has been sent.  . Make your appointment at your pharmacy for Shingles vaccine. . Hep C screening can be done at your next in-office visit. . Medicare wellness in one year.

## 2020-08-14 ENCOUNTER — Other Ambulatory Visit: Payer: Self-pay | Admitting: Family Medicine

## 2020-08-26 LAB — HM COLONOSCOPY

## 2020-08-31 ENCOUNTER — Other Ambulatory Visit: Payer: Self-pay | Admitting: Family Medicine

## 2020-09-02 ENCOUNTER — Encounter: Payer: Self-pay | Admitting: Family Medicine

## 2020-09-04 ENCOUNTER — Encounter: Payer: Self-pay | Admitting: Family Medicine

## 2020-10-17 ENCOUNTER — Ambulatory Visit (INDEPENDENT_AMBULATORY_CARE_PROVIDER_SITE_OTHER): Payer: Medicare Other

## 2020-10-17 ENCOUNTER — Ambulatory Visit (INDEPENDENT_AMBULATORY_CARE_PROVIDER_SITE_OTHER): Payer: Medicare Other | Admitting: Family Medicine

## 2020-10-17 ENCOUNTER — Other Ambulatory Visit: Payer: Self-pay

## 2020-10-17 ENCOUNTER — Encounter: Payer: Self-pay | Admitting: Family Medicine

## 2020-10-17 VITALS — BP 141/64 | HR 55 | Temp 97.5°F | Ht 63.0 in | Wt 135.1 lb

## 2020-10-17 DIAGNOSIS — E039 Hypothyroidism, unspecified: Secondary | ICD-10-CM | POA: Diagnosis not present

## 2020-10-17 DIAGNOSIS — M89311 Hypertrophy of bone, right shoulder: Secondary | ICD-10-CM

## 2020-10-17 DIAGNOSIS — M2550 Pain in unspecified joint: Secondary | ICD-10-CM | POA: Diagnosis not present

## 2020-10-17 DIAGNOSIS — E785 Hyperlipidemia, unspecified: Secondary | ICD-10-CM | POA: Diagnosis not present

## 2020-10-17 DIAGNOSIS — K5180 Other ulcerative colitis without complications: Secondary | ICD-10-CM

## 2020-10-17 DIAGNOSIS — M62838 Other muscle spasm: Secondary | ICD-10-CM

## 2020-10-17 DIAGNOSIS — Q740 Other congenital malformations of upper limb(s), including shoulder girdle: Secondary | ICD-10-CM

## 2020-10-17 DIAGNOSIS — F419 Anxiety disorder, unspecified: Secondary | ICD-10-CM

## 2020-10-17 DIAGNOSIS — M5136 Other intervertebral disc degeneration, lumbar region: Secondary | ICD-10-CM

## 2020-10-17 MED ORDER — GABAPENTIN 300 MG PO CAPS
300.0000 mg | ORAL_CAPSULE | ORAL | 0 refills | Status: DC
Start: 2020-10-17 — End: 2020-11-13

## 2020-10-17 MED ORDER — TIZANIDINE HCL 2 MG PO TABS: 2.0000 mg | ORAL_TABLET | Freq: Four times a day (QID) | ORAL | 0 refills | Status: AC | PRN

## 2020-10-17 NOTE — Patient Instructions (Addendum)
Great to see you! We'll be in touch with lab and xray results.

## 2020-10-18 LAB — COMPLETE METABOLIC PANEL WITH GFR
AG Ratio: 2.1 (calc) (ref 1.0–2.5)
ALT: 25 U/L (ref 6–29)
AST: 29 U/L (ref 10–35)
Albumin: 4.6 g/dL (ref 3.6–5.1)
Alkaline phosphatase (APISO): 52 U/L (ref 37–153)
BUN: 15 mg/dL (ref 7–25)
CO2: 29 mmol/L (ref 20–32)
Calcium: 10 mg/dL (ref 8.6–10.4)
Chloride: 102 mmol/L (ref 98–110)
Creat: 0.73 mg/dL (ref 0.60–1.00)
Globulin: 2.2 g/dL (calc) (ref 1.9–3.7)
Glucose, Bld: 90 mg/dL (ref 65–99)
Potassium: 4 mmol/L (ref 3.5–5.3)
Sodium: 138 mmol/L (ref 135–146)
Total Bilirubin: 0.5 mg/dL (ref 0.2–1.2)
Total Protein: 6.8 g/dL (ref 6.1–8.1)
eGFR: 88 mL/min/{1.73_m2} (ref >=60)

## 2020-10-18 LAB — CBC WITH DIFFERENTIAL/PLATELET
Absolute Monocytes: 289 cells/uL (ref 200–950)
Basophils Absolute: 30 cells/uL (ref 0–200)
Basophils Relative: 0.8 %
Eosinophils Absolute: 19 cells/uL (ref 15–500)
Eosinophils Relative: 0.5 %
HCT: 43.6 % (ref 35.0–45.0)
Hemoglobin: 14.2 g/dL (ref 11.7–15.5)
Lymphs Abs: 1147 cells/uL (ref 850–3900)
MCH: 32.4 pg (ref 27.0–33.0)
MCHC: 32.6 g/dL (ref 32.0–36.0)
MCV: 99.5 fL (ref 80.0–100.0)
MPV: 9.5 fL (ref 7.5–12.5)
Monocytes Relative: 7.8 %
Neutro Abs: 2216 cells/uL (ref 1500–7800)
Neutrophils Relative %: 59.9 %
Platelets: 215 10*3/uL (ref 140–400)
RBC: 4.38 10*6/uL (ref 3.80–5.10)
RDW: 12.3 % (ref 11.0–15.0)
Total Lymphocyte: 31 %
WBC: 3.7 10*3/uL — ABNORMAL LOW (ref 3.8–10.8)

## 2020-10-18 LAB — LIPID PANEL W/REFLEX DIRECT LDL
Cholesterol: 201 mg/dL — ABNORMAL HIGH (ref <200)
HDL: 85 mg/dL (ref >=50)
LDL Cholesterol (Calc): 102 mg/dL (calc) — ABNORMAL HIGH
Non-HDL Cholesterol (Calc): 116 mg/dL (calc) (ref <130)
Total CHOL/HDL Ratio: 2.4 (calc) (ref <5.0)
Triglycerides: 59 mg/dL (ref <150)

## 2020-10-18 LAB — TSH: TSH: 0.2 mIU/L — ABNORMAL LOW (ref 0.40–4.50)

## 2020-10-20 ENCOUNTER — Other Ambulatory Visit: Payer: Self-pay | Admitting: Family Medicine

## 2020-10-20 ENCOUNTER — Encounter: Payer: Self-pay | Admitting: Family Medicine

## 2020-10-20 DIAGNOSIS — E039 Hypothyroidism, unspecified: Secondary | ICD-10-CM

## 2020-10-20 DIAGNOSIS — Q740 Other congenital malformations of upper limb(s), including shoulder girdle: Secondary | ICD-10-CM | POA: Insufficient documentation

## 2020-10-20 MED ORDER — LEVOTHYROXINE SODIUM 150 MCG PO TABS: 150.0000 ug | ORAL_TABLET | Freq: Every day | ORAL | 1 refills | Status: DC

## 2020-10-20 NOTE — Assessment & Plan Note (Signed)
Stable at this time, managed by gastroenterology.

## 2020-10-20 NOTE — Assessment & Plan Note (Signed)
To doing well with atorvastatin.  Update lipid panel today.

## 2020-10-20 NOTE — Progress Notes (Signed)
Stephanie Ramsey - 70 y.o. female MRN [id number]  Date of birth: 06-04-1950  Subjective No chief complaint on file.   HPI Stephanie Ramsey is a 71 year old female here today for follow-up visit.  She is concerned about right clavicular prominence as well.  Otherwise she reports she is doing fairly well.  She continues on atorvastatin for management of hyperlipidemia.  She is tolerating this well without any significant side effects at this time.  She reports she feels pretty good at current dose of levothyroxine.  She is taking daily first thing on an empty stomach.  She feels that Lexapro has been helpful for management of her anxiety.  She has not had any significant side effects with this.  She would like to continue this for now.  She continues to have some pain and radiculopathy related to spinal degenerative disc disease.  She is taking tizanidine as needed along with gabapentin daily.  This continues work fairly well for her.  She has not had any significant side effects noted with this.  Regards to clavicle prominence.  This is noted at the [facility] joint.  She denies any pain associated with this.  She does report having a fall about a year ago.  She was seen by orthopedics and told that she probably tore rotator cuff muscle however due to her good range of motion and preserved strength there was not recommend that she have this repaired.    ROS:  A comprehensive ROS was completed and negative except as noted per HPI  Allergies  Allergen Reactions   Oxycontin [Oxycodone] Swelling and Other (See Comments)    HYPOTENSION   Penicillins Rash and Other (See Comments)    Unknown  Unknown    Propofol     Past Medical History:  Diagnosis Date   Arthritis    Cancer ([place])    right breast   Carpal tunnel syndrome    Chronic back pain    Complication of anesthesia    "stopped breathing during procedure and dizziness after procedures"   Hypercholesterolemia    Hypothyroidism    Pneumonia     Ulcerative colitis ([place])     Past Surgical History:  Procedure Laterality Date   BILATERAL CARPAL TUNNEL RELEASE     BREAST BIOPSY Right 2011   In Situ DCIS   BREAST LUMPECTOMY Right 2011   in situ    BREAST SURGERY     biopsy and lumpectomy right breast   CHOLECYSTECTOMY     COLONOSCOPY W/ BIOPSIES AND POLYPECTOMY     JOINT REPLACEMENT     Total right hip   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   Procedure: LEFT TOTAL HIP ARTHROPLASTY;  Surgeon: Garald Balding, MD;  Location: [place];  Service: Orthopedics;  Laterality: Left;   TRIGGER FINGER RELEASE      Social History   Socioeconomic History   Marital status: Married    Spouse name: Harrell Gave   Number of children: 2   Years of education: 18   Highest education level: Master's degree (e.g., MA, MS, MEng, MEd, MSW, MBA)  Occupational History    Comment: Retired  Tobacco Use   Smoking status: Former    Types: Cigarettes   Smokeless tobacco: Never   Tobacco comments:    quit smoking cigarettes 32 years ago  Vaping Use   Vaping Use: Never used  Substance and Sexual Activity   Alcohol use: Yes    Alcohol/week: 3.0 standard drinks    Types: 3 Glasses  of wine per week    Comment: social   Drug use: No   Sexual activity: Yes    Birth control/protection: None  Other Topics Concern   Not on file  Social History Narrative   Lives with her husband. Currently visiting her sister for a funeral of her brother in law. Enjoys being outside.   Social Determinants of Health   Financial Resource Strain: Low Risk    Difficulty of Paying Living Expenses: Not hard at all  Food Insecurity: No Food Insecurity   Worried About Charity fundraiser in the Last Year: Never true   [place] in the Last Year: Never true  Transportation Needs: No Transportation Needs   Lack of Transportation (Medical): No   Lack of Transportation (Non-Medical): No  Physical Activity: Insufficiently Active   Days of Exercise per Week: 4 days    Minutes of Exercise per Session: 30 min  Stress: No Stress Concern Present   Feeling of Stress : Not at all  Social Connections: Socially Integrated   Frequency of Communication with Friends and Family: Twice a week   Frequency of Social Gatherings with Friends and Family: Once a week   Attends Religious Services: More than 4 times per year   Active Member of Clubs or Organizations: Yes   Attends Music therapist: More than 4 times per year   Marital Status: Married    Family History  Problem Relation Age of Onset   Breast cancer Mother    Arthritis Other     Health Maintenance  Topic Date Due   Zoster Vaccines- Shingrix (1 of 2) Never done   COVID-19 Vaccine (4 - Booster for Moderna series) 04/02/2020   INFLUENZA VACCINE  09/15/2020   DEXA SCAN  06/20/2021 (Originally 12/12/2014)   Hepatitis C Screening  06/20/2021 (Originally 12/12/1967)   PNA vac Low Risk Adult (2 of 2 - PPSV23) 06/20/2021 (Originally 01/03/2019)   MAMMOGRAM  02/12/2022   COLONOSCOPY (Pts 45-31yr Insurance coverage will need to be confirmed)  08/27/2023   TETANUS/TDAP  05/04/2026   HPV VACCINES  Aged Out     ----------------------------------------------------------------------------------------------------------------------------------------------------------------------------------------------------------------- Physical Exam BP (!) 141/64 (BP Location: Left Arm, Patient Position: Sitting, Cuff Size: Normal)   Pulse (!) 55   Temp (!) 97.5 F (36.4 C)   Ht 5' 3"  (1.6 m)   Wt 135 lb 1.6 oz (61.3 kg)   SpO2 99%   BMI 23.93 kg/m   Physical Exam Constitutional:      Appearance: Normal appearance.  HENT:     Head: Normocephalic and atraumatic.  Eyes:     General: No scleral icterus. Cardiovascular:     Rate and Rhythm: Normal rate and regular rhythm.  Pulmonary:     Effort: Pulmonary effort is normal.     Breath sounds: Normal breath sounds.  Musculoskeletal:     Cervical  back: Neck supple.  Neurological:     General: No focal deficit present.     Mental Status: She is alert.  Psychiatric:        Mood and Affect: Mood normal.        Behavior: Behavior normal.    ------------------------------------------------------------------------------------------------------------------------------------------------------------------------------------------------------------------- Assessment and Plan  Ulcerative colitis [place] Stable at this time, managed by gastroenterology.  Hypothyroidism Updating TSH today.  Lumbar degenerative disc disease Using Zanaflex as needed.  Prescription for gabapentin renewed as well.  Anxiety This is improved with Lexapro.  We will continue at current strength.  Hyperlipidemia To doing  well with atorvastatin.  Update lipid panel today.  Abnormal prominence of clavicle Mild prominence at the [facility] joint, possibly related to arthritic change.  This may be due to anatomical changes related to previous rotator cuff muscle tear.  X-[place]ak[place] ordered.   Meds ordered this encounter  Medications   gabapentin (NEURONTIN) 300 MG capsule    Sig: Take 1 capsule (300 mg total) by mouth every other day.    Dispense:  45 capsule    Refill:  0   tiZANidine (ZANAFLEX) 2 MG tablet    Sig: Take 1 tablet (2 mg total) by mouth every 6 (six) hours as needed for muscle spasms.    Dispense:  90 tablet    Refill:  0    No follow-ups on file.    This visit occurred during the SARS-CoV-2 public health emergency.  Safety protocols were in place, including screening questions prior to the visit, additional usage of staff PPE, and extensive cleaning of exam room while observing appropriate contact time as indicated for disinfecting solutions.

## 2020-10-20 NOTE — Assessment & Plan Note (Signed)
Using Zanaflex as needed.  Prescription for gabapentin renewed as well.

## 2020-10-20 NOTE — Assessment & Plan Note (Signed)
Mild prominence at the [facility] joint, possibly related to arthritic change.  This may be due to anatomical changes related to previous rotator cuff muscle tear.  X-[place] ordered.

## 2020-10-20 NOTE — Assessment & Plan Note (Signed)
This is improved with Lexapro.  We will continue at current strength.

## 2020-10-20 NOTE — Assessment & Plan Note (Signed)
Updating TSH today.

## 2020-11-13 ENCOUNTER — Encounter: Payer: Self-pay | Admitting: Family Medicine

## 2020-11-13 DIAGNOSIS — M2550 Pain in unspecified joint: Secondary | ICD-10-CM

## 2020-11-13 DIAGNOSIS — M62838 Other muscle spasm: Secondary | ICD-10-CM

## 2020-11-13 MED ORDER — GABAPENTIN 300 MG PO CAPS: 300.0000 mg | ORAL_CAPSULE | ORAL | 3 refills | Status: DC

## 2020-11-13 NOTE — Addendum Note (Signed)
Addended by: Fonnie Mu on: 11/13/2020 04:57 PM   Modules accepted: Orders

## 2020-11-13 NOTE — Telephone Encounter (Signed)
Did you mean to refuse the levothyroxine?  The reason for the request was that the patient needed it sent to a different pharmacy.  Wishing you well, Kenney Houseman, CMA ([place]) [place]

## 2020-11-14 ENCOUNTER — Other Ambulatory Visit: Payer: Self-pay | Admitting: Family Medicine

## 2020-11-14 MED ORDER — LEVOTHYROXINE SODIUM 150 MCG PO TABS: 150.0000 ug | ORAL_TABLET | Freq: Every day | ORAL | 1 refills | Status: DC

## 2020-11-24 ENCOUNTER — Telehealth: Payer: Self-pay

## 2020-11-24 DIAGNOSIS — H8111 Benign paroxysmal vertigo, right ear: Secondary | ICD-10-CM

## 2020-11-24 NOTE — Telephone Encounter (Signed)
Pt called to have PT reordered for BPPV (benign paroxysmal positional vertigo), right.  She would like to have PT at the same location as before.

## 2020-11-24 NOTE — Telephone Encounter (Signed)
Orders entered

## 2020-12-03 ENCOUNTER — Encounter: Payer: Self-pay | Admitting: Family Medicine

## 2020-12-03 ENCOUNTER — Ambulatory Visit (INDEPENDENT_AMBULATORY_CARE_PROVIDER_SITE_OTHER): Payer: Medicare Other | Admitting: Family Medicine

## 2020-12-03 VITALS — BP 151/79 | HR 77 | Temp 97.8°F | Ht 63.0 in | Wt 145.2 lb

## 2020-12-03 DIAGNOSIS — M659 Synovitis and tenosynovitis, unspecified: Secondary | ICD-10-CM

## 2020-12-03 MED ORDER — PREDNISONE 50 MG PO TABS: ORAL_TABLET | ORAL | 0 refills | Status: DC

## 2020-12-03 NOTE — Assessment & Plan Note (Addendum)
She was fitted for a wrist splint today.  Instructed to wear this for the several days.  I recommend that she ice this area regularly over the next few days.  Adding prednisone 50 mg daily.  She will let me know if not improving or if symptoms are worsening.  We can consider checking uric acid if this is a recurrent issue.

## 2020-12-03 NOTE — Progress Notes (Signed)
Stephanie Ramsey - 70 y.o. female MRN [id number]  Date of birth: 1950-11-19  Subjective Chief Complaint  Patient presents with   Wrist Injury    HPI Stephanie Ramsey is a 70 year old female here today with complaint of right wrist pain.  She reports that this started a few days ago.  She denies any injury.  She did make a large box of chicken salad over the weekend which required a lot of stirring but this has not bothered her in the past.  There is associated swelling with some warmth.  She denies numbness or tingling.  She has tried ibuprofen with minimal relief.  ROS:  A comprehensive ROS was completed and negative except as noted per HPI  Allergies  Allergen Reactions   Oxycontin [Oxycodone] Swelling and Other (See Comments)    HYPOTENSION   Penicillins Rash and Other (See Comments)    Unknown  Unknown    Propofol     Past Medical History:  Diagnosis Date   Arthritis    Cancer ([place])    right breast   Carpal tunnel syndrome    Chronic back pain    Complication of anesthesia    "stopped breathing during procedure and dizziness after procedures"   Hypercholesterolemia    Hypothyroidism    Pneumonia    Ulcerative colitis ([place])     Past Surgical History:  Procedure Laterality Date   BILATERAL CARPAL TUNNEL RELEASE     BREAST BIOPSY Right 2011   In Situ DCIS   BREAST LUMPECTOMY Right 2011   in situ    BREAST SURGERY     biopsy and lumpectomy right breast   CHOLECYSTECTOMY     COLONOSCOPY W/ BIOPSIES AND POLYPECTOMY     JOINT REPLACEMENT     Total right hip   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   Procedure: LEFT TOTAL HIP ARTHROPLASTY;  Surgeon: Garald Balding, MD;  Location: [place];  Service: Orthopedics;  Laterality: Left;   TRIGGER FINGER RELEASE      Social History   Socioeconomic History   Marital status: Married    Spouse name: Harrell Gave   Number of children: 2   Years of education: 18   Highest education level: Master's degree (e.g., MA, MS, MEng, MEd, MSW,  MBA)  Occupational History    Comment: Retired  Tobacco Use   Smoking status: Former    Types: Cigarettes   Smokeless tobacco: Never   Tobacco comments:    quit smoking cigarettes 32 years ago  Vaping Use   Vaping Use: Never used  Substance and Sexual Activity   Alcohol use: Yes    Alcohol/week: 3.0 standard drinks    Types: 3 Glasses of wine per week    Comment: social   Drug use: No   Sexual activity: Yes    Birth control/protection: None  Other Topics Concern   Not on file  Social History Narrative   Lives with her husband. Currently visiting her sister for a funeral of her brother in law. Enjoys being outside.   Social Determinants of Health   Financial Resource Strain: Low Risk    Difficulty of Paying Living Expenses: Not hard at all  Food Insecurity: No Food Insecurity   Worried About Charity fundraiser in the Last Year: Never true   [place] in the Last Year: Never true  Transportation Needs: No Transportation Needs   Lack of Transportation (Medical): No   Lack of Transportation (Non-Medical): No  Physical Activity:  Insufficiently Active   Days of Exercise per Week: 4 days   Minutes of Exercise per Session: 30 min  Stress: No Stress Concern Present   Feeling of Stress : Not at all  Social Connections: Socially Integrated   Frequency of Communication with Friends and Family: Twice a week   Frequency of Social Gatherings with Friends and Family: Once a week   Attends Religious Services: More than 4 times per year   Active Member of Clubs or Organizations: Yes   Attends Music therapist: More than 4 times per year   Marital Status: Married    Family History  Problem Relation Age of Onset   Breast cancer Mother    Arthritis Other     Health Maintenance  Topic Date Due   Zoster Vaccines- Shingrix (1 of 2) Never done   Pneumonia Vaccine 25+ Years old (3 - PPSV23 or PCV20) 01/03/2019   COVID-19 Vaccine (4 - Booster for Moderna series)  03/25/2020   INFLUENZA VACCINE  09/15/2020   DEXA SCAN  06/20/2021 (Originally 12/12/2014)   Hepatitis C Screening  06/20/2021 (Originally 12/12/1967)   MAMMOGRAM  02/12/2022   COLONOSCOPY (Pts 45-20yr Insurance coverage will need to be confirmed)  08/27/2023   TETANUS/TDAP  05/04/2026   HPV VACCINES  Aged Out     ----------------------------------------------------------------------------------------------------------------------------------------------------------------------------------------------------------------- Physical Exam BP (!) 151/79 (BP Location: Left Arm, Patient Position: Sitting, Cuff Size: Normal)   Pulse 77   Temp 97.8 F (36.6 C)   Ht 5' 3"  (1.6 m)   Wt 145 lb 3.2 oz (65.9 kg)   SpO2 99%   BMI 25.72 kg/m   Physical Exam Constitutional:      Appearance: Normal appearance.  Musculoskeletal:     Comments: There is swelling and tenderness of the right wrist that extends into the hand.  There is also some warmth along the wrist.  She has pain with movement of the wrist in all planes.  No significant pain in the finger joints.  Neurological:     Mental Status: She is alert.    ------------------------------------------------------------------------------------------------------------------------------------------------------------------------------------------------------------------- Assessment and Plan  Tenosynovitis of right wrist She was fitted for a wrist splint today.  Instructed to wear this for the several days.  I recommend that she ice this area regularly over the next few days.  Adding prednisone 50 mg daily.  She will let me know if not improving or if symptoms are worsening.  We can consider checking uric acid if this is a recurrent issue.   Meds ordered this encounter  Medications   predniSONE (DELTASONE) 50 MG tablet    Sig: One tab PO daily for 5 days.    Dispense:  5 tablet    Refill:  0    No follow-ups on file.    This visit  occurred during the SARS-CoV-2 public health emergency.  Safety protocols were in place, including screening questions prior to the visit, additional usage of staff PPE, and extensive cleaning of exam room while observing appropriate contact time as indicated for disinfecting solutions.

## 2020-12-03 NOTE — Patient Instructions (Signed)
Tenosynovitis Tenosynovitis is inflammation of a tendon and of the sleeve of tissue that covers the tendon (tendon sheath). A tendon is a cord of tissue that connects muscle to bone. Normally, a tendon slides smoothly inside its tendon sheath. Tenosynovitis limits movement of the tendon and surrounding tissues, which may cause pain and stiffness. Tenosynovitis can affect any tendon and tendon sheath. Commonly affected areas include tendons in the: Wrist. Arm. Hand. Hip. Leg. Foot. Shoulder. What are the causes? The main cause of this condition is wear and tear over time that results in slight tears in the tendon. Other possible causes include: An injury to the tendon or tendon sheath. A disease that causes inflammation in the body. An infection that spreads to the tendon and tendon sheath from a skin wound. An infection in another part of the body that spreads to the tendon and tendon sheath through the blood. What increases the risk? The following factors may make you more likely to develop this condition: Having rheumatoid arthritis, gout, or diabetes. Using IV drugs. Doing physical activities that can cause tendon overuse and stress. Having gonorrhea. What are the signs or symptoms? Symptoms of this condition depend on the cause. Symptoms may include: Pain with movement. Pain when pressing on the tendon and tendon sheath. Swelling. Stiffness. If tenosynovitis is caused by an infection, symptoms may include: Fever. Redness. Warmth. How is this diagnosed? This condition may be diagnosed based on your medical history and a physical exam. You also may have: Blood tests. Imaging tests, such as: MRI. Ultrasound. A sample of fluid removed from inside the tendon sheath to be checked in a lab. How is this treated? Treatment for this condition depends on the cause. If tenosynovitis is not caused by an infection, treatment may include: Rest. Keeping the tendon in place  (immobilization) in a splint, brace, or sling. Taking NSAIDs to reduce pain and swelling. A shot (injection) of medicine to help reduce pain and swelling (steroid). Icing or applying heat to the affected area. Physical therapy. Surgery to release the tendon in the sheath or to repair damage to the tendon or tendon sheath. Surgery may be done if other treatments do not help relieve symptoms. If tenosynovitis is caused by infection, treatment may include antibiotic medicine given through an IV. In some cases, surgery may be needed to drain fluid from the tendon sheath or to remove the tendon sheath. Follow these instructions at home: If you have a splint, brace, or sling:  Wear the splint, brace, or sling as told by your health care provider. Remove it only as told by your health care provider. Loosen the splint, brace, or sling if your fingers or toes tingle, become numb, or turn cold and blue. Keep the splint, brace, or sling clean. If the splint, brace, or sling is not waterproof: Do not let it get wet. Cover it with a watertight covering when you take a bath or shower. Managing pain, stiffness, and swelling  If directed, put ice on the affected area. Put ice in a plastic bag. Place a towel between your skin and the bag. Leave the ice on for 20 minutes, 2-3 times a day. Move the fingers or toes of the affected limb often, if this applies. This can help to reduce stiffness and swelling. If directed, raise (elevate) the affected area above the level of your heart while you are sitting or lying down. If directed, apply heat to the affected area before you exercise. Use the heat source that  your health care provider recommends, such as a moist heat pack or a heating pad. Place a towel between your skin and the heat source. Leave the heat on for 20-30 minutes. Remove the heat if your skin turns bright red. This is especially important if you are unable to feel pain, heat, or cold. You may have  a greater risk of getting burned. Medicines Take over-the-counter and prescription medicines only as told by your health care provider. Ask your health care provider if the medicine prescribed to you: Requires you to avoid driving or using heavy machinery. Can cause constipation. You may need to take actions to prevent or treat constipation, such as: Drink enough fluid to keep your urine pale yellow. Take over-the-counter or prescription medicines. Eat foods that are high in fiber, such as beans, whole grains, and fresh fruits and vegetables. Limit foods that are high in fat and processed sugars, such as fried or sweet foods. Activity Return to your normal activities as told by your health care provider. Ask your health care provider what activities are safe for you. Rest the affected area as told by your health care provider. Avoid using the affected area while you are having symptoms. Do not use the injured limb to support your body weight until your health care provider says that you can. If physical therapy was prescribed, do exercises as told by your health care provider. General instructions Ask your health care provider when it is safe to drive if you have a splint or brace on any part of your arm or leg. Keep all follow-up visits as told by your health care provider. This is important. Contact a health care provider if: Your symptoms are not improving or are getting worse. Get help right away if: Your fingers or toes become numb or turn blue. You have a fever and more of any of the following symptoms: Pain. Redness. Warmth. Swelling. Summary Tenosynovitis is inflammation of a tendon and of the sleeve of tissue that covers the tendon (tendon sheath). Treatment for this condition depends on the cause. Treatment may include rest, medicines, physical therapy, or surgery. Contact a health care provider if your symptoms are not improving or are getting worse. Keep all follow-up  visits as told by your health care provider. This is important. This information is not intended to replace advice given to you by your health care provider. Make sure you discuss any questions you have with your health care provider. Document Revised: 09/22/2017 Document Reviewed: 09/22/2017 Elsevier Patient Education  [place].

## 2020-12-04 ENCOUNTER — Ambulatory Visit (INDEPENDENT_AMBULATORY_CARE_PROVIDER_SITE_OTHER): Payer: Medicare Other | Admitting: Physical Therapy

## 2020-12-04 ENCOUNTER — Other Ambulatory Visit: Payer: Self-pay

## 2020-12-04 DIAGNOSIS — H8111 Benign paroxysmal vertigo, right ear: Secondary | ICD-10-CM | POA: Diagnosis present

## 2020-12-04 NOTE — Therapy (Addendum)
[place], [place], [address] Phone: [phone]   Fax:  [phone]  Physical Therapy Evaluation and Discharge  Patient Details  Name: Stephanie Ramsey MRN: [id number] Date of Birth: 25-Aug-1950 No data recorded  PHYSICAL THERAPY DISCHARGE SUMMARY  Visits from Start of Care: 1  Current functional level related to goals / functional outcomes: No call back since initial eval for any issues or recurrence. Will officially d/c.   Remaining deficits: See below   Education / Equipment: See below   Patient agrees to discharge. Patient goals were  deferred . Patient is being discharged due to not returning since the last visit.   Encounter Date: 12/04/2020   PT End of Session - 12/04/20 0855     Visit Number 1    Number of Visits 2    PT Start Time 0850    PT Stop Time 0930    PT Time Calculation (min) 40 min    Activity Tolerance Patient tolerated treatment well    Behavior During Therapy WFL for tasks assessed/performed             Past Medical History:  Diagnosis Date   Arthritis    Cancer ([place])    right breast   Carpal tunnel syndrome    Chronic back pain    Complication of anesthesia    "stopped breathing during procedure and dizziness after procedures"   Hypercholesterolemia    Hypothyroidism    Pneumonia    Ulcerative colitis ([place])     Past Surgical History:  Procedure Laterality Date   BILATERAL CARPAL TUNNEL RELEASE     BREAST BIOPSY Right 2011   In Situ DCIS   BREAST LUMPECTOMY Right 2011   in situ    BREAST SURGERY     biopsy and lumpectomy right breast   CHOLECYSTECTOMY     COLONOSCOPY W/ BIOPSIES AND POLYPECTOMY     JOINT REPLACEMENT     Total right hip   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   Procedure: LEFT TOTAL HIP ARTHROPLASTY;  Surgeon: Garald Balding, MD;  Location: [facility];  Service: Orthopedics;  Laterality: Left;   TRIGGER FINGER RELEASE       There were no vitals filed for this visit.    Subjective Assessment - 12/04/20 0853     Subjective Pt reports that on Sept 27, 2022 she was on vacation in [place] and got COVID (lasted ~ a week). She then got the flu. Pt then reports increased R arm swelling (placed on prednisone and wrist brace) with increased tenderness and pain. Sometime during that time when she lays down she would feel dizziness, turning her head to the right or getting up. This has subsided some. Now feels a little lightheaded. Still does not get up right away.                        Vestibular Assessment - 12/04/20 0001       Symptom Behavior   Subjective history of current problem Started ~ 3 weeks ago    Type of Dizziness  Spinning    Frequency of Dizziness daily    Duration of Dizziness seconds    Symptom Nature Motion provoked;Positional    Aggravating Factors Turning head quickly;Supine to sit    Relieving Factors Head stationary    Progression of Symptoms Better    History of similar episodes Has had something similar in the past with  crystals and saw Christina. Only needed 1 PT.      Oculomotor Exam   Oculomotor Alignment Normal    Ocular ROM WFL    Spontaneous Absent    Gaze-induced  Right beating nystagmus with L gaze    Head shaking Horizontal Absent    Head Shaking Vertical Absent    Smooth Pursuits Intact    Saccades Intact      Vestibulo-Ocular Reflex   VOR 1 Head Only (x 1 viewing) VOR x 1 at self regulated pace= no dizziness and able to maintain gaze stabilization    VOR to Slow Head Movement Normal    VOR Cancellation Normal      Positional Testing   Dix-Hallpike Dix-Hallpike Right;Dix-Hallpike Left    Horizontal Canal Testing Horizontal Canal Right;Horizontal Canal Left      Dix-Hallpike Right   Dix-Hallpike Right Duration 0    Dix-Hallpike Right Symptoms --   mild lightheadedness with sup to sit     Dix-Hallpike Left   Dix-Hallpike Left Duration 0     Dix-Hallpike Left Symptoms --   mild lightheadedness with sup to sit     Horizontal Canal Right   Horizontal Canal Right Duration none      Horizontal Canal Left   Horizontal Canal Left Duration none                Objective measurements completed on examination: See above findings.                PT Education - 12/04/20 0927     Education Details Discussed BPPV, diagnosis, home maneuvers for treatment.    Person(s) Educated Patient    Methods Explanation;Demonstration;Verbal cues;Handout;Tactile cues    Comprehension Verbalized understanding;Returned demonstration;Verbal cues required              PT Short Term Goals - 12/04/20 0927       PT SHORT TERM GOAL #1   Title Pt will be independent with home maneuvers to treat BPPV    Time 2    Period Weeks    Status New    Target Date 12/18/20      PT SHORT TERM GOAL #2   Title Pt will have no symptoms in all positions of canal testing to demo resolution of BPPV    Time 2    Period Weeks    Status New    Target Date 12/18/20                       Plan - 12/04/20 1219     Clinical Impression Statement Stephanie Ramsey is an active 70 y/o F presenting to OPPT due to complaints of dizziness. Pt notes that this has been improving and is now more lightheadedness. Per pt report, s/s appear consistent with resolving R posterior canal BPPV. On vestibular assessment, pt with no gross deficits noted. Some continued mild dizziness with performing Epley maneuver. Provided pt information on BPPV and how to do home Epley maneuver. Pt able to demonstrate with good understanding. Pt to try at home and call clinic if any questions or concerns if there has been no improvements within the next week.    Personal Factors and Comorbidities Age;Time since onset of injury/illness/exacerbation    Examination-Activity Limitations Bed Mobility;Transfers    Stability/Clinical Decision Making Stable/Uncomplicated     Clinical Decision Making Low    Rehab Potential Good    PT Frequency Other (comment)   1-2 visits  PT Treatment/Interventions Neuromuscular re-education;Vestibular;Gait training;Stair training;Functional mobility training;Therapeutic activities;Therapeutic exercise;Balance training;ADLs/Self Care Home Management    PT Next Visit Plan If pt calls within next week: Review Epley maneuver. Provide semont maneuver if indicated. Recheck canals. If pt does not follow up will d/c officially.    PT Home Exercise Plan Home Epley Maneuver             Patient will benefit from skilled therapeutic intervention in order to improve the following deficits and impairments:  Dizziness  Visit Diagnosis: BPPV (benign paroxysmal positional vertigo), right     Problem List Patient Active Problem List   Diagnosis Date Noted   Tenosynovitis of right wrist 12/03/2020   Abnormal prominence of clavicle 10/20/2020   Lumbar degenerative disc disease 12/10/2019   Benign paroxysmal positional vertigo of right ear 08/16/2019   Depression with anxiety 07/18/2019   Anxiety 08/17/2018   History of adenomatous polyp of colon 01/26/2018   Trigger ring finger of right hand 11/16/2017   Leg cramps 06/02/2016   Annual physical exam 08/11/2015   Hyperlipidemia 07/26/2015   Hypothyroidism 07/26/2015   Ulcerative colitis ([place]) 07/26/2015   Subcutaneous nodules 07/26/2015   Pain in joints 07/26/2015   Osteopenia 07/26/2015   Neutropenia ([place]) 07/26/2015   Neck muscle spasm 07/26/2015   Malignant neoplasm of breast ([place]) 07/26/2015   Limb swelling 07/26/2015   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

## 2020-12-16 ENCOUNTER — Ambulatory Visit (INDEPENDENT_AMBULATORY_CARE_PROVIDER_SITE_OTHER): Payer: Medicare Other

## 2020-12-16 ENCOUNTER — Other Ambulatory Visit: Payer: Self-pay

## 2020-12-16 ENCOUNTER — Ambulatory Visit (INDEPENDENT_AMBULATORY_CARE_PROVIDER_SITE_OTHER): Payer: Medicare Other | Admitting: Sports Medicine

## 2020-12-16 DIAGNOSIS — M5136 Other intervertebral disc degeneration, lumbar region: Secondary | ICD-10-CM

## 2020-12-16 DIAGNOSIS — R0789 Other chest pain: Secondary | ICD-10-CM | POA: Diagnosis not present

## 2020-12-16 DIAGNOSIS — R0781 Pleurodynia: Secondary | ICD-10-CM | POA: Insufficient documentation

## 2020-12-16 DIAGNOSIS — M51369 Other intervertebral disc degeneration, lumbar region without mention of lumbar back pain or lower extremity pain: Secondary | ICD-10-CM

## 2020-12-16 MED ORDER — PREDNISONE 10 MG (48) PO TBPK: ORAL_TABLET | Freq: Every day | ORAL | 0 refills | Status: DC

## 2020-12-16 NOTE — Assessment & Plan Note (Signed)
Stephanie Ramsey also has pain in her at her thoracolumbar junction, as well as left quadratus lumborum. She has known lumbar DDD, just finished a course of prednisone for another problem. We will do a 12-day taper, I would like updated thoracolumbar x-rays, we will get a urinalysis as she does have pain to the costovertebral angle as well. Home exercises given, return to see me in 6 weeks, MRI if no better.

## 2020-12-16 NOTE — Assessment & Plan Note (Addendum)
Exam is benign, left-sided chest pain worse with deep breathing, auscultation was normal, she did recently have COVID and influenza. Adding a D-dimer, chest x-Kreft and other labs.  Update: Elevated D-dimer, ordering stat CTA.

## 2020-12-16 NOTE — Progress Notes (Addendum)
    Procedures performed today:    None.  Independent interpretation of notes and tests performed by another provider:   None.  Brief History, Exam, Impression, and Recommendations:    Pleuritic chest pain Exam is benign, left-sided chest pain worse with deep breathing, auscultation was normal, she did recently have COVID and influenza. Adding a D-dimer, chest x-[place]avance and other labs.  Update: Elevated D-dimer, ordering stat CTA.  Lumbar degenerative disc disease Stephanie Ramsey also has pain in her at her thoracolumbar junction, as well as left quadratus lumborum. She has known lumbar DDD, just finished a course of prednisone for another problem. We will do a 12-day taper, I would like updated thoracolumbar x-rays, we will get a urinalysis as she does have pain to the costovertebral angle as well. Home exercises given, return to see me in 6 weeks, MRI if no better.    ___________________________________________ [name], M.D., ABFM., CAQSM. Primary Care and [place] Instructor of [place] of [facility] of Medicine

## 2020-12-17 ENCOUNTER — Ambulatory Visit (HOSPITAL_BASED_OUTPATIENT_CLINIC_OR_DEPARTMENT_OTHER)
Admission: RE | Admit: 2020-12-17 | Discharge: 2020-12-17 | Disposition: A | Discharge status: Home / Self Care | Payer: Medicare Other | Source: Ambulatory Visit | Attending: Sports Medicine | Admitting: Sports Medicine

## 2020-12-17 DIAGNOSIS — R0789 Other chest pain: Secondary | ICD-10-CM | POA: Insufficient documentation

## 2020-12-17 DIAGNOSIS — J984 Other disorders of lung: Secondary | ICD-10-CM | POA: Diagnosis not present

## 2020-12-17 DIAGNOSIS — I700 Atherosclerosis of aorta: Secondary | ICD-10-CM | POA: Diagnosis not present

## 2020-12-17 DIAGNOSIS — R0781 Pleurodynia: Secondary | ICD-10-CM | POA: Insufficient documentation

## 2020-12-17 DIAGNOSIS — Z9049 Acquired absence of other specified parts of digestive tract: Secondary | ICD-10-CM | POA: Diagnosis not present

## 2020-12-17 DIAGNOSIS — M19011 Primary osteoarthritis, right shoulder: Secondary | ICD-10-CM | POA: Diagnosis not present

## 2020-12-17 LAB — COMPREHENSIVE METABOLIC PANEL
AG Ratio: 1.7 (calc) (ref 1.0–2.5)
ALT: 60 U/L — ABNORMAL HIGH (ref 6–29)
AST: 62 U/L — ABNORMAL HIGH (ref 10–35)
Albumin: 4 g/dL (ref 3.6–5.1)
Alkaline phosphatase (APISO): 71 U/L (ref 37–153)
BUN: 11 mg/dL (ref 7–25)
CO2: 32 mmol/L (ref 20–32)
Calcium: 9.1 mg/dL (ref 8.6–10.4)
Chloride: 104 mmol/L (ref 98–110)
Creat: 0.78 mg/dL (ref 0.60–1.00)
Globulin: 2.4 g/dL (calc) (ref 1.9–3.7)
Glucose, Bld: 88 mg/dL (ref 65–139)
Potassium: 3.8 mmol/L (ref 3.5–5.3)
Sodium: 141 mmol/L (ref 135–146)
Total Bilirubin: 0.4 mg/dL (ref 0.2–1.2)
Total Protein: 6.4 g/dL (ref 6.1–8.1)

## 2020-12-17 LAB — CBC
HCT: 34.8 % — ABNORMAL LOW (ref 35.0–45.0)
Hemoglobin: 11.6 g/dL — ABNORMAL LOW (ref 11.7–15.5)
MCH: 32.8 pg (ref 27.0–33.0)
MCHC: 33.3 g/dL (ref 32.0–36.0)
MCV: 98.3 fL (ref 80.0–100.0)
MPV: 9.6 fL (ref 7.5–12.5)
Platelets: 254 10*3/uL (ref 140–400)
RBC: 3.54 10*6/uL — ABNORMAL LOW (ref 3.80–5.10)
RDW: 12 % (ref 11.0–15.0)
WBC: 6.7 10*3/uL (ref 3.8–10.8)

## 2020-12-17 LAB — URINALYSIS W MICROSCOPIC + REFLEX CULTURE
Bacteria, UA: NONE SEEN /HPF
Bilirubin Urine: NEGATIVE
Glucose, UA: NEGATIVE
Hgb urine dipstick: NEGATIVE
Hyaline Cast: NONE SEEN /LPF
Ketones, ur: NEGATIVE
Leukocyte Esterase: NEGATIVE
Nitrites, Initial: NEGATIVE
Protein, ur: NEGATIVE
RBC / HPF: NONE SEEN /HPF (ref 0–2)
Specific Gravity, Urine: 1.011 (ref 1.001–1.035)
Squamous Epithelial / HPF: NONE SEEN /HPF (ref <=5)
WBC, UA: NONE SEEN /HPF (ref 0–5)
pH: 6.5 (ref 5.0–8.0)

## 2020-12-17 LAB — NO CULTURE INDICATED

## 2020-12-17 LAB — D-DIMER, QUANTITATIVE: D-Dimer, Quant: 0.68 mcg/mL FEU — ABNORMAL HIGH (ref <0.50)

## 2020-12-17 MED ORDER — IOHEXOL 350 MG/ML SOLN
75.0000 mL | Freq: Once | INTRAVENOUS | Status: AC | PRN
  Administered 2020-12-17: 75 mL via INTRAVENOUS

## 2020-12-17 NOTE — Addendum Note (Signed)
Addended by: Silverio Decamp on: 12/17/2020 08:47 AM   Modules accepted: Orders

## 2020-12-17 NOTE — Progress Notes (Signed)
CTA (PE) study completed. Patient held until report was obtained. Tried to reach ordering provider (Dr. Dianah Field) thru after hours answering service. Report indicated no PE, so patient was allowed to go home. This information was relayed to on call nurse Grayland Jack and a contact # for patient was given to on call nurse.

## 2020-12-23 DIAGNOSIS — M12811 Other specific arthropathies, not elsewhere classified, right shoulder: Secondary | ICD-10-CM | POA: Diagnosis not present

## 2020-12-23 DIAGNOSIS — M25511 Pain in right shoulder: Secondary | ICD-10-CM | POA: Diagnosis not present

## 2021-01-01 ENCOUNTER — Other Ambulatory Visit: Payer: Self-pay | Admitting: Family Medicine

## 2021-01-01 DIAGNOSIS — H25013 Cortical age-related cataract, bilateral: Secondary | ICD-10-CM | POA: Diagnosis not present

## 2021-01-01 DIAGNOSIS — Z1231 Encounter for screening mammogram for malignant neoplasm of breast: Secondary | ICD-10-CM

## 2021-01-01 DIAGNOSIS — H2513 Age-related nuclear cataract, bilateral: Secondary | ICD-10-CM | POA: Diagnosis not present

## 2021-01-01 DIAGNOSIS — H35341 Macular cyst, hole, or pseudohole, right eye: Secondary | ICD-10-CM | POA: Diagnosis not present

## 2021-01-01 DIAGNOSIS — H43311 Vitreous membranes and strands, right eye: Secondary | ICD-10-CM | POA: Diagnosis not present

## 2021-01-11 ENCOUNTER — Other Ambulatory Visit: Payer: Self-pay | Admitting: Family Medicine

## 2021-01-12 ENCOUNTER — Ambulatory Visit (INDEPENDENT_AMBULATORY_CARE_PROVIDER_SITE_OTHER): Payer: Medicare Other

## 2021-01-12 ENCOUNTER — Other Ambulatory Visit: Payer: Self-pay

## 2021-01-12 ENCOUNTER — Encounter: Payer: Self-pay | Admitting: Family Medicine

## 2021-01-12 ENCOUNTER — Ambulatory Visit (INDEPENDENT_AMBULATORY_CARE_PROVIDER_SITE_OTHER): Payer: Medicare Other | Admitting: Family Medicine

## 2021-01-12 VITALS — BP 104/72 | HR 74 | Ht 63.0 in | Wt 142.0 lb

## 2021-01-12 DIAGNOSIS — M25532 Pain in left wrist: Secondary | ICD-10-CM

## 2021-01-12 DIAGNOSIS — R0781 Pleurodynia: Secondary | ICD-10-CM

## 2021-01-12 DIAGNOSIS — W19XXXA Unspecified fall, initial encounter: Secondary | ICD-10-CM

## 2021-01-12 DIAGNOSIS — F418 Other specified anxiety disorders: Secondary | ICD-10-CM

## 2021-01-12 MED ORDER — ESCITALOPRAM OXALATE 10 MG PO TABS: 15.0000 mg | ORAL_TABLET | Freq: Every day | ORAL | 1 refills | Status: DC

## 2021-01-12 NOTE — Assessment & Plan Note (Signed)
Increasing Lexapro to 15 mg daily.

## 2021-01-12 NOTE — Progress Notes (Signed)
Stephanie Ramsey - 70 y.o. female MRN [id number]  Date of birth: 12-22-1950  Subjective Chief Complaint  Patient presents with   Fall    HPI Stephanie Ramsey is a 70 year old female here today for follow-up of anxiety and depression.  She also had a fall yesterday after tripping over a barrier for her dogs.  She landed on the left side of her ribs and left wrist.  She has having pain over these areas.  She denies any bruising over the ribs.  She has had some swelling in the left wrist.  She is using a splint/brace she had at home.  Pain in her ribs is worse when trying to take a deep breath.  She also feels that she needs to increase her Lexapro heading into the winter months.  She tends to have worsening depression during this time.  She is tolerating this well at this time.  ROS:  A comprehensive ROS was completed and negative except as noted per HPI  Allergies  Allergen Reactions   Oxycontin [Oxycodone] Swelling and Other (See Comments)    HYPOTENSION   Penicillins Rash and Other (See Comments)    Unknown  Unknown    Propofol     Past Medical History:  Diagnosis Date   Arthritis    Cancer ([place])    right breast   Carpal tunnel syndrome    Chronic back pain    Complication of anesthesia    "stopped breathing during procedure and dizziness after procedures"   Hypercholesterolemia    Hypothyroidism    Pneumonia    Ulcerative colitis ([place])     Past Surgical History:  Procedure Laterality Date   BILATERAL CARPAL TUNNEL RELEASE     BREAST BIOPSY Right 2011   In Situ DCIS   BREAST LUMPECTOMY Right 2011   in situ    BREAST SURGERY     biopsy and lumpectomy right breast   CHOLECYSTECTOMY     COLONOSCOPY W/ BIOPSIES AND POLYPECTOMY     JOINT REPLACEMENT     Total right hip   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   Procedure: LEFT TOTAL HIP ARTHROPLASTY;  Surgeon: Garald Balding, MD;  Location: [facility];  Service: Orthopedics;  Laterality: Left;   TRIGGER FINGER RELEASE      Social  History   Socioeconomic History   Marital status: Married    Spouse name: Harrell Gave   Number of children: 2   Years of education: 18   Highest education level: Master's degree (e.g., MA, MS, MEng, MEd, MSW, MBA)  Occupational History    Comment: Retired  Tobacco Use   Smoking status: Former    Types: Cigarettes   Smokeless tobacco: Never   Tobacco comments:    quit smoking cigarettes 32 years ago  Vaping Use   Vaping Use: Never used  Substance and Sexual Activity   Alcohol use: Yes    Alcohol/week: 3.0 standard drinks    Types: 3 Glasses of wine per week    Comment: social   Drug use: No   Sexual activity: Yes    Birth control/protection: None  Other Topics Concern   Not on file  Social History Narrative   Lives with her husband. Currently visiting her sister for a funeral of her brother in law. Enjoys being outside.   Social Determinants of Health   Financial Resource Strain: Low Risk    Difficulty of Paying Living Expenses: Not hard at all  Food Insecurity: No Food Insecurity  Worried About Charity fundraiser in the Last Year: Never true   [place] in the Last Year: Never true  Transportation Needs: No Transportation Needs   Lack of Transportation (Medical): No   Lack of Transportation (Non-Medical): No  Physical Activity: Insufficiently Active   Days of Exercise per Week: 4 days   Minutes of Exercise per Session: 30 min  Stress: No Stress Concern Present   Feeling of Stress : Not at all  Social Connections: Socially Integrated   Frequency of Communication with Friends and Family: Twice a week   Frequency of Social Gatherings with Friends and Family: Once a week   Attends Religious Services: More than 4 times per year   Active Member of Clubs or Organizations: Yes   Attends Music therapist: More than 4 times per year   Marital Status: Married    Family History  Problem Relation Age of Onset   Breast cancer Mother    Arthritis Other      Health Maintenance  Topic Date Due   Zoster Vaccines- Shingrix (1 of 2) Never done   Pneumonia Vaccine 74+ Years old (3 - PPSV23 if available, else PCV20) 01/03/2019   COVID-19 Vaccine (4 - Booster for Moderna series) 02/26/2020   INFLUENZA VACCINE  09/15/2020   DEXA SCAN  06/20/2021 (Originally 12/12/2014)   Hepatitis C Screening  06/20/2021 (Originally 12/12/1967)   MAMMOGRAM  02/12/2022   COLONOSCOPY (Pts 45-49yr Insurance coverage will need to be confirmed)  08/27/2023   TETANUS/TDAP  05/04/2026   HPV VACCINES  Aged Out     ----------------------------------------------------------------------------------------------------------------------------------------------------------------------------------------------------------------- Physical Exam BP 104/72 (BP Location: Left Arm, Patient Position: Sitting, Cuff Size: Normal)   Pulse 74   Ht 5' 3"  (1.6 m)   Wt 142 lb (64.4 kg)   SpO2 98%   BMI 25.15 kg/m   Physical Exam Constitutional:      Appearance: Normal appearance.  Eyes:     General: No scleral icterus. Cardiovascular:     Rate and Rhythm: Normal rate and regular rhythm.  Pulmonary:     Effort: Pulmonary effort is normal.     Breath sounds: Normal breath sounds.  Chest:     Chest wall: Tenderness (Tenderness to palpation along the left lower ribs.) present.  Musculoskeletal:     Cervical back: Neck supple.     Comments: Left wrist with mild swelling.  Range of motion is normal in flexion, ulnar and radial deviation.  She does have pain with extension of the wrist.  Neurological:     Mental Status: She is alert.    ------------------------------------------------------------------------------------------------------------------------------------------------------------------------------------------------------------------- Assessment and Plan  Left wrist pain X-rays of left wrist ordered.  I personally reviewed these on do not see any fractures of the  wrist.  She may continue using splint.  Recommend application of ice several times throughout the day.  Rib pain on left side X-rays of left ribs ordered.  I personally reviewed these and do not see any obvious fractures.  Recommend application of ice to this area.  She may use pillow to help with splinting of coughing, laughing or sneezing.  Depression with anxiety Increasing Lexapro to 15 mg daily.   Meds ordered this encounter  Medications   escitalopram (LEXAPRO) 10 MG tablet    Sig: Take 1.5 tablets (15 mg total) by mouth daily.    Dispense:  135 tablet    Refill:  1    Return in about 4 months (around 05/12/2021) for MDD.  This visit occurred during the SARS-CoV-2 public health emergency.  Safety protocols were in place, including screening questions prior to the visit, additional usage of staff PPE, and extensive cleaning of exam room while observing appropriate contact time as indicated for disinfecting solutions.

## 2021-01-12 NOTE — Patient Instructions (Signed)
Have xrays completed.  Continue to use wrist splint.  Increase escitalopram to 29m daily (1.5 tablets).   See me again in 4 months.

## 2021-01-12 NOTE — Assessment & Plan Note (Signed)
X-rays of left wrist ordered.  I personally reviewed these on do not see any fractures of the wrist.  She may continue using splint.  Recommend application of ice several times throughout the day.

## 2021-01-12 NOTE — Assessment & Plan Note (Signed)
X-rays of left ribs ordered.  I personally reviewed these and do not see any obvious fractures.  Recommend application of ice to this area.  She may use pillow to help with splinting of coughing, laughing or sneezing.

## 2021-01-20 ENCOUNTER — Encounter: Payer: Self-pay | Admitting: Family Medicine

## 2021-01-27 ENCOUNTER — Other Ambulatory Visit: Payer: Self-pay

## 2021-01-27 ENCOUNTER — Ambulatory Visit (INDEPENDENT_AMBULATORY_CARE_PROVIDER_SITE_OTHER): Payer: Medicare Other | Admitting: Sports Medicine

## 2021-01-27 DIAGNOSIS — M51369 Other intervertebral disc degeneration, lumbar region without mention of lumbar back pain or lower extremity pain: Secondary | ICD-10-CM

## 2021-01-27 DIAGNOSIS — M5136 Other intervertebral disc degeneration, lumbar region: Secondary | ICD-10-CM

## 2021-01-27 DIAGNOSIS — Z23 Encounter for immunization: Secondary | ICD-10-CM | POA: Diagnosis not present

## 2021-01-27 NOTE — Progress Notes (Signed)
° ° °  Procedures performed today:    None.  Independent interpretation of notes and tests performed by another provider:   Rib x-rays personally reviewed, no obvious fractures.  Brief History, Exam, Impression, and Recommendations:    Lumbar degenerative disc disease Symptoms improved considerably with conservative treatment, she still has a bit of discomfort but really has not been consistent with her conditioning exercises, she will do the conditioning and get back to me if she needs me.  Of note she had a fall recently, impacted left chest wall, x-rays negative. She does not desire a rib belt, exam is unremarkable with the exception of a minimal amount of tenderness at the costochondral junction, return as needed for this as well.    ___________________________________________ [name], M.D., ABFM., CAQSM. Primary Care and [place] Instructor of [place] of [facility] of Medicine

## 2021-01-27 NOTE — Assessment & Plan Note (Signed)
Symptoms improved considerably with conservative treatment, she still has a bit of discomfort but really has not been consistent with her conditioning exercises, she will do the conditioning and get back to me if she needs me.  Of note she had a fall recently, impacted left chest wall, x-rays negative. She does not desire a rib belt, exam is unremarkable with the exception of a minimal amount of tenderness at the costochondral junction, return as needed for this as well.

## 2021-01-28 DIAGNOSIS — M170 Bilateral primary osteoarthritis of knee: Secondary | ICD-10-CM | POA: Diagnosis not present

## 2021-02-06 ENCOUNTER — Other Ambulatory Visit: Payer: Self-pay | Admitting: Family Medicine

## 2021-02-13 ENCOUNTER — Ambulatory Visit
Admission: RE | Admit: 2021-02-13 | Discharge: 2021-02-13 | Disposition: A | Discharge status: Home / Self Care | Payer: Medicare Other | Source: Ambulatory Visit | Attending: Family Medicine | Admitting: Family Medicine

## 2021-02-13 DIAGNOSIS — Z1231 Encounter for screening mammogram for malignant neoplasm of breast: Secondary | ICD-10-CM | POA: Diagnosis not present

## 2021-03-30 ENCOUNTER — Encounter: Payer: Self-pay | Admitting: Family Medicine

## 2021-04-06 DIAGNOSIS — H2513 Age-related nuclear cataract, bilateral: Secondary | ICD-10-CM | POA: Diagnosis not present

## 2021-04-06 DIAGNOSIS — H35341 Macular cyst, hole, or pseudohole, right eye: Secondary | ICD-10-CM | POA: Diagnosis not present

## 2021-04-06 DIAGNOSIS — H43821 Vitreomacular adhesion, right eye: Secondary | ICD-10-CM | POA: Diagnosis not present

## 2021-04-20 DIAGNOSIS — L578 Other skin changes due to chronic exposure to nonionizing radiation: Secondary | ICD-10-CM | POA: Diagnosis not present

## 2021-04-20 DIAGNOSIS — L308 Other specified dermatitis: Secondary | ICD-10-CM | POA: Diagnosis not present

## 2021-04-20 DIAGNOSIS — L821 Other seborrheic keratosis: Secondary | ICD-10-CM | POA: Diagnosis not present

## 2021-04-20 DIAGNOSIS — L570 Actinic keratosis: Secondary | ICD-10-CM | POA: Diagnosis not present

## 2021-05-10 ENCOUNTER — Other Ambulatory Visit: Payer: Self-pay | Admitting: Family Medicine

## 2021-05-12 ENCOUNTER — Ambulatory Visit: Payer: Medicare Other | Admitting: Family Medicine

## 2021-06-03 DIAGNOSIS — M170 Bilateral primary osteoarthritis of knee: Secondary | ICD-10-CM | POA: Diagnosis not present

## 2021-06-09 ENCOUNTER — Ambulatory Visit: Payer: Medicare Other | Admitting: Family Medicine

## 2021-06-16 ENCOUNTER — Ambulatory Visit (INDEPENDENT_AMBULATORY_CARE_PROVIDER_SITE_OTHER): Payer: Medicare Other | Admitting: Family Medicine

## 2021-06-16 ENCOUNTER — Encounter: Payer: Self-pay | Admitting: Family Medicine

## 2021-06-16 ENCOUNTER — Other Ambulatory Visit: Payer: Self-pay | Admitting: Family Medicine

## 2021-06-16 VITALS — BP 150/83 | HR 88 | Ht 63.0 in | Wt 140.0 lb

## 2021-06-16 DIAGNOSIS — D649 Anemia, unspecified: Secondary | ICD-10-CM | POA: Diagnosis not present

## 2021-06-16 DIAGNOSIS — R7301 Impaired fasting glucose: Secondary | ICD-10-CM | POA: Diagnosis not present

## 2021-06-16 DIAGNOSIS — R5383 Other fatigue: Secondary | ICD-10-CM

## 2021-06-16 DIAGNOSIS — Z780 Asymptomatic menopausal state: Secondary | ICD-10-CM

## 2021-06-16 DIAGNOSIS — G250 Essential tremor: Secondary | ICD-10-CM | POA: Insufficient documentation

## 2021-06-16 DIAGNOSIS — E039 Hypothyroidism, unspecified: Secondary | ICD-10-CM | POA: Diagnosis not present

## 2021-06-16 DIAGNOSIS — R0683 Snoring: Secondary | ICD-10-CM

## 2021-06-16 DIAGNOSIS — R519 Headache, unspecified: Secondary | ICD-10-CM

## 2021-06-16 DIAGNOSIS — D709 Neutropenia, unspecified: Secondary | ICD-10-CM

## 2021-06-16 DIAGNOSIS — Z0000 Encounter for general adult medical examination without abnormal findings: Secondary | ICD-10-CM

## 2021-06-16 DIAGNOSIS — F418 Other specified anxiety disorders: Secondary | ICD-10-CM

## 2021-06-16 NOTE — Assessment & Plan Note (Signed)
She has had morning headaches as well as snoring and fatigue which are concerning for sleep apnea.  Referral placed to discuss sleep study. ?

## 2021-06-16 NOTE — Patient Instructions (Signed)
Essential Tremor ?A tremor is trembling or shaking that a person cannot control. Most tremors affect the hands or arms. Tremors can also affect the head, vocal cords, legs, and other parts of the body. Essential tremor is a tremor without a known cause. Usually, it occurs while a person is trying to perform an action. It tends to get worse gradually as a person ages. ?What are the causes? ?The cause of this condition is not known, but it often runs in families. ?What increases the risk? ?You are more likely to develop this condition if: ?You have a family member with essential tremor. ?You are 40 years of age or older. ?What are the signs or symptoms? ?The main sign of a tremor is a rhythmic shaking of certain parts of your body that is uncontrolled and unintentional. You may: ?Have difficulty eating with a spoon or fork. ?Have difficulty writing. ?Nod your head up and down or side to side. ?Have a quivering voice. ?The shaking may: ?Get worse over time. ?Come and go. ?Be more noticeable on one side of your body. ?Get worse due to stress, tiredness (fatigue), caffeine, and extreme heat or cold. ?How is this diagnosed? ?This condition may be diagnosed based on: ?Your symptoms and medical history. ?A physical exam. ?There is no single test to diagnose an essential tremor. However, your health care provider may order tests to rule out other causes of your condition. These may include: ?Blood and urine tests. ?Imaging studies of your brain, such as a CT scan or MRI. ?How is this treated? ?Treatment for essential tremor depends on the severity of the condition. ?Mild tremors may not need treatment if they do not affect your day-to-day life. ?Severe tremors may need to be treated using one or more of the following options: ?Medicines. ?Injections of a substance called botulinum toxin. ?Procedures such as deep brain stimulation (DBS) implantation or MRI-guided ultrasound treatment. ?Lifestyle changes. ?Occupational or  physical therapy. ?Follow these instructions at home: ?Lifestyle ? ?Do not use any products that contain nicotine or tobacco. These products include cigarettes, chewing tobacco, and vaping devices, such as e-cigarettes. If you need help quitting, ask your health care provider. ?Limit your caffeine intake as told by your health care provider. ?Try to get 8 hours of sleep each night. ?Find ways to manage your stress that fit your lifestyle and personality. Consider trying meditation or yoga. ?Try to anticipate stressful situations and allow extra time to manage them. ?If you are struggling emotionally with the effects of your tremor, consider working with a mental health provider. ?General instructions ?Take over-the-counter and prescription medicines only as told by your health care provider. ?Avoid extreme heat and extreme cold. ?Keep all follow-up visits. This is important. Visits may include physical therapy visits. ?Where to find more information ?National Institute of Neurological Disorders and Stroke: www.ninds.nih.gov ?Contact a health care provider if: ?You experience any changes in the location or intensity of your tremors. ?You start having a tremor after starting a new medicine. ?You have a tremor with other symptoms, such as: ?Numbness. ?Tingling. ?Pain. ?Weakness. ?Your tremor gets worse. ?Your tremor interferes with your daily life. ?You feel down, blue, or sad for at least 2 weeks in a row. ?Worrying about your tremor and what other people think about you interferes with your everyday life functions, including relationships, work, or school. ?Summary ?Essential tremor is a tremor without a known cause. Usually, it occurs when you are trying to perform an action. ?You are more likely   to develop this condition if you have a family member with essential tremor. ?The main sign of a tremor is a rhythmic shaking of certain parts of your body that is uncontrolled and unintentional. ?Treatment for essential  tremor depends on the severity of the condition. ?This information is not intended to replace advice given to you by your health care provider. Make sure you discuss any questions you have with your health care provider. ?Document Revised: 11/21/2020 Document Reviewed: 11/21/2020 ?Elsevier Patient Education ? 2023 Elsevier Inc. ? ?

## 2021-06-16 NOTE — Assessment & Plan Note (Signed)
Suppressed TSH previously.  Obtain TSH today. ?

## 2021-06-16 NOTE — Assessment & Plan Note (Signed)
Not severe enough to the point where she would want to add any medication at this time.  She will let me know if there is any worsening. ?

## 2021-06-16 NOTE — Assessment & Plan Note (Signed)
She continues to do well with Lexapro at current strength.  Recommend continuation. ?

## 2021-06-16 NOTE — Progress Notes (Signed)
?Stephanie Ramsey - 70 y.o. female MRN [id number]  Date of birth: Aug 10, 1950 ? ?Subjective ?Chief Complaint  ?Patient presents with  ? Depression  ? ? ?HPI ?Stephanie Ramsey is a 71 y.o. female here today for follow up visit.  Reports that she is doing "Ok" today.  ? ?She continues on lexapro at 89m daily.  She feels that this continues to work well for her.  No significant side effects from this.   ? ?TSH suppressed on last labs. Never came back in for repeat labs.  She is on 1569m of levothyroxine.   ? ?She has had some morning headaches over the past few months.  She does snore pretty heavily.  She has had some fatigue as well.  ? ?Has noted some tremor that occurs when doing things such as writing or removing her sleep mask from her eyes.  Not too bothersome otherwise to the point where she would want to add medication to help with this. ? ?ROS:  A comprehensive ROS was completed and negative except as noted per HPI ? ?Allergies  ?Allergen Reactions  ? Oxycontin [Oxycodone] Swelling and Other (See Comments)  ?  HYPOTENSION  ? Penicillins Rash and Other (See Comments)  ?  Unknown  ?Unknown   ? Propofol   ? ? ?Past Medical History:  ?Diagnosis Date  ? Arthritis   ? [facility]  ? right [facility]  ? Carpal tunnel syndrome   ? Chronic back pain   ? Complication of anesthesia   ? "stopped breathing during procedure and dizziness after procedures"  ? Hypercholesterolemia   ? Hypothyroidism   ? Pneumonia   ? Ulcerative colitis ([facility]  ? ? ?Past Surgical History:  ?Procedure Laterality Date  ? BILATERAL CARPAL TUNNEL RELEASE    ? BREAST BIOPSY Right 2011  ? In Situ DCIS  ? BREAST LUMPECTOMY Right 2011  ? in situ   ? BREAST SURGERY    ? biopsy and lumpectomy right breast  ? CHOLECYSTECTOMY    ? COLONOSCOPY W/ BIOPSIES AND POLYPECTOMY    ? JOINT REPLACEMENT    ? Total right hip  ? 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  ? Procedure: LEFT TOTAL HIP ARTHROPLASTY;  Surgeon: PeGarald BaldingMD;  [place] [facility]  [place];  Laterality: Left;  ? TRIGGER FINGER RELEASE    ? ? ?Social History  ? ?Socioeconomic History  ? Marital status: Married  ?  Spouse name: ChHarrell Gave? Number of children: 2  ? Years of education: 1868? Highest education level: Master's degree (e.g., MA, MS, MEng, MEd, MSW, MBA)  ?Occupational History  ?  Comment: Retired  ?Tobacco Use  ? Smoking status: Former  ?  Types: Cigarettes  ? Smokeless tobacco: Never  ? Tobacco comments:  ?  quit smoking cigarettes 32 years ago  ?Vaping Use  ? Vaping Use: Never used  ?Substance and Sexual Activity  ? Alcohol use: Yes  ?  Alcohol/week: 3.0 standard drinks  ?  Types: 3 Glasses of wine per week  ?  Comment: social  ? Drug use: No  ? Sexual activity: Yes  ?  Birth control/protection: None  ?Other Topics Concern  ? Not on file  ?Social History Narrative  ? Lives with her husband. Currently visiting her sister for a funeral of her brother in law. Enjoys being outside.  ? ?Social Determinants of Health  ? ?Financial Resource Strain: Low Risk   ? Difficulty of Paying  Living Expenses: Not hard at all  ?Food Insecurity: No Food Insecurity  ? Worried About Charity fundraiser in the Last Year: Never true  ? Ran Out of Food in the Last Year: Never true  ?Transportation Needs: No Transportation Needs  ? Lack of Transportation (Medical): No  ? Lack of Transportation (Non-Medical): No  ?Physical Activity: Insufficiently Active  ? Days of Exercise per Week: 4 days  ? Minutes of Exercise per Session: 30 min  ?Stress: No Stress Concern Present  ? Feeling of Stress : Not at all  ?Social Connections: Socially Integrated  ? Frequency of Communication with Friends and Family: Twice a week  ? Frequency of Social Gatherings with Friends and Family: Once a week  ? Attends Religious Services: More than 4 times per year  ? Active Member of Clubs or Organizations: Yes  ? Attends Archivist Meetings: More than 4 times per year  ? Marital Status: Married  ? ? ?Family History   ?Problem Relation Age of Onset  ? Breast cancer Mother   ? Arthritis Other   ? ? ?Health Maintenance  ?Topic Date Due  ? Zoster Vaccines- Shingrix (1 of 2) Never done  ? Pneumonia Vaccine 51+ Years old (50) 01/03/2019  ? COVID-19 Vaccine (4 - Booster for Moderna series) 02/26/2020  ? DEXA SCAN  06/20/2021 (Originally 12/12/2014)  ? Hepatitis C Screening  06/20/2021 (Originally 12/12/1968)  ? INFLUENZA VACCINE  09/15/2021  ? MAMMOGRAM  02/14/2023  ? COLONOSCOPY (Pts 45-78yr Insurance coverage will need to be confirmed)  08/27/2023  ? TETANUS/TDAP  06/10/2031  ? HPV VACCINES  Aged Out  ? ? ? ?----------------------------------------------------------------------------------------------------------------------------------------------------------------------------------------------------------------- ?Physical Exam ?BP (!) 150/83 (BP Location: Left Arm, Patient Position: Sitting, Cuff Size: Normal)   Pulse 88   Ht 5' 3"  (1.6 m)   Wt 140 lb (63.5 kg)   SpO2 96%   BMI 24.80 kg/m?  ? ?Physical Exam ?Constitutional:   ?   Appearance: Normal appearance.  ?Eyes:  ?   General: No scleral icterus. ?Cardiovascular:  ?   Rate and Rhythm: Normal rate and regular rhythm.  ?Pulmonary:  ?   Effort: Pulmonary effort is normal.  ?   Breath sounds: Normal breath sounds.  ?Musculoskeletal:  ?   Cervical back: Neck supple.  ?Neurological:  ?   General: No focal deficit present.  ?   Mental Status: She is alert.  ?Psychiatric:     ?   Mood and Affect: Mood normal.     ?   Behavior: Behavior normal.  ? ? ?------------------------------------------------------------------------------------------------------------------------------------------------------------------------------------------------------------------- ?Assessment and Plan ? ?Hypothyroidism ?Suppressed TSH previously.  Obtain TSH today. ? ?Depression with anxiety ?She continues to do well with Lexapro at current strength.  Recommend continuation. ? ?Morning headache ?She  has had morning headaches as well as snoring and fatigue which are concerning for sleep apnea.  Referral placed to discuss sleep study. ? ?Essential tremor ?Not severe enough to the point where she would want to add any medication at this time.  She will let me know if there is any worsening. ? ? ?No orders of the defined types were placed in this encounter. ? ? ?No follow-ups on file. ? ? ? ?This visit occurred during the SARS-CoV-2 public health emergency.  Safety protocols were in place, including screening questions prior to the visit, additional usage of staff PPE, and extensive cleaning of exam room while observing appropriate contact time as indicated for disinfecting solutions.  ? ?

## 2021-06-17 ENCOUNTER — Ambulatory Visit (INDEPENDENT_AMBULATORY_CARE_PROVIDER_SITE_OTHER): Payer: Medicare Other

## 2021-06-17 DIAGNOSIS — Z780 Asymptomatic menopausal state: Secondary | ICD-10-CM | POA: Diagnosis not present

## 2021-06-17 DIAGNOSIS — M8589 Other specified disorders of bone density and structure, multiple sites: Secondary | ICD-10-CM | POA: Diagnosis not present

## 2021-06-17 DIAGNOSIS — Z0000 Encounter for general adult medical examination without abnormal findings: Secondary | ICD-10-CM

## 2021-06-17 LAB — CBC WITH DIFFERENTIAL/PLATELET
Absolute Monocytes: 359 cells/uL (ref 200–950)
Basophils Absolute: 42 cells/uL (ref 0–200)
Basophils Relative: 0.8 %
Eosinophils Absolute: 21 cells/uL (ref 15–500)
Eosinophils Relative: 0.4 %
HCT: 42.9 % (ref 35.0–45.0)
Hemoglobin: 14 g/dL (ref 11.7–15.5)
Lymphs Abs: 1784 cells/uL (ref 850–3900)
MCH: 32.5 pg (ref 27.0–33.0)
MCHC: 32.6 g/dL (ref 32.0–36.0)
MCV: 99.5 fL (ref 80.0–100.0)
MPV: 9.4 fL (ref 7.5–12.5)
Monocytes Relative: 6.9 %
Neutro Abs: 2995 cells/uL (ref 1500–7800)
Neutrophils Relative %: 57.6 %
Platelets: 257 10*3/uL (ref 140–400)
RBC: 4.31 10*6/uL (ref 3.80–5.10)
RDW: 12 % (ref 11.0–15.0)
Total Lymphocyte: 34.3 %
WBC: 5.2 10*3/uL (ref 3.8–10.8)

## 2021-06-17 LAB — COMPLETE METABOLIC PANEL WITH GFR
AG Ratio: 2.1 (calc) (ref 1.0–2.5)
ALT: 19 U/L (ref 6–29)
AST: 20 U/L (ref 10–35)
Albumin: 4.4 g/dL (ref 3.6–5.1)
Alkaline phosphatase (APISO): 52 U/L (ref 37–153)
BUN: 11 mg/dL (ref 7–25)
CO2: 27 mmol/L (ref 20–32)
Calcium: 9.8 mg/dL (ref 8.6–10.4)
Chloride: 102 mmol/L (ref 98–110)
Creat: 0.79 mg/dL (ref 0.60–1.00)
Globulin: 2.1 g/dL (calc) (ref 1.9–3.7)
Glucose, Bld: 92 mg/dL (ref 65–139)
Potassium: 4.1 mmol/L (ref 3.5–5.3)
Sodium: 139 mmol/L (ref 135–146)
Total Bilirubin: 0.5 mg/dL (ref 0.2–1.2)
Total Protein: 6.5 g/dL (ref 6.1–8.1)
eGFR: 80 mL/min/{1.73_m2} (ref >=60)

## 2021-06-17 LAB — IRON,TIBC AND FERRITIN PANEL
%SAT: 37 % (calc) (ref 16–45)
Ferritin: 81 ng/mL (ref 16–288)
Iron: 106 ug/dL (ref 45–160)
TIBC: 285 mcg/dL (calc) (ref 250–450)

## 2021-06-17 LAB — HEMOGLOBIN A1C
Hgb A1c MFr Bld: 4.9 % of total Hgb (ref <5.7)
Mean Plasma Glucose: 94 mg/dL
eAG (mmol/L): 5.2 mmol/L

## 2021-06-17 LAB — VITAMIN B12: Vitamin B-12: 584 pg/mL (ref 200–1100)

## 2021-06-17 LAB — TSH+FREE T4: TSH W/REFLEX TO FT4: 1.34 mIU/L (ref 0.40–4.50)

## 2021-06-29 ENCOUNTER — Ambulatory Visit (INDEPENDENT_AMBULATORY_CARE_PROVIDER_SITE_OTHER): Payer: Medicare Other | Admitting: Neurology

## 2021-06-29 ENCOUNTER — Encounter: Payer: Self-pay | Admitting: Neurology

## 2021-06-29 ENCOUNTER — Encounter: Payer: Self-pay | Admitting: Family Medicine

## 2021-06-29 VITALS — BP 152/73 | HR 62 | Ht 62.5 in | Wt 140.0 lb

## 2021-06-29 DIAGNOSIS — F329 Major depressive disorder, single episode, unspecified: Secondary | ICD-10-CM

## 2021-06-29 DIAGNOSIS — U099 Post covid-19 condition, unspecified: Secondary | ICD-10-CM

## 2021-06-29 DIAGNOSIS — G478 Other sleep disorders: Secondary | ICD-10-CM

## 2021-06-29 DIAGNOSIS — G9332 Myalgic encephalomyelitis/chronic fatigue syndrome: Secondary | ICD-10-CM

## 2021-06-29 DIAGNOSIS — R0683 Snoring: Secondary | ICD-10-CM | POA: Diagnosis not present

## 2021-06-29 NOTE — Progress Notes (Addendum)
? ? ?SLEEP MEDICINE CLINIC ?  ? ?Provider:  Larey Seat, MD  ?Primary Care Physician:  [name], DO ?[address] ?[place] [address]  ? ?  ?Referring Provider: [name], Do ?[address]  ?[address] ?[place],  [place] [address]  ?  ?  ?    ?Chief Complaint according to patient   ?Patient presents with:  ?  ? New Patient (Initial Visit)  ?   Rm 10, alone. NEW Pt referred to Sleep Consult for snoring, AM HA, fatigue, Pt reports low energy level last few months. Has general HA, sometime r sided HA. Occasionally takes Tylenol. No prior SS. Pt was also seen by her OD, reports having a dent and now a hole in the layer that protects her retina - suggested she have sleep apnea testing due to this.   ?  ?  ?HISTORY OF PRESENT ILLNESS:  ?Stephanie Ramsey is a 71 y.o.  Caucasian female married patient was seen upon referral by PCP on 06/29/2021 and is seen in the designated sleep Clinic [facility]. ?  ?Chief concern according to patient :   ? Stephanie Ramsey  has a past medical history of Hip Arthritis and replacement in 2011 and second in 2015, revision in 2018. Breast Cancer, right breast, level in-situ- 2011 [facility]), Carpal tunnel syndrome, Depression, Hypercholesterolemia, Hypothyroidism, Osteopenia, Covid 19 twice( 02-2019/ infusion of viral medication, and again 11-2020, Pneumonia, and Ulcerative colitis since age 19 [facility]). ?  ?Sleep relevant medical history: Nocturia once, Tonsillectomy;none,  ?cervical spine DDD, rhinitis.  ?  ?Family medical /sleep history:  other family member on CPAP with OSA, insomnia, sleep walkers. Husband uses CPAP.   ?  ?Social history:  Patient is retired from [employer] in [place], Equities trader and special needs, Environmental manager.  She  lives in a household with spouse,  one dog. Adult sons, 72 and daughter , 21. 3 granddaughters.   ?Tobacco use- Quit 40 years ago, related to birth of her second child- .  ETOH use ; wine 3/ week, Caffeine intake in form  of Coffee( 2 cups) Soda( /) Tea ( when eating out) . ?Regular exercise in form of  hiking, walking.   ?Hobbies :camping.  ? ?  ?  ?Sleep habits are as follows: The patient's dinner time is between 5-7 PM. The patient goes to bed at 10-11 PM and  there is no TV, continues to sleep for 8 hours, wakes for one bathroom breaks.   ?The preferred sleep position is supine , with the support of 1 pillow.  ?Dreams are reportedly frequent/vivid.  ?7  AM is the usual rise time. The patient wakes up spontaneously or by dog alarm.Marland Kitchen  ?She reports not feeling refreshed or restored in AM, with symptoms such as dry mouth, morning headaches( mostly generalized, dull), and residual fatigue. "My eyes are tired !" ?Naps are taken infrequently, she is fighting sleepiness to not sacrifice good nocturnal sleep.  ?  ?Review of Systems: ?Out of a complete 14 system review, the patient complains of only the following symptoms, and all other reviewed systems are negative.:  ?Fatigue, mouth breathing, non restorative sleep , snoring ?  ?How likely are you to doze in the following situations: ?0 = not likely, 1 = slight chance, 2 = moderate chance, 3 = high chance ?  ?Sitting and Reading? ?Watching Television? ?Sitting inactive in a public place (theater or meeting)? ?As a passenger in a  car for an hour without a break? ?Lying down in the afternoon when circumstances permit? ?Sitting and talking to someone? ?Sitting quietly after lunch without alcohol? ?In a car, while stopped for a few minutes in traffic? ?  ?Total = 7/ 24 points  ? FSS endorsed at 31/ 63 points.  Low energy. ? ?Social History  ? ?Socioeconomic History  ? Marital status: Married  ?  Spouse name: Harrell Gave  ? Number of children: 2  ? Years of education: 69  ? Highest education level: Master's degree (e.g., MA, MS, MEng, MEd, MSW, MBA)  ?Occupational History  ?  Comment: Retired  ?Tobacco Use  ? Smoking status: Former  ?  Types: Cigarettes  ?  Quit date: 10/12/1979  ?  Years  since quitting: 41.7  ? Smokeless tobacco: Never  ? Tobacco comments:  ?  quit smoking cigarettes 32 years ago  ?Vaping Use  ? Vaping Use: Never used  ?Substance and Sexual Activity  ? Alcohol use: Yes  ?  Alcohol/week: 3.0 standard drinks  ?  Types: 3 Glasses of wine per week  ?  Comment: social  ? Drug use: No  ? Sexual activity: Yes  ?  Birth control/protection: None  ?Other Topics Concern  ? Not on file  ?Social History Narrative  ? Lives with her husband.   ? R handed  ? Caffeine: 2 C a day, sometimes 1 C  ? ?Social Determinants of Health  ? ?Financial Resource Strain: Not on file  ?Food Insecurity: Not on file  ?Transportation Needs: Not on file  ?Physical Activity: Not on file  ?Stress: Not on file  ?Social Connections: Not on file  ? ? ?Family History  ?Problem Relation Age of Onset  ? Breast cancer Mother   ? Kidney failure Father   ? Heart Problems Father   ? Colon cancer Sister   ? Lung cancer Brother   ? Arthritis Other   ? ? ?Past Medical History:  ?Diagnosis Date  ? Arthritis   ? Cancer [facility])   ? right breast  ? Carpal tunnel syndrome   ? Chronic back pain   ? Complication of anesthesia   ? "stopped breathing during procedure and dizziness after procedures" ? ?COVID 19 02-2019, left with depression, some fatigue, dyspnoea.  ? Depression   ? Hypercholesterolemia   ? Hypothyroidism   ? Osteopenia   ? Pneumonia   ? Ulcerative colitis ([place])   ? ? ?Past Surgical History:  ?Procedure Laterality Date  ? BILATERAL CARPAL TUNNEL RELEASE    ? BREAST BIOPSY Right 2011  ? In Situ DCIS  ? BREAST LUMPECTOMY Right 2011  ? in situ   ? BREAST SURGERY    ? biopsy and lumpectomy right breast  ? CHOLECYSTECTOMY    ? COLONOSCOPY W/ BIOPSIES AND POLYPECTOMY    ? JOINT REPLACEMENT    ? Total right hip  ? 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  ? Procedure: LEFT TOTAL HIP ARTHROPLASTY;  Surgeon: Garald Balding, MD;  Location: [facility];  Service: Orthopedics;  Laterality: Left;  ? TRIGGER FINGER RELEASE    ?  ? ?Current  Outpatient Medications on File Prior to Visit  ?Medication Sig Dispense Refill  ? acyclovir (ZOVIRAX) 400 MG tablet Take by mouth. As needed.    ? atorvastatin (LIPITOR) 20 MG tablet TAKE 1 TABLET BY MOUTH EVERY DAY 90 tablet 1  ? Cinnamon 500 MG capsule Take 500 mg by mouth daily.    ? diclofenac sodium (  VOLTAREN) 1 % GEL APP 2 TO 4 GRAMS AA BID PRN P.    ? escitalopram (LEXAPRO) 10 MG tablet Take 1.5 tablets (15 mg total) by mouth daily. 135 tablet 1  ? folic acid (FOLVITE) 361 MCG tablet Take 400 mcg by mouth daily.    ? Ibuprofen-Famotidine 800-26.6 MG TABS Take by mouth as needed.     ? levothyroxine (SYNTHROID) 150 MCG tablet TAKE 1 TABLET BY MOUTH EVERY DAY 90 tablet 1  ? Magnesium 500 MG CAPS Take by mouth.    ? Multiple Vitamins-Minerals (MULTIVITAMIN WITH MINERALS) tablet Take 1 tablet by mouth daily.    ? Omega-3 Fatty Acids (FISH OIL) 1360 MG CAPS Take by mouth.    ? potassium gluconate 595 (99 K) MG TABS tablet Take 595 mg by mouth.    ? sulfaSALAzine (AZULFIDINE) 500 MG EC tablet Take 1,000 mg by mouth 2 (two) times daily.    ? triamcinolone cream (KENALOG) 0.1 % Apply topically 2 (two) times daily as needed.    ? valACYclovir (VALTREX) 500 MG tablet Take 500 mg by mouth 2 (two) times daily. As needed.    ? gabapentin (NEURONTIN) 300 MG capsule Take 1 capsule (300 mg total) by mouth every other day. 45 capsule 3  ? ?No current facility-administered medications on file prior to visit.  ? ? ?Allergies  ?Allergen Reactions  ? Oxycontin [Oxycodone] Swelling and Other (See Comments)  ?  HYPOTENSION  ? Penicillins Rash and Other (See Comments)  ?  Unknown  ?Unknown   ? Propofol   ? ? ?Physical exam: ? ?5'2" ?152/ 73 mmHg. ? HR 62 regular, ?RR 14/m ? ? ?Today's Vitals  ? 06/29/21 1046  ?BP: (!) 152/73  ?Pulse: 62  ?Weight: 140 lb (63.5 kg)  ?Height: 5' 2.5" (1.588 m)  ? ?Body mass index is 25.2 kg/m?.  ? ?Wt Readings from Last 3 Encounters:  ?06/29/21 140 lb (63.5 kg)  ?06/16/21 140 lb (63.5 kg)  ?01/12/21  142 lb (64.4 kg)  ?  ? ?Ht Readings from Last 3 Encounters:  ?06/29/21 5' 2.5" (1.588 m)  ?06/16/21 5' 3"  (1.6 m)  ?01/12/21 5' 3"  (1.6 m)  ?  ?  ?General: The patient is awake, alert and appears not in acute distr

## 2021-06-29 NOTE — Patient Instructions (Signed)
Hypersomnia ?Hypersomnia is a condition in which a person feels very tired during the day even though the person gets plenty of sleep at night. A person with this condition may take naps during the day and may find it very difficult to wake up from sleep. ?Hypersomnia may affect a person's ability to think, concentrate, drive, or remember things. ?What are the causes? ?The cause of this condition may not be known. Possible causes include: ?Taking certain medicines. ?Using drugs or alcohol. ?Sleep disorders, such as narcolepsy and sleep apnea. ?Injury to the head, brain, or spinal cord. ?Tumors. ?Certain medical conditions. These include: ?Depression. ?Diabetes. ?Gastroesophageal reflux disease (GERD). ?An underactive thyroid gland (hypothyroidism). ?What are the signs or symptoms? ?The main symptoms of hypersomnia include: ?Feeling very tired throughout the day, regardless of how much sleep you got the night before. ?Having trouble waking up. Others may find it difficult to wake you up when you are sleeping. ?Sleeping for longer and longer periods at a time. ?Taking naps throughout the day. ?Other symptoms may include: ?Feeling restless, anxious, or annoyed. ?Lacking energy. ?Having trouble with: ?Remembering. ?Speaking. ?Thinking. ?Loss of appetite. ?Seeing, hearing, tasting, smelling, or feeling things that are not real (hallucinations). ?How is this diagnosed? ?This condition may be diagnosed based on: ?Your symptoms and medical history. ?Your sleeping habits. Your health care provider may ask you to write down your sleeping habits in a daily sleep log, along with any symptoms you have. ?A series of tests that are done while you sleep (sleep study or polysomnogram). ?A test that measures how quickly you can fall asleep during the day (daytime nap study or multiple sleep latency test). ?How is this treated? ?This condition may be treated by: ?Following a regular sleep routine. ?Making lifestyle changes, such as  changing your eating habits, getting regular exercise, and avoiding alcohol or caffeinated beverages. ?Taking medicines to make you more alert (stimulants) during the day. ?Treating any underlying medical causes of hypersomnia. ?Follow these instructions at home: ?Sleep habits ?Stick to a routine that includes going to bed and waking up at the same times every day and night. ?Practice a relaxing bedtime routine. This may include reading, meditation, deep breathing, or taking a warm bath before going to sleep. ?Exercise regularly as told by your health care provider. However, avoid exercising in the hours right before bedtime. ?Keep your sleep environment at a cooler temperature, darkened, and quiet. ?Sleep with pillows and a mattress that are comfortable and supportive. ?Schedule short 20-minute naps for when you feel sleepiest during the day. ?Talk with your employer or teachers about your hypersomnia. If possible, adjust your schedule so that: ?You have a regular daytime work schedule. ?You can take a scheduled nap during the day. ?You do not have to work or be active at night. ?Do not eat a heavy meal for a few hours before bedtime. Eat your meals at about the same times every day. ?Safety ? ?Do not drive or use machinery if you are sleepy. Ask your health care provider if it is safe for you to drive. ?Wear a life jacket when swimming or spending time near water. ?General instructions ? ?Take over-the-counter and prescription medicines only as told by your health care provider. This includes supplements. ?Avoid drinking alcohol or caffeinated beverages. ?Keep a sleep log that will help your health care provider manage your condition. This may include information about: ?What time you go to bed each night. ?How often you wake up at night. ?How many hours  you sleep at night. ?How often and for how long you nap during the day. ?Any observations from others, such as leg movements during sleep, sleep walking, or  snoring. ?Keep all follow-up visits. This is important. ?Contact a health care provider if: ?You have new symptoms. ?Your symptoms get worse. ?Get help right away if: ?You have thoughts about hurting yourself or someone else. ?Get help right away if you feel like you may hurt yourself or others, or have thoughts about taking your own life. Go to your nearest emergency room or: ?Call 911. ?Call the [facility] at [phone] or 988. This is open 24 hours a day. ?Text the Crisis Text Line at 2075083786. ?Summary ?Hypersomnia refers to a condition in which you feel very tired during the day even though you get plenty of sleep at night. ?A person with this condition may take naps during the day and may find it very difficult to wake up from sleep. ?Hypersomnia may affect a person's ability to think, concentrate, drive, or remember things. ?Treatment may include a regular sleep routine and making some lifestyle changes. ?This information is not intended to replace advice given to you by your health care provider. Make sure you discuss any questions you have with your health care provider. ?Document Revised: 01/12/2021 Document Reviewed: 01/12/2021 ?Elsevier Patient Education ? [place]. ? ?

## 2021-07-08 ENCOUNTER — Other Ambulatory Visit: Payer: Self-pay | Admitting: Family Medicine

## 2021-07-14 DIAGNOSIS — H35341 Macular cyst, hole, or pseudohole, right eye: Secondary | ICD-10-CM | POA: Diagnosis not present

## 2021-07-14 DIAGNOSIS — H2513 Age-related nuclear cataract, bilateral: Secondary | ICD-10-CM | POA: Diagnosis not present

## 2021-07-14 DIAGNOSIS — H25013 Cortical age-related cataract, bilateral: Secondary | ICD-10-CM | POA: Diagnosis not present

## 2021-07-22 ENCOUNTER — Telehealth: Payer: Self-pay | Admitting: Neurology

## 2021-07-22 DIAGNOSIS — I63113 Cerebral infarction due to embolism of bilateral vertebral arteries: Secondary | ICD-10-CM

## 2021-07-22 DIAGNOSIS — R519 Headache, unspecified: Secondary | ICD-10-CM

## 2021-07-22 DIAGNOSIS — R0683 Snoring: Secondary | ICD-10-CM

## 2021-07-22 NOTE — Telephone Encounter (Signed)
Patient left a voicemail on my phone wanting to check the status on her sleep study, but I couldn't find anything that has been ordered.

## 2021-07-23 ENCOUNTER — Telehealth: Payer: Self-pay | Admitting: Neurology

## 2021-07-23 NOTE — Telephone Encounter (Signed)
[employer] medicare no auth req ref # [name] on 07/23/21,  patient is scheduled at [facility] for 07/29/21 at 8 pm.

## 2021-07-23 NOTE — Telephone Encounter (Signed)
[employer] medicare no auth req ref # [name] on 07/23/21,  patient is scheduled at [facility] for 07/29/21 at 8 pm.

## 2021-07-29 ENCOUNTER — Ambulatory Visit (INDEPENDENT_AMBULATORY_CARE_PROVIDER_SITE_OTHER): Payer: Medicare Other | Admitting: Neurology

## 2021-07-29 DIAGNOSIS — G4731 Primary central sleep apnea: Secondary | ICD-10-CM | POA: Diagnosis not present

## 2021-07-29 DIAGNOSIS — G4733 Obstructive sleep apnea (adult) (pediatric): Secondary | ICD-10-CM

## 2021-07-29 DIAGNOSIS — U099 Post covid-19 condition, unspecified: Secondary | ICD-10-CM

## 2021-07-29 DIAGNOSIS — R0683 Snoring: Secondary | ICD-10-CM

## 2021-07-29 DIAGNOSIS — I63113 Cerebral infarction due to embolism of bilateral vertebral arteries: Secondary | ICD-10-CM

## 2021-07-29 DIAGNOSIS — R519 Headache, unspecified: Secondary | ICD-10-CM

## 2021-07-29 DIAGNOSIS — K5180 Other ulcerative colitis without complications: Secondary | ICD-10-CM

## 2021-08-05 ENCOUNTER — Telehealth: Payer: Self-pay | Admitting: Neurology

## 2021-08-05 DIAGNOSIS — U099 Post covid-19 condition, unspecified: Secondary | ICD-10-CM | POA: Insufficient documentation

## 2021-08-05 DIAGNOSIS — R0602 Shortness of breath: Secondary | ICD-10-CM | POA: Insufficient documentation

## 2021-08-05 DIAGNOSIS — I63113 Cerebral infarction due to embolism of bilateral vertebral arteries: Secondary | ICD-10-CM | POA: Insufficient documentation

## 2021-08-05 NOTE — Addendum Note (Signed)
Addended by: Larey Seat on: 08/05/2021 03:12 PM   Modules accepted: Orders

## 2021-08-05 NOTE — Telephone Encounter (Signed)
-----   Message from Larey Seat, MD sent at 08/05/2021  3:11 PM EDT ----- 1. 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  2. NO evidence of Periodic Limb Movement Disorder (PLMD) 3. Moderate to loud Snoring 4. Intermittent bradycardic EKG   RECOMMENDATIONS:  1. Advise full night, attended, CPAP titration study to optimize therapy.  this patient may need Oxygen as well.  2. Complex sleep apnea can easily convert to central sleep apnea, and may require BiPAP or BiPAP ST.  3. If a return is not possible we will place order for auto CPAP, 6-18 cm water, 2 cm EPR , heated humidification and mask of comfort.

## 2021-08-05 NOTE — Procedures (Signed)
PATIENT'S NAME:  Stephanie Ramsey, Stephanie Ramsey DOB:      [birth year]      MR#:    [id number]     DATE OF RECORDING: 07/29/2021 REFERRING M.D.:  [name], DO Study Performed:   Baseline Polysomnogram HISTORY OF PRESENT ILLNESS:  Stephanie Ramsey is a 71 y.o.  Caucasian female married patient was seen upon referral by PCP on 06/29/2021 and is seen in the designated sleep Clinic at [facility]. Stephanie Ramsey has a past medical history of Hip Arthritis and replacement in 2011 and second in 2015, revision in 2018. Breast Cancer, right breast, level in-situ- 2011 [facility]), Carpal tunnel syndrome, Depression, Hypercholesterolemia, Hypothyroidism, Osteopenia, Covid 19 twice (02-2019/ infusion of viral medication, and again 11-2020, Pneumonia, and Ulcerative colitis since age 76 [facility]).   Sleep relevant medical history: Nocturia once, Tonsillectomy; none,  cervical spine DDD, rhinitis. Has general HA, sometime r sided HA. Occasionally takes Tylenol. No prior SS. Pt was also seen by her OD, reports having a "dent and now a hole in the layer that protects her retina" - suggested she have sleep apnea testing due to this.   The patient endorsed the Epworth Sleepiness Scale at 7 points.   The patient's weight 140 pounds with a height of 62 (inches), resulting in a BMI of 25.3 kg/m2. The patient's neck circumference measured 14 inches.  CURRENT MEDICATIONS: Zovirax, Lipitor, Cinnamon, Voltaren, Lexapro, Folvite, Ibuprofen-Famotidine, Synthroid, Magnesium, Multivitamins with minerals, Fish oil, 99K, Azulfidine, Valtrex, Neurontin   PROCEDURE:  This is a multichannel digital polysomnogram utilizing the Somnostar 11.2 system.  Electrodes and sensors were applied and monitored per AASM Specifications.   EEG, EOG, Chin and Limb EMG, were sampled at 200 Hz.  ECG, Snore and Nasal Pressure, Thermal Airflow, Respiratory Effort, CPAP Flow and Pressure, Oximetry was sampled at 50 Hz. Digital video and audio were recorded.      BASELINE STUDY: Lights Out  was at 21:56 and Lights On at 05:01.  Total recording time (TRT) was 425.5 minutes, with a total sleep time (TST) of 398 minutes.   The patient's sleep latency was 16 minutes.  REM latency was 272 minutes.  The sleep efficiency was 93.5 %.     SLEEP ARCHITECTURE: WASO (Wake after sleep onset) was 20 minutes.  There were 6.5 minutes in Stage N1, 274 minutes Stage N2, 48 minutes Stage N3 and 69.5 minutes in Stage REM.  The percentage of Stage N1 was 1.6%, Stage N2 was 68.8%, Stage N3 was 12.1% and Stage R (REM sleep) was 17.5%.   RESPIRATORY ANALYSIS:  There were a total of 85 respiratory events:  36 obstructive apneas, 5 central apneas and 5 mixed apneas with a total of 46 apneas and an apnea index (AI) of 6.9 /hour.  There were 39 hypopneas with a hypopnea index of 5.9 /hour.  The patient also had some respiratory event related arousals (RERAs).      The total APNEA/HYPOPNEA INDEX (AHI) was 12.8/hour and the total RESPIRATORY DISTURBANCE INDEX was  13.8 /hour.    34 events occurred in REM sleep and 81 events in NREM.  The REM AHI was  29.4 /hour, versus a non-REM AHI of 9.3/h.   The patient spent all 398 minutes of total sleep time in the supine position. The supine AHI was 12.8/h versus a non-supine AHI of 0.0.  OXYGEN SATURATION & C02:  The Wake baseline 02 saturation was 95%, with the lowest being 77%. Time spent below 89% saturation equaled 72 minutes.  The arousals were noted as: 21 were spontaneous, 0 were associated with PLMs, 25 were associated with respiratory events. The patient had a total of 0 Periodic Limb Movements Audio and video analysis did not show any abnormal or unusual movements, behaviors, phonations or vocalizations.  Moderate Snoring was noted. EKG was in keeping with normal sinus rhythm (NSR). Post-study, the patient indicated that sleep was the same as usual.   IMPRESSION:  Mild Complex sleep apnea ,dominantly Obstructive Sleep Apnea with hypopneas (OSA) with strong  REM dependency and REM dependent hypoxia.  NO evidence of Periodic Limb Movement Disorder (PLMD) Moderate to loud Snoring Intermittent bradycardic EKG   RECOMMENDATIONS:  Advise full night, attended, CPAP titration study to optimize therapy.  this patient may need Oxygen as well.  Complex sleep apnea can easily convert to central sleep apnea, and may require BiPAP or BiPAP ST.  If a return is not possible we will place order for auto CPAP, 6-18 cm water, 2 cm EPR , heated humidification and mask of comfort.     I certify that I have reviewed the entire raw data recording prior to the issuance of this report in accordance with the Standards of Accreditation of the [place] [employer] (AASM)   Larey Seat, MD Medical Director, [facility], [place] of Neurology and Sleep Medicine (Neurology and Sleep Medicine)

## 2021-08-05 NOTE — Telephone Encounter (Signed)
I called pt. I advised pt that Dr. Brett Fairy reviewed their sleep study results and found that has and recommends that pt be treated with a cpap. Dr. Brett Fairy recommends that pt return for a repeat sleep study in order to properly titrate the cpap and ensure a good mask fit. Pt is agreeable to returning for a titration study. I advised pt that our sleep lab will file with pt's insurance and call pt to schedule the sleep study when we hear back from the pt's insurance regarding coverage of this sleep study. Pt verbalized understanding of results. Pt had no questions at this time but was encouraged to call back if questions arise.

## 2021-08-05 NOTE — Progress Notes (Signed)
1. 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  2. NO evidence of Periodic Limb Movement Disorder (PLMD) 3. Moderate to loud Snoring 4. Intermittent bradycardic EKG   RECOMMENDATIONS:  1. Advise full night, attended, CPAP titration study to optimize therapy.  this patient may need Oxygen as well.  2. Complex sleep apnea can easily convert to central sleep apnea, and may require BiPAP or BiPAP ST.  3. If a return is not possible we will place order for auto CPAP, 6-18 cm water, 2 cm EPR , heated humidification and mask of comfort.

## 2021-08-06 ENCOUNTER — Other Ambulatory Visit: Payer: Self-pay | Admitting: Family Medicine

## 2021-08-06 ENCOUNTER — Encounter: Payer: Self-pay | Admitting: Neurology

## 2021-08-06 NOTE — Telephone Encounter (Signed)
CPAP Titration- BC[employer] Medicare auth: no auth req ref # Stephanie Ramsey on 08/06/21 [employer] [place] no auth req.   Patient is scheduled for 08/16/21 at 9 pm.   She was wanting to know if she does have to have a machine or something how is she going to get that before she leaves on 08/25/21??

## 2021-08-06 NOTE — Telephone Encounter (Signed)
Called the patient back to advise of the upcoming plan. I have spoke with [facility] and we will go ahead and get the orders for a auto CPAP set up for her sent to them so that they  There was no answer. I will send a message to the patient to update her on what our plan is.

## 2021-08-07 ENCOUNTER — Other Ambulatory Visit: Payer: Self-pay | Admitting: Neurology

## 2021-08-07 DIAGNOSIS — G478 Other sleep disorders: Secondary | ICD-10-CM

## 2021-08-07 DIAGNOSIS — R0683 Snoring: Secondary | ICD-10-CM

## 2021-08-16 ENCOUNTER — Ambulatory Visit (INDEPENDENT_AMBULATORY_CARE_PROVIDER_SITE_OTHER): Payer: Medicare Other | Admitting: Neurology

## 2021-08-16 ENCOUNTER — Other Ambulatory Visit: Payer: Self-pay

## 2021-08-16 ENCOUNTER — Emergency Department (INDEPENDENT_AMBULATORY_CARE_PROVIDER_SITE_OTHER)
Admission: RE | Admit: 2021-08-16 | Discharge: 2021-08-16 | Disposition: A | Discharge status: Home / Self Care | Payer: Medicare Other | Source: Ambulatory Visit

## 2021-08-16 VITALS — BP 153/92 | HR 71 | Temp 98.0°F | Resp 20 | Ht 62.5 in | Wt 136.0 lb

## 2021-08-16 DIAGNOSIS — L237 Allergic contact dermatitis due to plants, except food: Secondary | ICD-10-CM

## 2021-08-16 DIAGNOSIS — R0683 Snoring: Secondary | ICD-10-CM | POA: Diagnosis not present

## 2021-08-16 DIAGNOSIS — U099 Post covid-19 condition, unspecified: Secondary | ICD-10-CM

## 2021-08-16 DIAGNOSIS — G4733 Obstructive sleep apnea (adult) (pediatric): Secondary | ICD-10-CM

## 2021-08-16 DIAGNOSIS — G4731 Primary central sleep apnea: Secondary | ICD-10-CM

## 2021-08-16 DIAGNOSIS — R519 Headache, unspecified: Secondary | ICD-10-CM

## 2021-08-16 MED ORDER — PREDNISONE 10 MG (21) PO TBPK: ORAL_TABLET | Freq: Every day | ORAL | 0 refills | Status: DC

## 2021-08-16 MED ORDER — METHYLPREDNISOLONE SODIUM SUCC 125 MG IJ SOLR
125.0000 mg | Freq: Once | INTRAMUSCULAR | Status: AC
  Administered 2021-08-16: 125 mg via INTRAMUSCULAR

## 2021-08-16 NOTE — ED Provider Notes (Signed)
[name]    CSN: [id number] Arrival date & time: 08/16/21  1333      History   Chief Complaint Chief Complaint  Patient presents with   Poison Ivy    Spreading fast -need shot for Poison Ivy. - Entered by patient   Rash    HPI Stephanie Ramsey is a 71 y.o. female.   HPI Very pleasant 71 year old woman presents with worsening pruritic rash of genital/perineal area, bilateral inner thighs, buttocks and left posterior leg for 5 days.  Patient reports being in wounds and wiping herself with leaves while using bathroom 1 week ago.  PMH significant for malignant neoplasm of breast, CVA, and morbid obesity.  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/05/2021   Non-restorative sleep 06/29/2021   Snoring 06/29/2021   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   Depression, reactive 06/29/2021   Morning headache 06/16/2021   Essential tremor 06/16/2021   Rib pain on left side 01/12/2021   Left wrist pain 01/12/2021   Pleuritic chest pain 12/16/2020   Tenosynovitis of right wrist 12/03/2020   Abnormal prominence of clavicle 10/20/2020   Lumbar degenerative disc disease 12/10/2019   Benign paroxysmal positional vertigo of right ear 08/16/2019   Depression with anxiety 07/18/2019   Anxiety 08/17/2018   History of adenomatous polyp of colon 01/26/2018   Trigger ring finger of right hand 11/16/2017   Leg cramps 06/02/2016   Annual  physical exam 08/11/2015   Hyperlipidemia 07/26/2015   Hypothyroidism 07/26/2015   Ulcerative colitis ([place]) 07/26/2015   Subcutaneous nodules 07/26/2015   Pain in joints 07/26/2015   Osteopenia 07/26/2015   Neutropenia ([place]) 07/26/2015   Neck muscle spasm 07/26/2015   Malignant neoplasm of breast ([place]) 07/26/2015   Limb swelling 07/26/2015   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   IFG (impaired fasting glucose) 07/26/2015   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   Primary osteoarthritis of left hip 01/03/2014   S/P revision of total hip 01/01/2014    Past Surgical History:  Procedure Laterality Date   BILATERAL CARPAL TUNNEL RELEASE     BREAST BIOPSY Right 2011   In Situ DCIS   BREAST LUMPECTOMY Right 2011   in situ    BREAST SURGERY     biopsy and lumpectomy right breast   CHOLECYSTECTOMY     COLONOSCOPY W/ BIOPSIES AND POLYPECTOMY     JOINT REPLACEMENT     Total right hip   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   Procedure: LEFT TOTAL HIP ARTHROPLASTY;  Surgeon: Garald Balding, MD;  Location: [place];  Service: Orthopedics;  Laterality: Left;   TRIGGER FINGER RELEASE      OB History   No obstetric history on file.      Home Medications    Prior to Admission medications   Medication Sig Start Date End Date Taking? Authorizing Provider  predniSONE (  STERAPRED UNI-PAK 21 TAB) 10 MG (21) TBPK tablet Take by mouth daily. Take 6 tabs by mouth daily  for 2 days, then 5 tabs for 2 days, then 4 tabs for 2 days, then 3 tabs for 2 days, 2 tabs for 2 days, then 1 tab by mouth daily for 2 days 08/16/21  Yes Eliezer Lofts, FNP  acyclovir (ZOVIRAX) 400 MG tablet Take by mouth. As needed. 05/07/19   [provider]  atorvastatin (LIPITOR) 20 MG tablet TAKE 1 TABLET BY MOUTH EVERY DAY 08/06/21   [name], DO  diclofenac sodium (VOLTAREN) 1 % GEL APP 2 TO 4 GRAMS AA BID PRN P. 07/19/18   [provider]  escitalopram (LEXAPRO) 10 MG tablet TAKE 1 AND 1/2  TABLETS DAILY BY MOUTH 07/08/21   [name], DO  folic acid (FOLVITE) 500 MCG tablet Take 400 mcg by mouth daily.    [provider]  gabapentin (NEURONTIN) 300 MG capsule Take 1 capsule (300 mg total) by mouth every other day. 11/13/20 02/11/21  [name], DO  Ibuprofen-Famotidine 800-26.6 MG TABS Take by mouth as needed.     [provider]  levothyroxine (SYNTHROID) 150 MCG tablet TAKE 1 TABLET BY MOUTH EVERY DAY 05/12/21   [name], DO  Magnesium 500 MG CAPS Take by mouth.    [provider]  Multiple Vitamins-Minerals (MULTIVITAMIN WITH MINERALS) tablet Take 1 tablet by mouth daily.    [provider]  Omega-3 Fatty Acids (FISH OIL) 1360 MG CAPS Take by mouth.    [provider]  potassium gluconate 595 (99 K) MG TABS tablet Take 595 mg by mouth.    [provider]  sulfaSALAzine (AZULFIDINE) 500 MG EC tablet Take 1,000 mg by mouth 2 (two) times daily. 12/05/18   [provider]  triamcinolone cream (KENALOG) 0.1 % Apply topically 2 (two) times daily as needed. 04/16/20   [provider]  valACYclovir (VALTREX) 500 MG tablet Take 500 mg by mouth 2 (two) times daily. As needed.    [provider]    Family History Family History  Problem Relation Age of Onset   Breast cancer Mother    Kidney failure Father    Heart Problems Father    Colon cancer Sister    Lung cancer Brother    Arthritis Other     Social History Social History   Tobacco Use   Smoking status: Former    Types: Cigarettes    Quit date: 10/12/1979    Years since quitting: 41.8   Smokeless tobacco: Never   Tobacco comments:    quit smoking cigarettes 32 years ago  Vaping Use   Vaping Use: Never used  Substance Use Topics   Alcohol use: Yes    Alcohol/week: 3.0 standard drinks of alcohol    Types: 3 Glasses of wine per week    Comment: social   Drug use: No     Allergies   Oxycontin [oxycodone], Penicillins, and  Propofol   Review of Systems Review of Systems  Skin:  Positive for rash.  All other systems reviewed and are negative.    Physical Exam Triage Vital Signs ED Triage Vitals  Enc Vitals Group     BP      Pulse      Resp      Temp      Temp src      SpO2      Weight      Height  Head Circumference      Peak Flow      Pain Score      Pain Loc      Pain Edu?      Excl. in [place]?    No data found.  Updated Vital Signs BP (!) 153/92 (BP Location: Right Arm)   Pulse 71   Temp 98 F (36.7 C) (Oral)   Resp 20   Ht 5' 2.5" (1.588 m)   Wt 136 lb (61.7 kg)   SpO2 (!) 71%   BMI 24.48 kg/m   Physical Exam Vitals and nursing note reviewed. Exam conducted with a chaperone present.  Constitutional:      Appearance: Normal appearance. She is normal weight.  HENT:     Head: Normocephalic and atraumatic.     Mouth/Throat:     Mouth: Mucous membranes are moist.     Pharynx: Oropharynx is clear.  Eyes:     Extraocular Movements: Extraocular movements intact.     Conjunctiva/sclera: Conjunctivae normal.     Pupils: Pupils are equal, round, and reactive to light.  Cardiovascular:     Rate and Rhythm: Normal rate and regular rhythm.     Pulses: Normal pulses.     Heart sounds: Normal heart sounds.  Pulmonary:     Effort: Pulmonary effort is normal.     Breath sounds: Normal breath sounds. No wheezing, rhonchi or rales.  Musculoskeletal:     Cervical back: Normal range of motion and neck supple.  Skin:    General: Skin is warm and dry.     Comments: Left leg (posterior superior aspect), groin, inguinal crease, labia majora, perineum, anus: Pruritic erythematous maculopapular eruption with fine discrete linear vesicular lesions noted  Neurological:     General: No focal deficit present.     Mental Status: She is alert and oriented to person, place, and time.      UC Treatments / Results  Labs (all labs ordered are listed, but only abnormal results are  displayed) Labs Reviewed - No data to display  EKG   Radiology No results found.  Procedures Procedures (including critical care time)  Medications Ordered in UC Medications  methylPREDNISolone sodium succinate (SOLU-MEDROL) 125 mg/2 mL injection 125 mg (125 mg Intramuscular Given 08/16/21 1438)    Initial Impression / Assessment and Plan / UC Course  I have reviewed the triage vital signs and the nursing notes.  Pertinent labs & imaging results that were available during my care of the patient were reviewed by me and considered in my medical decision making (see chart for details).     MDM: 1.  Poison ivy dermatitis: Solu-Medrol 125 mg given IM once in clinic prior to discharge, Rx'd Sterapred Unipak. Instructed patient to take medication as directed with food to completion.  Advised patient to start Esther Hardy tomorrow morning Monday, 08/17/2021.  Advised/encouraged patient to change bed linens for the next 3 nights to avoid recontamination.  Encourage patient to increase daily water intake while taking this medication.  Advised patient if symptoms worsen and/or unresolved please follow-up with PCP or here for further evaluation.  Patient discharged home, hemodynamically stable.  Final Clinical Impressions(s) / UC Diagnoses   Final diagnoses:  Poison ivy dermatitis     Discharge Instructions      Instructed patient to take medication as directed with food to completion.  Advised patient to start Esther Hardy tomorrow morning Monday, 08/17/2021.  Advised/encouraged patient to change bed linens for the next 3  nights to avoid recontamination.  Encourage patient to increase daily water intake while taking this medication.  Advised patient if symptoms worsen and/or unresolved please follow-up with PCP or here for further evaluation.     ED Prescriptions     Medication Sig Dispense Auth. Provider   predniSONE (STERAPRED UNI-PAK 21 TAB) 10 MG (21) TBPK tablet Take by mouth  daily. Take 6 tabs by mouth daily  for 2 days, then 5 tabs for 2 days, then 4 tabs for 2 days, then 3 tabs for 2 days, 2 tabs for 2 days, then 1 tab by mouth daily for 2 days 42 tablet Eliezer Lofts, FNP      PDMP not reviewed this encounter.   Eliezer Lofts, [place] 08/16/21 1450

## 2021-08-16 NOTE — Discharge Instructions (Addendum)
Instructed patient to take medication as directed with food to completion.  Advised patient to start Stephanie Ramsey tomorrow morning Monday, 08/17/2021.  Advised/encouraged patient to change bed linens for the next 3 nights to avoid recontamination.  Encourage patient to increase daily water intake while taking this medication.  Advised patient if symptoms worsen and/or unresolved please follow-up with PCP or here for further evaluation.

## 2021-08-16 NOTE — ED Triage Notes (Signed)
Pt presents to Urgent Care with c/o rash to perineal area, bilateral inner thighs, buttocks, and back of L upper leg x 5 days. Reports being in the woods and wiping herself with leaves after using the bathroom one week ago.

## 2021-08-24 DIAGNOSIS — G4733 Obstructive sleep apnea (adult) (pediatric): Secondary | ICD-10-CM | POA: Diagnosis not present

## 2021-08-24 DIAGNOSIS — G4731 Primary central sleep apnea: Secondary | ICD-10-CM | POA: Insufficient documentation

## 2021-08-24 NOTE — Addendum Note (Signed)
Addended by: Larey Seat on: 08/24/2021 05:15 PM   Modules accepted: Orders

## 2021-08-24 NOTE — Procedures (Signed)
PATIENT'S NAME:  Stephanie Ramsey, Stephanie Ramsey DOB:      1950/04/03      MR#:    [id number]     DATE OF RECORDING: 08/16/2021 REFERRING M.D.:  [name], DO Study Performed:   CPAP  Titration HISTORY:  Stephanie Ramsey is a 71 y.o.  Caucasian female married patient was seen upon referral by PCP on 06/29/2021 and is seen in the designated sleep Clinic at [facility]. She has made a previous attempt at a PSG on 07-29-2021 baseline PSG confirmed sleep apnea, AHI of 12.8/h.     Mild but Complex sleep apnea in a dominantly Obstructive Sleep Apnea form with hypopneas (OSA) and with strong REM dependency and REM dependent hypoxia.  2.        NO evidence of Periodic Limb Movement Disorder (PLMD) 3.        Moderate to loud Snoring 4.        Intermittent bradycardic EKG.  In lab titration ordered, as this patient may need Oxygen as well.  2.        Complex sleep apnea can easily convert to central sleep apnea, and may require BiPAP or BiPAP ST. Stephanie Ramsey has a past medical history of Hip Arthritis and replacement in 2011 and second in 2015, revision in 2018. Breast Cancer, right breast, level in-situ- 2011 [facility]), Carpal tunnel syndrome, Depression, Hypercholesterolemia, Hypothyroidism, Osteopenia, Covid 19 twice (02-2019/ infusion of viral medication, and again 11-2020, Pneumonia, and Ulcerative colitis since age 24 [facility]).  The patient endorsed the Epworth Sleepiness Scale at 7/24 points and the Fatigue Score 31/63 points.   The patient's weight 141 pounds with a height of 62 (inches), resulting in a BMI of 26.0 kg/m2. The patient's neck circumference measured 14 inches.  CURRENT MEDICATIONS: Zovirax, Lipitor, Cinnamon, Voltaren, Lexapro, Folvite, Ibuprofen-Famotidine, Synthroid, Magnesium, Multivitamins with minerals, Fish oil, 99K, Azulfidine, Valtrex, Neurontin PROCEDURE:  This is a multichannel digital polysomnogram utilizing the SomnoStar 11.2 system.  Electrodes and sensors were applied and monitored per AASM  Specifications.   EEG, EOG, Chin and Limb EMG, were sampled at 200 Hz.  ECG, Snore and Nasal Pressure, Thermal Airflow, Respiratory Effort, CPAP Flow and Pressure, Oximetry was sampled at 50 Hz. Digital video and audio were recorded.      Evora FFM in S/M was chosen after discussion by Stephanie Ramsey, RPSGT, and changed later to Vitera size small.  CPAP was initiated at 5 cmH20 with heated humidity per AASM standards and pressure was advanced to 17 /cmH20 because of hypopneas, apneas and desaturations.  At a PAP pressure of 17 cmH20, there was a reduction of the AHI to 0.0 with Oxygen sat of 92 % and no snoring , all in all an improvement of sleep apnea. Lights Out was at 21:46 and Lights On at 05:09. Total recording time (TRT) was 443.5 minutes, with a total sleep time (TST) of 366.5 minutes. The patient's sleep latency was 32 minutes. REM latency was 413.5 minutes.  The sleep efficiency was 82.6 %.    SLEEP ARCHITECTURE: WASO (Wake after sleep onset)  was 61 minutes.  There were 14.5 minutes in Stage N1, 299.5 minutes Stage N2, 39 minutes Stage N3 and 13.5 minutes in Stage REM.  The percentage of Stage N1 was 4.%, Stage N2 was 81.7%, Stage N3 was 10.6% and Stage R (REM sleep) was 3.7%.  REM sleep began only 13 minutes before lights on.   RESPIRATORY ANALYSIS:  There was a total of 119  respiratory events: 105 obstructive apneas, 0 central apneas and 0 mixed apneas with a total of 105 apneas and an apnea index (AHI) of 17.2 /hour. There were 14 hypopneas with a hypopnea index of 2.3/hour.  The patient also had several respiratory event related arousals (RERAs).     The total APNEA/HYPOPNEA INDEX  (AHI) was 19.5 /hour and the total RESPIRATORY DISTURBANCE INDEX was 20.5 /hour  0 events occurred in REM sleep ( which was only present for 13.5 minutes) and 119 events in NREM.  The REM AHI was 0 /hour versus a non-REM AHI of 20.2 /hour.  The patient spent all 366.5 minutes of total sleep time in the supine  position.  The supine AHI was 19.5/h.  OXYGEN SATURATION & C02:  The baseline 02 saturation was 92%, with the lowest being 83%. Time spent below 89% saturation equaled 22 minutes. PERIODIC LIMB MOVEMENTS:  The patient had a total of 49 Periodic Limb Movements. The Periodic Limb Movement (PLM) Arousal index was 0.2 /hour. Audio and video analysis did not show any abnormal or unusual movements, behaviors, phonations or vocalizations.   No Snoring was noted. EKG was in keeping with sinus rhythm.  DIAGNOSIS At Baseline mild Obstructive Sleep Apnea was exacerbated under CPAP use, hypoxia persistent until 10 cm water were exceeded. Under use of an EVORA s/m FFM there was a good resolution of hypoxia and apnea seen under 15, 16 and 17 cm water pressure.  No Central Sleep Apnea was noted.   PLANS/RECOMMENDATIONS: Follow-up with sleep clinic NP after 60-90 days of PAP therapy. we will order an auto CPAP device for this patient, settings from 6-18 cm water pressure, small/medium size Evora FFM and heated humidification with a ResMed auto CPAP device.    DISCUSSION:A follow up appointment will be scheduled in the Sleep Clinic at [facility].   Please call [phone] with any questions.     I certify that I have reviewed the entire raw data recording prior to the issuance of this report in accordance with the Standards of Accreditation of the American Academy of Sleep Medicine (AASM) Larey Seat, M.D. Medical Director, [facility] at [employer] [employer] of Neurology and Sleep Medicine ( Neurology and Sleep Medicine)

## 2021-08-24 NOTE — Progress Notes (Signed)
OXYGEN SATURATION & C02:  The baseline 02 saturation was 92%, with the lowest being 83%. Time spent below 89% saturation equaled 22 minutes. PERIODIC LIMB MOVEMENTS:  The patient had a total of 49 Periodic Limb Movements. The Periodic Limb Movement (PLM) Arousal index was 0.2 /hour. Audio and video analysis did not show any abnormal or unusual movements, behaviors, phonations or vocalizations.   No Snoring was noted. EKG was in keeping with sinus rhythm.  DIAGNOSIS At Baseline mild Obstructive Sleep Apnea was exacerbated under CPAP use, hypoxia persistent until 10 cm water were exceeded. Under use of an EVORA s/m FFM there was a good resolution of hypoxia and apnea seen under 15, 16 and 17 cm water pressure.  1. No Central Sleep Apnea was noted.   PLANS/RECOMMENDATIONS: 1. Follow-up with sleep clinic NP after 60-90 days of PAP therapy. we will order an auto CPAP device for this patient, settings from 6-18 cm water pressure, small/medium size Evora FFM and heated humidification with a ResMed wifi capable  auto CPAP device.

## 2021-08-25 ENCOUNTER — Telehealth: Payer: Self-pay | Admitting: Neurology

## 2021-08-25 NOTE — Telephone Encounter (Signed)
-----   Message from Larey Seat, MD sent at 08/24/2021  5:15 PM EDT ----- OXYGEN SATURATION & C02:  The baseline 02 saturation was 92%, with the lowest being 83%. Time spent below 89% saturation equaled 22 minutes. PERIODIC LIMB MOVEMENTS:  The patient had a total of 49 Periodic Limb Movements. The Periodic Limb Movement (PLM) Arousal index was 0.2 /hour. Audio and video analysis did not show any abnormal or unusual movements, behaviors, phonations or vocalizations.   No Snoring was noted. EKG was in keeping with sinus rhythm.  DIAGNOSIS At Baseline mild Obstructive Sleep Apnea was exacerbated under CPAP use, hypoxia persistent until 10 cm water were exceeded. Under use of an EVORA s/m FFM there was a good resolution of hypoxia and apnea seen under 15, 16 and 17 cm water pressure.  1. No Central Sleep Apnea was noted.   PLANS/RECOMMENDATIONS: 1. Follow-up with sleep clinic NP after 60-90 days of PAP therapy. we will order an auto CPAP device for this patient, settings from 6-18 cm water pressure, small/medium size Evora FFM and heated humidification with a ResMed wifi capable  auto CPAP device.

## 2021-08-25 NOTE — Telephone Encounter (Signed)
Called the patient and reviewed the result with her. Due to the patient having a trip tomorrow she was set up already through [facility] with the cpap machine. The order was same as what is previously set so no changes are needed.

## 2021-09-23 DIAGNOSIS — G4733 Obstructive sleep apnea (adult) (pediatric): Secondary | ICD-10-CM | POA: Diagnosis not present

## 2021-09-24 DIAGNOSIS — G4733 Obstructive sleep apnea (adult) (pediatric): Secondary | ICD-10-CM | POA: Diagnosis not present

## 2021-10-07 ENCOUNTER — Encounter: Payer: Self-pay | Admitting: General Practice

## 2021-10-25 DIAGNOSIS — G4733 Obstructive sleep apnea (adult) (pediatric): Secondary | ICD-10-CM | POA: Diagnosis not present

## 2021-11-03 NOTE — Progress Notes (Addendum)
[facility] [address] [place]. [place] [address] [phone]       OFFICE FOLLOW UP NOTE  Ms. ARCHANA ECKMAN Date of Birth:  1950/12/21 Medical Record Number:  [id number]    Primary neurologist: Dr. Brett Fairy Reason for visit: Initial CPAP follow-up    SUBJECTIVE:   CHIEF COMPLAINT:  Chief Complaint  Patient presents with   Obstructive Sleep Apnea    Rm 3 alone Pt is well and stable, states she feels bloated now when waking up and taking CPAP off     HPI:   Update 11/04/2021 JM: Patient returns for initial CPAP compliance visit.  Completed sleep study back in June which showed mild complex sleep apnea, dominantly obstructive with a strong REM dependency and REM dependent hypoxia.  Pursued titration study for further treatment plan.  CPAP initiated 7/10.  Reports some difficulty tolerating CPAP and full face mask, has been having issues with mask leaking and skin irritation especially over the bridge of her nose.  She recently started using a new mask seal which helped irritation some but not completely. She also notes she feels quite bloated upon awakening in the morning.  Epworth Sleepiness Scale 7/24 (prior to CPAP 7/24).  Fatigue severity scale 29/63 (prior to CPAP 31/63)           Consult visit 06/29/2021 Dr. Brett Fairy: MECHELLE PATES is a 71 y.o.  Caucasian female married patient was seen upon referral by PCP on 06/29/2021 and is seen in the designated sleep Clinic at [facility]   Chief concern according to patient :    TALINA PLEITEZ  has a past medical history of Hip Arthritis and replacement in 2011 and second in 2015, revision in 2018. 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   Sleep relevant medical history: Nocturia once,  Tonsillectomy;none,  cervical spine DDD, rhinitis.    Family medical /sleep history:  other family member on CPAP with OSA, insomnia, sleep walkers. Husband uses CPAP.     Social history:  Patient is retired from [employer] in [place], Equities trader and special needs, Environmental manager.  She  lives in a household with spouse,  one dog. Adult sons, 81 and daughter , 65. 3 granddaughters.   Tobacco use- Quit 40 years ago, related to birth of her second child- .  ETOH use ; wine 3/ week, Caffeine intake in form of Coffee( 2 cups) Soda( /) Tea ( when eating out) . Regular exercise in form of  hiking, walking.   Hobbies :camping.   Sleep habits are as follows: The patient's dinner time is between 5-7 PM. The patient goes to bed at 10-11 PM and  there is no TV, continues to sleep for 8 hours, wakes for one bathroom breaks.   The preferred sleep position is supine , with the support of 1 pillow.  Dreams are reportedly frequent/vivid.  7  AM is the usual rise time. The patient wakes up spontaneously or by dog alarm..  She reports not feeling refreshed or restored in AM, with symptoms such as dry mouth, morning headaches( mostly generalized, dull), and residual fatigue. "My eyes are tired !" Naps are taken infrequently, she is fighting sleepiness to not sacrifice good nocturnal sleep.       ROS:   14 system review of systems performed and negative with exception of  those listed in HPI  PMH:  Past Medical History:  Diagnosis Date   Arthritis    Cancer ([facility])    right breast   Carpal tunnel syndrome    Chronic back pain    Complication of anesthesia    "stopped breathing during procedure and dizziness after procedures"   Depression    Hypercholesterolemia    Hypothyroidism    Osteopenia    Pneumonia    Ulcerative colitis ([place])     PSH:  Past Surgical History:  Procedure Laterality Date   BILATERAL CARPAL TUNNEL RELEASE     BREAST BIOPSY Right 2011   In Situ DCIS   BREAST  LUMPECTOMY Right 2011   in situ    BREAST SURGERY     biopsy and lumpectomy right breast   CHOLECYSTECTOMY     COLONOSCOPY W/ BIOPSIES AND POLYPECTOMY     JOINT REPLACEMENT     Total right hip   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   Procedure: LEFT TOTAL HIP ARTHROPLASTY;  Surgeon: Garald Balding, MD;  Location: [facility];  Service: Orthopedics;  Laterality: Left;   TRIGGER FINGER RELEASE      Social History:  Social History   Socioeconomic History   Marital status: Married    Spouse name: Harrell Gave   Number of children: 2   Years of education: 18   Highest education level: Master's degree (e.g., MA, MS, MEng, MEd, MSW, MBA)  Occupational History    Comment: Retired  Tobacco Use   Smoking status: Former    Types: Cigarettes    Quit date: 10/12/1979    Years since quitting: 42.0   Smokeless tobacco: Never   Tobacco comments:    quit smoking cigarettes 32 years ago  Vaping Use   Vaping Use: Never used  Substance and Sexual Activity   Alcohol use: Yes    Alcohol/week: 3.0 standard drinks of alcohol    Types: 3 Glasses of wine per week    Comment: social   Drug use: No   Sexual activity: Not on file  Other Topics Concern   Not on file  Social History Narrative   Lives with her husband.    R handed   Caffeine: 2 C a day, sometimes 1 C   Social Determinants of Health   Financial Resource Strain: Low Risk  (06/20/2020)   Overall Financial Resource Strain (CARDIA)    Difficulty of Paying Living Expenses: Not hard at all  Food Insecurity: No Food Insecurity (06/20/2020)   Hunger Vital Sign    Worried About Running Out of Food in the Last Year: Never true    Ran Out of Food in the Last Year: Never true  Transportation Needs: No Transportation Needs (06/20/2020)   PRAPARE - Hydrologist (Medical): No    Lack of Transportation (Non-Medical): No  Physical Activity: Insufficiently Active (06/20/2020)   Exercise Vital Sign    Days of Exercise per  Week: 4 days    Minutes of Exercise per Session: 30 min  Stress: No Stress Concern Present (06/20/2020)   [place]    Feeling of Stress : Not at all  Social Connections: Socially Integrated (06/20/2020)   Social Connection and Isolation Panel [NHANES]    Frequency of Communication with Friends and Family: Twice a week    Frequency of Social Gatherings with Friends and Family: Once a week    Attends Religious Services: More than 4  times per year    Active Member of Clubs or Organizations: Yes    Attends Archivist Meetings: More than 4 times per year    Marital Status: Married  Human resources officer Violence: Not At Risk (06/20/2020)   Humiliation, Afraid, Rape, and Kick questionnaire    Fear of Current or Ex-Partner: No    Emotionally Abused: No    Physically Abused: No    Sexually Abused: No    Family History:  Family History  Problem Relation Age of Onset   Breast cancer Mother    Kidney failure Father    Heart Problems Father    Colon cancer Sister    Lung cancer Brother    Arthritis Other     Medications:   Current Outpatient Medications on File Prior to Visit  Medication Sig Dispense Refill   acyclovir (ZOVIRAX) 400 MG tablet Take by mouth. As needed.     atorvastatin (LIPITOR) 20 MG tablet TAKE 1 TABLET BY MOUTH EVERY DAY 90 tablet 1   diclofenac sodium (VOLTAREN) 1 % GEL APP 2 TO 4 GRAMS AA BID PRN P.     escitalopram (LEXAPRO) 10 MG tablet TAKE 1 AND 1/2 TABLETS DAILY BY MOUTH 185 tablet 1   folic acid (FOLVITE) 631 MCG tablet Take 400 mcg by mouth daily.     gabapentin (NEURONTIN) 300 MG capsule Take 1 capsule (300 mg total) by mouth every other day. 45 capsule 3   Ibuprofen-Famotidine 800-26.6 MG TABS Take by mouth as needed.      levothyroxine (SYNTHROID) 150 MCG tablet TAKE 1 TABLET BY MOUTH EVERY DAY 90 tablet 1   Magnesium 500 MG CAPS Take by mouth.     Multiple Vitamins-Minerals (MULTIVITAMIN  WITH MINERALS) tablet Take 1 tablet by mouth daily.     Omega-3 Fatty Acids (FISH OIL) 1360 MG CAPS Take by mouth.     potassium gluconate 595 (99 K) MG TABS tablet Take 595 mg by mouth.     sulfaSALAzine (AZULFIDINE) 500 MG tablet Take 500 mg by mouth 3 (three) times daily.     triamcinolone cream (KENALOG) 0.1 % Apply topically 2 (two) times daily as needed.     valACYclovir (VALTREX) 500 MG tablet Take 500 mg by mouth 2 (two) times daily. As needed.     No current facility-administered medications on file prior to visit.    Allergies:   Allergies  Allergen Reactions   Oxycontin [Oxycodone] Swelling and Other (See Comments)    HYPOTENSION   Penicillins Rash and Other (See Comments)    Unknown  Unknown    Propofol       OBJECTIVE:  Physical Exam  Vitals:   11/04/21 1304  BP: 124/64  Pulse: 62  Weight: 147 lb (66.7 kg)  Height: 5' 2"  (1.575 m)   Body mass index is 26.89 kg/m. No results found.   General: well developed, well nourished, very pleasant elderly Caucasian female, seated, in no evident distress HEENT: head normocephalic and atraumatic.  Neck circumference 14".  Mallampati 1 but short and steep Cardiovascular: regular rate and rhythm, no murmurs Musculoskeletal: no deformity Skin:  no rash/petichiae Vascular:  Normal pulses all extremities   Neurologic Exam Mental Status: Awake and fully alert. Oriented to place and time. Recent and remote memory intact. Attention span, concentration and fund of knowledge appropriate. Mood and affect appropriate.  Cranial Nerves: Pupils equal, briskly reactive to light. Extraocular movements full without nystagmus. Visual fields full to confrontation. Hearing intact. Facial sensation  intact. Face, tongue, palate moves normally and symmetrically.  Motor: Normal bulk and tone. Normal strength in all tested extremity muscles Sensory.: intact to touch , pinprick , position and vibratory sensation.  Coordination: Rapid  alternating movements normal in all extremities. Finger-to-nose and heel-to-shin performed accurately bilaterally. Gait and Station: Arises from chair without difficulty. Stance is normal. Gait demonstrates normal stride length and balance without use of AD. Tandem walk and heel toe without difficulty.  Reflexes: 1+ and symmetric. Toes downgoing.         ASSESSMENT/PLAN: MARIZA BOURGET is a 71 y.o. year old female     Complex sleep apnea on CPAP : Compliance report shows satisfactory usage although slightly elevated AHI.  Order will be placed for mask refitting due to leaks and skin irritation.  Will adjust pressure setting from 6-18 to 6-17 in hopes to helping bloatedness sensation and evidence of central apneas. Will repeat download in 6-8 weeks. Discussed continued nightly usage with ensuring greater than 4 hours nightly for optimal benefit and per insurance purposes.  Continue to follow with DME company for any needed supplies or CPAP related concerns   ADDENDUM: Repeat download improvement of residual AHI at 4.5 over the past 2 weeks and pressure setting of 6-17.  Recommend continue pressure settings at this time.   Follow up in 6 months or call earlier if needed   CC:  PCP: [name], DO    I spent 26 minutes of face-to-face and non-face-to-face time with patient.  This included previsit chart review, lab review, study review, order entry, electronic health record documentation, patient education regarding diagnosis of sleep apnea with review and discussion of compliance report and answered all other questions to patient's satisfaction   Frann Rider, [facility]  Dr [facility] [address] [place], [place] [address]  Phone [phone] Fax [phone] Note: This document was prepared with digital dictation and possible smart phrase technology. Any transcriptional errors that result from this process are unintentional.

## 2021-11-04 ENCOUNTER — Encounter: Payer: Self-pay | Admitting: Adult Health

## 2021-11-04 ENCOUNTER — Ambulatory Visit (INDEPENDENT_AMBULATORY_CARE_PROVIDER_SITE_OTHER): Payer: Medicare Other | Admitting: Adult Health

## 2021-11-04 VITALS — BP 124/64 | HR 62 | Ht 62.0 in | Wt 147.0 lb

## 2021-11-04 DIAGNOSIS — G4731 Primary central sleep apnea: Secondary | ICD-10-CM | POA: Diagnosis not present

## 2021-11-04 DIAGNOSIS — Z9989 Dependence on other enabling machines and devices: Secondary | ICD-10-CM | POA: Diagnosis not present

## 2021-11-04 NOTE — Progress Notes (Signed)
Cpap orders have been placed and faxed to DME: [facility] on file.

## 2021-11-04 NOTE — Patient Instructions (Signed)
I will adjust your pressure settings to see if this helps feeling less bloated in the morning. I will recheck a CPAP download in 6-8 weeks to see how everything looks. If symptoms persist, please let us know  Recommend following up with your DME company [facility] for mask refitting    Follow up in 6 months or call earlier if needed

## 2021-11-05 ENCOUNTER — Other Ambulatory Visit: Payer: Self-pay | Admitting: Family Medicine

## 2021-11-06 ENCOUNTER — Ambulatory Visit (INDEPENDENT_AMBULATORY_CARE_PROVIDER_SITE_OTHER): Payer: Medicare Other | Admitting: Family Medicine

## 2021-11-06 VITALS — BP 129/62 | HR 65 | Ht 62.5 in | Wt 147.1 lb

## 2021-11-06 DIAGNOSIS — Z0000 Encounter for general adult medical examination without abnormal findings: Secondary | ICD-10-CM | POA: Diagnosis not present

## 2021-11-06 DIAGNOSIS — Z23 Encounter for immunization: Secondary | ICD-10-CM | POA: Diagnosis not present

## 2021-11-06 NOTE — Patient Instructions (Addendum)
[facility] Maintenance Summary and Written Plan of Care  Stephanie Ramsey ,  Thank you for allowing me to perform your Medicare Annual Wellness Visit and for your ongoing commitment to your health.   Health Maintenance & Immunization History Health Maintenance  Topic Date Due   COVID-19 Vaccine (6 - Moderna risk series) 11/22/2021 (Originally 03/24/2021)   INFLUENZA VACCINE  05/16/2022 (Originally 09/15/2021)   Pneumonia Vaccine 17+ Years old (3 - PPSV23 or PCV20) 06/17/2022 (Originally 01/03/2019)   Hepatitis C Screening  11/07/2022 (Originally 12/12/1968)   MAMMOGRAM  02/14/2023   COLONOSCOPY (Pts 45-57yr Insurance coverage will need to be confirmed)  08/27/2023   TETANUS/TDAP  06/10/2031   DEXA SCAN  Completed   Zoster Vaccines- Shingrix  Completed   HPV VACCINES  Aged OUniversal HealthHistory  Administered Date(s) Administered   Fluad Quad(high Dose 65+) 12/17/2018   Influenza Split 12/14/2004, 11/22/2011, 12/11/2012, 11/27/2013, 12/03/2016   Influenza, High Dose Seasonal PF 01/04/2016, 01/02/2018, 11/08/2018, 12/04/2019   Influenza-Unspecified 12/14/2004, 11/22/2011, 12/11/2012, 11/27/2013, 12/03/2016   Moderna Covid-19 Vaccine Bivalent Booster 161yr& up 01/27/2021   Moderna Sars-Covid-2 Vaccination 05/29/2019, 06/26/2019, 01/01/2020, 10/13/2020   Pneumococcal Conjugate-13 08/13/2016   Pneumococcal Polysaccharide-23 01/02/2014   Rabies Immune Globulin 05/15/2018, 05/18/2018, 05/22/2018, 05/29/2018   Rabies, IM 05/15/2018, 05/18/2018, 05/22/2018, 05/29/2018   Tdap 12/16/2006, 06/09/2021   Zoster Recombinat (Shingrix) 08/13/2016, 12/21/2016   Zoster, Live 09/28/2010, 12/21/2016    These are the patient goals that we discussed:  Goals Addressed               This Visit's Progress     Patient Stated (pt-stated)        11/06/2021 AWV Goal: Exercise for General Health  Patient will verbalize understanding of the benefits of increased physical  activity: Exercising regularly is important. It will improve your overall fitness, flexibility, and endurance. Regular exercise also will improve your overall health. It can help you control your weight, reduce stress, and improve your bone density. Over the next year, patient will increase physical activity as tolerated with a goal of at least 150 minutes of moderate physical activity per week.  You can tell that you are exercising at a moderate intensity if your heart starts beating faster and you start breathing faster but can still hold a conversation. Moderate-intensity exercise ideas include: Walking 1 mile (1.6 km) in about 15 minutes Biking Hiking Golfing Dancing Water aerobics Patient will verbalize understanding of everyday activities that increase physical activity by providing examples like the following: Yard work, such as: PuSales promotion account executiveardening Washing windows or floors Patient will be able to explain general safety guidelines for exercising:  Before you start a new exercise program, talk with your health care provider. Do not exercise so much that you hurt yourself, feel dizzy, or get very short of breath. Wear comfortable clothes and wear shoes with good support. Drink plenty of water while you exercise to prevent dehydration or heat stroke. Work out until your breathing and your heartbeat get faster.          This is a list of Health Maintenance Items that are overdue or due now: Pneumococcal vaccine  Influenza vaccine Mammogram- due in December, 2023.  Orders/Referrals Placed Today: Orders Placed This Encounter  Procedures   Pneumococcal conjugate vaccine 20-valent (Prevnar 20)   (Contact our referral department at [phone] you have not spoken with  someone about your referral appointment within the next 5 days)    Follow-up Plan Follow-up with [name], DO  as planned Mammogram due in December, 2023. Medicare wellness visit in one year.  AVS printed and given to the patient.      Health Maintenance, Female Adopting a healthy lifestyle and getting preventive care are important in promoting health and wellness. Ask your health care provider about: The right schedule for you to have regular tests and exams. Things you can do on your own to prevent diseases and keep yourself healthy. What should I know about diet, weight, and exercise? Eat a healthy diet  Eat a diet that includes plenty of vegetables, fruits, low-fat dairy products, and lean protein. Do not eat a lot of foods that are high in solid fats, added sugars, or sodium. Maintain a healthy weight Body mass index (BMI) is used to identify weight problems. It estimates body fat based on height and weight. Your health care provider can help determine your BMI and help you achieve or maintain a healthy weight. Get regular exercise Get regular exercise. This is one of the most important things you can do for your health. Most adults should: Exercise for at least 150 minutes each week. The exercise should increase your heart rate and make you sweat (moderate-intensity exercise). Do strengthening exercises at least twice a week. This is in addition to the moderate-intensity exercise. Spend less time sitting. Even light physical activity can be beneficial. Watch cholesterol and blood lipids Have your blood tested for lipids and cholesterol at 72 years of age, then have this test every 5 years. Have your cholesterol levels checked more often if: Your lipid or cholesterol levels are high. You are older than 73 years of age. You are at high risk for heart disease. What should I know about cancer screening? Depending on your health history and family history, you may need to have cancer screening at various ages. This may include screening for: Breast cancer. Cervical cancer. Colorectal  cancer. Skin cancer. Lung cancer. What should I know about heart disease, diabetes, and high blood pressure? Blood pressure and heart disease High blood pressure causes heart disease and increases the risk of stroke. This is more likely to develop in people who have high blood pressure readings or are overweight. Have your blood pressure checked: Every 3-5 years if you are 21-52 years of age. Every year if you are 24 years old or older. Diabetes Have regular diabetes screenings. This checks your fasting blood sugar level. Have the screening done: Once every three years after age 19 if you are at a normal weight and have a low risk for diabetes. More often and at a younger age if you are overweight or have a high risk for diabetes. What should I know about preventing infection? Hepatitis B If you have a higher risk for hepatitis B, you should be screened for this virus. Talk with your health care provider to find out if you are at risk for hepatitis B infection. Hepatitis C Testing is recommended for: Everyone born from [birth year] through 1966. Anyone with known risk factors for hepatitis C. Sexually transmitted infections (STIs) Get screened for STIs, including gonorrhea and chlamydia, if: You are sexually active and are younger than 72 years of age. You are older than 72 years of age and your health care provider tells you that you are at risk for this type of infection. Your sexual activity has changed since you were last screened,  and you are at increased risk for chlamydia or gonorrhea. Ask your health care provider if you are at risk. Ask your health care provider about whether you are at high risk for HIV. Your health care provider may recommend a prescription medicine to help prevent HIV infection. If you choose to take medicine to prevent HIV, you should first get tested for HIV. You should then be tested every 3 months for as long as you are taking the medicine. Pregnancy If you are  about to stop having your period (premenopausal) and you may become pregnant, seek counseling before you get pregnant. Take 400 to 800 micrograms (mcg) of folic acid every day if you become pregnant. Ask for birth control (contraception) if you want to prevent pregnancy. Osteoporosis and menopause Osteoporosis is a disease in which the bones lose minerals and strength with aging. This can result in bone fractures. If you are 41 years old or older, or if you are at risk for osteoporosis and fractures, ask your health care provider if you should: Be screened for bone loss. Take a calcium or vitamin D supplement to lower your risk of fractures. Be given hormone replacement therapy (HRT) to treat symptoms of menopause. Follow these instructions at home: Alcohol use Do not drink alcohol if: Your health care provider tells you not to drink. You are pregnant, may be pregnant, or are planning to become pregnant. If you drink alcohol: Limit how much you have to: 0-1 drink a day. Know how much alcohol is in your drink. In the [place] one drink equals one 12 oz bottle of beer (355 mL), one 5 oz glass of wine (148 mL), or one 1 oz glass of hard liquor (44 mL). Lifestyle Do not use any products that contain nicotine or tobacco. These products include cigarettes, chewing tobacco, and vaping devices, such as e-cigarettes. If you need help quitting, ask your health care provider. Do not use street drugs. Do not share needles. Ask your health care provider for help if you need support or information about quitting drugs. General instructions Schedule regular health, dental, and eye exams. Stay current with your vaccines. Tell your health care provider if: You often feel depressed. You have ever been abused or do not feel safe at home. Summary Adopting a healthy lifestyle and getting preventive care are important in promoting health and wellness. Follow your health care provider's instructions about  healthy diet, exercising, and getting tested or screened for diseases. Follow your health care provider's instructions on monitoring your cholesterol and blood pressure. This information is not intended to replace advice given to you by your health care provider. Make sure you discuss any questions you have with your health care provider. Document Revised: 06/23/2020 Document Reviewed: 06/23/2020 Elsevier Patient Education  [place].

## 2021-11-06 NOTE — Progress Notes (Signed)
MEDICARE ANNUAL WELLNESS VISIT  11/06/2021  Subjective:  Stephanie Ramsey is a 71 y.o. female patient of [name], DO who had a Medicare Annual Wellness Visit today. Armandina is Retired and lives with their spouse. she has 2 children. she reports that she is socially active and does interact with friends/family regularly. she is moderately physically active and enjoys walking and playing with her dog.  Patient Care Team: [name], DO as PCP - General (Family Medicine) Hinda Lenis, MD as Referring Physician (Orthopedic Surgery) Richmond Campbell, MD as Consulting Physician (Gastroenterology)     11/06/2021    9:57 AM 12/04/2020    8:52 AM 06/20/2020    9:56 AM 02/21/2019    9:27 AM 05/15/2018    5:13 PM 12/21/2013    3:51 PM  Advanced Directives  Does Patient Have a Medical Advance Directive? Yes Yes Yes Yes No Yes  Type of Advance Directive Living will [place];Living will [place];Living will [place];Living will  [place];Living will  Does patient want to make changes to medical advance directive? No - Patient declined Yes (MAU/Ambulatory/Procedural Areas - Information given) No - Patient declined No - Patient declined  No - Patient declined  Copy of [place] in Chart?   No - copy requested Yes - validated most recent copy scanned in chart (See row information)  No - copy requested  Would patient like information on creating a medical advance directive?     No - Patient declined     Hospital Utilization Over the Past 12 Months: # of hospitalizations or ER visits: 0 # of surgeries: 0  Review of Systems    Patient reports that her overall health is worse when compared to last year.  Review of Systems: History obtained from chart review and the patient  All other systems negative.  Pain Assessment Pain : No/denies pain     Current Medications & Allergies  (verified) Allergies as of 11/06/2021       Reactions   Oxycontin [oxycodone] Swelling, Other (See Comments)   HYPOTENSION   Penicillins Rash, Other (See Comments)   Unknown  Unknown    Propofol         Medication List        Accurate as of November 06, 2021 10:36 AM. If you have any questions, ask your nurse or doctor.          acyclovir 400 MG tablet Commonly known as: ZOVIRAX Take by mouth. As needed.   atorvastatin 20 MG tablet Commonly known as: LIPITOR TAKE 1 TABLET BY MOUTH EVERY DAY   diclofenac sodium 1 % Gel Commonly known as: VOLTAREN APP 2 TO 4 GRAMS AA BID PRN P.   escitalopram 10 MG tablet Commonly known as: LEXAPRO TAKE 1 AND 1/2 TABLETS DAILY BY MOUTH   Fish Oil 1360 MG Caps Take by mouth.   folic acid 322 MCG tablet Commonly known as: FOLVITE Take 400 mcg by mouth daily.   gabapentin 300 MG capsule Commonly known as: NEURONTIN Take 1 capsule (300 mg total) by mouth every other day.   Ibuprofen-Famotidine 800-26.6 MG Tabs Take by mouth as needed.   levothyroxine 150 MCG tablet Commonly known as: SYNTHROID TAKE 1 TABLET BY MOUTH EVERY DAY   Magnesium 500 MG Caps Take by mouth.   multivitamin with minerals tablet Take 1 tablet by mouth daily.   potassium gluconate 595 (99 K) MG Tabs tablet Take  595 mg by mouth.   sulfaSALAzine 500 MG tablet Commonly known as: AZULFIDINE Take 500 mg by mouth 3 (three) times daily.   triamcinolone cream 0.1 % Commonly known as: KENALOG Apply topically 2 (two) times daily as needed.   valACYclovir 500 MG tablet Commonly known as: VALTREX Take 500 mg by mouth 2 (two) times daily. As needed.        History (reviewed): Past Medical History:  Diagnosis Date   Arthritis    Cancer ([place])    right breast   Carpal tunnel syndrome    Chronic back pain    Complication of anesthesia    "stopped breathing during procedure and dizziness after procedures"   Depression    Hypercholesterolemia     Hypothyroidism    Osteopenia    Pneumonia    Ulcerative colitis ([place])    Past Surgical History:  Procedure Laterality Date   BILATERAL CARPAL TUNNEL RELEASE     BREAST BIOPSY Right 2011   In Situ DCIS   BREAST LUMPECTOMY Right 2011   in situ    BREAST SURGERY     biopsy and lumpectomy right breast   CHOLECYSTECTOMY     COLONOSCOPY W/ BIOPSIES AND POLYPECTOMY     JOINT REPLACEMENT     Total right hip   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   Procedure: LEFT TOTAL HIP ARTHROPLASTY;  Surgeon: Garald Balding, MD;  Location: [facility];  Service: Orthopedics;  Laterality: Left;   TRIGGER FINGER RELEASE     Family History  Problem Relation Age of Onset   Breast cancer Mother    Kidney failure Father    Heart Problems Father    Colon cancer Sister    Lung cancer Brother    Arthritis Other    Social History   Socioeconomic History   Marital status: Married    Spouse name: Harrell Gave   Number of children: 2   Years of education: 18   Highest education level: Master's degree (e.g., MA, MS, MEng, MEd, MSW, MBA)  Occupational History    Comment: Retired  Tobacco Use   Smoking status: Former    Types: Cigarettes    Quit date: 10/12/1979    Years since quitting: 42.0   Smokeless tobacco: Never   Tobacco comments:    quit smoking cigarettes 32 years ago  Vaping Use   Vaping Use: Never used  Substance and Sexual Activity   Alcohol use: Yes    Alcohol/week: 3.0 standard drinks of alcohol    Types: 3 Glasses of wine per week    Comment: social   Drug use: No   Sexual activity: Not on file  Other Topics Concern   Not on file  Social History Narrative   Lives with her husband. She enjoys playing with her dog and walking.   Social Determinants of Health   Financial Resource Strain: Low Risk  (11/06/2021)   Overall Financial Resource Strain (CARDIA)    Difficulty of Paying Living Expenses: Not hard at all  Food Insecurity: No Food Insecurity (11/06/2021)   Hunger Vital  Sign    Worried About Running Out of Food in the Last Year: Never true    Ran Out of Food in the Last Year: Never true  Transportation Needs: No Transportation Needs (11/06/2021)   PRAPARE - Hydrologist (Medical): No    Lack of Transportation (Non-Medical): No  Physical Activity: Insufficiently Active (11/06/2021)   Exercise Vital Sign  Days of Exercise per Week: 4 days    Minutes of Exercise per Session: 20 min  Stress: No Stress Concern Present (11/06/2021)   [place]    Feeling of Stress : Not at all  Social Connections: [place] (11/06/2021)   Social Connection and Isolation Panel [NHANES]    Frequency of Communication with Friends and Family: Three times a week    Frequency of Social Gatherings with Friends and Family: Never    Attends Religious Services: More than 4 times per year    Active Member of [employer] or Organizations: Yes    Attends Archivist Meetings: More than 4 times per year    Marital Status: Married    Activities of Daily Living    11/06/2021   10:06 AM  In your present state of health, do you have any difficulty performing the following activities:  Hearing? 0  Vision? 0  Difficulty concentrating or making decisions? 1  Comment some memory loss  Walking or climbing stairs? 1  Comment knee pain  Dressing or bathing? 0  Doing errands, shopping? 0  Preparing Food and eating ? N  Using the Toilet? N  In the past six months, have you accidently leaked urine? N  Do you have problems with loss of bowel control? N  Managing your Medications? N  Managing your Finances? N  Housekeeping or managing your Housekeeping? N    Patient Education/Literacy How often do you need to have someone help you when you read instructions, pamphlets, or other written materials from your doctor or pharmacy?: 1 - Never What is the last grade level you completed in  school?: Masters degree  Exercise Current Exercise Habits: Home exercise routine, Type of exercise: walking, Time (Minutes): 20, Frequency (Times/Week): 4, Weekly Exercise (Minutes/Week): 80, Intensity: Moderate, Exercise limited by: None identified  Diet Patient reports consuming 3 meals a day and 2 snack(s) a day Patient reports that her primary diet is: Regular Patient reports that she does have regular access to food.   Depression Screen    11/06/2021    9:59 AM 06/16/2021    2:01 PM 06/16/2021    1:30 PM 10/17/2020   10:21 AM 06/20/2020    9:59 AM 02/01/2020    9:08 AM 01/04/2020    8:58 AM  PHQ 2/9 Scores  PHQ - 2 Score 1 1 1  0 0 0 4  PHQ- 9 Score  2  0  5 5     Fall Risk    11/06/2021    9:57 AM 06/16/2021    1:30 PM 06/20/2020    9:58 AM 02/01/2020    9:04 AM 01/04/2020    8:58 AM  Fall Risk   Falls in the past year? 1 0 1 0 0  Number falls in past yr: 1 0 0 0 0  Injury with Fall? 0 0 1 0 0  Risk for fall due to : History of fall(s) No Fall Risks No Fall Risks No Fall Risks No Fall Risks  Follow up Falls evaluation completed;Education provided;Falls prevention discussed Falls evaluation completed Education provided;Falls evaluation completed;Falls prevention discussed Falls evaluation completed Falls evaluation completed     Objective:   BP 129/62 (BP Location: Left Arm, Patient Position: Sitting, Cuff Size: Normal)   Pulse 65   Ht 5' 2.5" (1.588 m)   Wt 147 lb 1.3 oz (66.7 kg)   SpO2 99%   BMI 26.47 kg/m   Last  Weight  Most recent update: 11/06/2021  9:52 AM    Weight  66.7 kg (147 lb 1.3 oz)             Body mass index is 26.47 kg/m.  Hearing/Vision  Bruchy did not have difficulty with hearing/understanding during the face-to-face interview Maleigha did not have difficulty with her vision during the face-to-face interview Reports that she has had a formal eye exam by an eye care professional within the past year Reports that she has not had a formal hearing  evaluation within the past year  Cognitive Function:    11/06/2021   10:09 AM 06/20/2020   10:11 AM  6CIT Screen  What Year? 0 points 0 points  What month? 0 points 0 points  What time? 0 points 0 points  Count back from 20 2 points 0 points  Months in reverse 0 points 0 points  Repeat phrase 2 points 0 points  Total Score 4 points 0 points    Normal Cognitive Function Screening: Yes (Normal:0-7, Significant for Dysfunction: >8)  Immunization & Health Maintenance Record Immunization History  Administered Date(s) Administered   Fluad Quad(high Dose 65+) 12/17/2018   Influenza Split 12/14/2004, 11/22/2011, 12/11/2012, 11/27/2013, 12/03/2016   Influenza, High Dose Seasonal PF 01/04/2016, 01/02/2018, 11/08/2018, 12/04/2019   Influenza-Unspecified 12/14/2004, 11/22/2011, 12/11/2012, 11/27/2013, 12/03/2016   Moderna Covid-19 Vaccine Bivalent Booster 21yr & up 01/27/2021   Moderna Sars-Covid-2 Vaccination 05/29/2019, 06/26/2019, 01/01/2020, 10/13/2020   PNEUMOCOCCAL CONJUGATE-20 11/06/2021   Pneumococcal Conjugate-13 08/13/2016   Pneumococcal Polysaccharide-23 01/02/2014   Rabies Immune Globulin 05/15/2018, 05/18/2018, 05/22/2018, 05/29/2018   Rabies, IM 05/15/2018, 05/18/2018, 05/22/2018, 05/29/2018   Tdap 12/16/2006, 06/09/2021   Zoster Recombinat (Shingrix) 08/13/2016, 12/21/2016   Zoster, Live 09/28/2010, 12/21/2016    Health Maintenance  Topic Date Due   COVID-19 Vaccine (6 - Moderna risk series) 11/22/2021 (Originally 03/24/2021)   INFLUENZA VACCINE  05/16/2022 (Originally 09/15/2021)   Hepatitis C Screening  11/07/2022 (Originally 12/12/1968)   MAMMOGRAM  02/14/2023   COLONOSCOPY (Pts 45-433yrInsurance coverage will need to be confirmed)  08/27/2023   TETANUS/TDAP  06/10/2031   Pneumonia Vaccine 6575Years old  Completed   DEXA SCAN  Completed   Zoster Vaccines- Shingrix  Completed   HPV VACCINES  Aged Out       Assessment  This is a routine wellness examination for  [employer] Health Maintenance: Due or Overdue There are no preventive care reminders to display for this patient.   JaKathrene Aluoes not need a referral for Community Assistance: Care Management:   no Social Work:    no Prescription Assistance:  no Nutrition/Diabetes Education:  no   Plan:  Personalized Goals  Goals Addressed               This Visit's Progress     Patient Stated (pt-stated)        11/06/2021 AWV Goal: Exercise for General Health  Patient will verbalize understanding of the benefits of increased physical activity: Exercising regularly is important. It will improve your overall fitness, flexibility, and endurance. Regular exercise also will improve your overall health. It can help you control your weight, reduce stress, and improve your bone density. Over the next year, patient will increase physical activity as tolerated with a goal of at least 150 minutes of moderate physical activity per week.  You can tell that you are exercising at a moderate intensity if your heart starts beating faster and you start breathing faster but  can still hold a conversation. Moderate-intensity exercise ideas include: Walking 1 mile (1.6 km) in about 15 minutes Biking Hiking Golfing Dancing Water aerobics Patient will verbalize understanding of everyday activities that increase physical activity by providing examples like the following: Yard work, such as: Sales promotion account executive Gardening Washing windows or floors Patient will be able to explain general safety guidelines for exercising:  Before you start a new exercise program, talk with your health care provider. Do not exercise so much that you hurt yourself, feel dizzy, or get very short of breath. Wear comfortable clothes and wear shoes with good support. Drink plenty of water while you exercise to prevent dehydration or heat stroke. Work  out until your breathing and your heartbeat get faster.        Personalized Health Maintenance & Screening Recommendations  Pneumococcal vaccine  Influenza vaccine Mammogram- due in December, 2023.  Lung Cancer Screening Recommended: no (Low Dose CT Chest recommended if Age 35-80 years, 30 pack-year currently smoking OR have quit w/in past 15 years) Hepatitis C Screening recommended: yes HIV Screening recommended: no  Advanced Directives: Written information was not given per the patient's request.  Referrals & Orders Orders Placed This Encounter  Procedures   Pneumococcal conjugate vaccine 20-valent (Prevnar 20)    Follow-up Plan Follow-up with [name], DO as planned Mammogram due in December, 2023. Medicare wellness visit in one year.  AVS printed and given to the patient.   I have personally reviewed and noted the following in the patient's chart:   Medical and social history Use of alcohol, tobacco or illicit drugs  Current medications and supplements Functional ability and status Nutritional status Physical activity Advanced directives List of other physicians Hospitalizations, surgeries, and ER visits in previous 12 months Vitals Screenings to include cognitive, depression, and falls Referrals and appointments  In addition, I have reviewed and discussed with patient certain preventive protocols, quality metrics, and best practice recommendations. A written personalized care plan for preventive services as well as general preventive health recommendations were provided to patient.     Tinnie Gens, RN BSN  11/06/2021

## 2021-11-09 ENCOUNTER — Ambulatory Visit: Payer: Medicare Other

## 2021-11-09 DIAGNOSIS — M170 Bilateral primary osteoarthritis of knee: Secondary | ICD-10-CM | POA: Diagnosis not present

## 2021-11-12 ENCOUNTER — Ambulatory Visit (INDEPENDENT_AMBULATORY_CARE_PROVIDER_SITE_OTHER): Payer: Medicare Other | Admitting: Family Medicine

## 2021-11-12 ENCOUNTER — Encounter: Payer: Self-pay | Admitting: Family Medicine

## 2021-11-12 VITALS — BP 146/76 | HR 65 | Ht 62.5 in | Wt 147.0 lb

## 2021-11-12 DIAGNOSIS — Z23 Encounter for immunization: Secondary | ICD-10-CM | POA: Diagnosis not present

## 2021-11-12 DIAGNOSIS — H8111 Benign paroxysmal vertigo, right ear: Secondary | ICD-10-CM | POA: Diagnosis not present

## 2021-11-12 MED ORDER — MECLIZINE HCL 25 MG PO TABS: 25.0000 mg | ORAL_TABLET | Freq: Three times a day (TID) | ORAL | 0 refills | Status: DC | PRN

## 2021-11-12 NOTE — Patient Instructions (Signed)
Benign Positional Vertigo Vertigo is the feeling that you or your surroundings are moving when they are not. Benign positional vertigo is the most common form of vertigo. This is usually a harmless condition (benign). This condition is positional. This means that symptoms are triggered by certain movements and positions. This condition can be dangerous if it occurs while you are doing something that could cause harm to yourself or others. This includes activities such as driving or operating machinery. What are the causes? The inner ear has fluid-filled canals that help your brain sense movement and balance. When the fluid moves, the brain receives messages about your body's position. With benign positional vertigo, calcium crystals in the inner ear break free and disturb the inner ear area. This causes your brain to receive confusing messages about your body's position. What increases the risk? You are more likely to develop this condition if: You are a woman. You are 72 years of age or older. You have recently had a head injury. You have an inner ear disease. What are the signs or symptoms? Symptoms of this condition usually happen when you move your head or your eyes in different directions. Symptoms may start suddenly and usually last for less than a minute. They include: Loss of balance and falling. Feeling like you are spinning or moving. Feeling like your surroundings are spinning or moving. Nausea and vomiting. Blurred vision. Dizziness. Involuntary eye movement (nystagmus). Symptoms can be mild and cause only minor problems, or they can be severe and interfere with daily life. Episodes of benign positional vertigo may return (recur) over time. Symptoms may also improve over time. How is this diagnosed? This condition may be diagnosed based on: Your medical history. A physical exam of the head, neck, and ears. Positional tests to check for or stimulate vertigo. You may be asked to  turn your head and change positions, such as going from sitting to lying down. A health care provider will watch for symptoms of vertigo. You may be referred to a health care provider who specializes in ear, nose, and throat problems (ENT or otolaryngologist) or a provider who specializes in disorders of the nervous system (neurologist). How is this treated?  This condition may be treated in a session in which your health care provider moves your head in specific positions to help the displaced crystals in your inner ear move. Treatment for this condition may take several sessions. Surgery may be needed in severe cases, but this is rare. In some cases, benign positional vertigo may resolve on its own in 2-4 weeks. Follow these instructions at home: Safety Move slowly. Avoid sudden body or head movements or certain positions, as told by your health care provider. Avoid driving or operating machinery until your health care provider says it is safe. Avoid doing any tasks that would be dangerous to you or others if vertigo occurs. If you have trouble walking or keeping your balance, try using a cane for stability. If you feel dizzy or unstable, sit down right away. Return to your normal activities as told by your health care provider. Ask your health care provider what activities are safe for you. General instructions Take over-the-counter and prescription medicines only as told by your health care provider. Drink enough fluid to keep your urine pale yellow. Keep all follow-up visits. This is important. Contact a health care provider if: You have a fever. Your condition gets worse or you develop new symptoms. Your family or friends notice any behavioral changes.  You have nausea or vomiting that gets worse. You have numbness or a prickling and tingling sensation. Get help right away if you: Have difficulty speaking or moving. Are always dizzy or faint. Develop severe headaches. Have weakness in  your legs or arms. Have changes in your hearing or vision. Develop a stiff neck. Develop sensitivity to light. These symptoms may represent a serious problem that is an emergency. Do not wait to see if the symptoms will go away. Get medical help right away. Call your local emergency services (911 in the [place] Do not drive yourself to the hospital. Summary Vertigo is the feeling that you or your surroundings are moving when they are not. Benign positional vertigo is the most common form of vertigo. This condition is caused by calcium crystals in the inner ear that become displaced. This causes a disturbance in an area of the inner ear that helps your brain sense movement and balance. Symptoms include loss of balance and falling, feeling that you or your surroundings are moving, nausea and vomiting, and blurred vision. This condition can be diagnosed based on symptoms, a physical exam, and positional tests. Follow safety instructions as told by your health care provider and keep all follow-up visits. This is important. This information is not intended to replace advice given to you by your health care provider. Make sure you discuss any questions you have with your health care provider. Document Revised: 01/02/2020 Document Reviewed: 01/02/2020 Elsevier Patient Education  [place].

## 2021-11-15 NOTE — Assessment & Plan Note (Signed)
Referral placed back to physical therapy for vestibular rehab as this worked quite well for her in the past.  Adding meclizine as needed for symptomatic control.

## 2021-11-15 NOTE — Progress Notes (Signed)
Stephanie Ramsey - 71 y.o. female MRN [id number]  Date of birth: 03-Mar-1950  Subjective Chief Complaint  Patient presents with   Hypertension    HPI Stephanie Ramsey is a 71 year old female here today with complaint of vertigo.  She has had vertigo in the past and symptoms at this time feel similar.  Symptoms worsen with sitting up too quickly or moving her head too quickly.  She has not had headaches, recent illness, fever or chills.  Denies any weakness, numbness or tingling in other extremities.  Had good success with vestibular rehab in the past.  ROS:  A comprehensive ROS was completed and negative except as noted per HPI  Allergies  Allergen Reactions   Oxycontin [Oxycodone] Swelling and Other (See Comments)    HYPOTENSION   Penicillins Rash and Other (See Comments)    Unknown  Unknown    Propofol     Past Medical History:  Diagnosis Date   Arthritis    Cancer ([place])    right breast   Carpal tunnel syndrome    Chronic back pain    Complication of anesthesia    "stopped breathing during procedure and dizziness after procedures"   Depression    Hypercholesterolemia    Hypothyroidism    Osteopenia    Pneumonia    Ulcerative colitis ([place])     Past Surgical History:  Procedure Laterality Date   BILATERAL CARPAL TUNNEL RELEASE     BREAST BIOPSY Right 2011   In Situ DCIS   BREAST LUMPECTOMY Right 2011   in situ    BREAST SURGERY     biopsy and lumpectomy right breast   CHOLECYSTECTOMY     COLONOSCOPY W/ BIOPSIES AND POLYPECTOMY     JOINT REPLACEMENT     Total right hip   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   Procedure: LEFT TOTAL HIP ARTHROPLASTY;  Surgeon: Garald Balding, MD;  Location: [facility];  Service: Orthopedics;  Laterality: Left;   TRIGGER FINGER RELEASE      Social History   Socioeconomic History   Marital status: Married    Spouse name: Harrell Gave   Number of children: 2   Years of education: 18   Highest education level: Master's degree (e.g., MA, MS,  MEng, MEd, MSW, MBA)  Occupational History    Comment: Retired  Tobacco Use   Smoking status: Former    Types: Cigarettes    Quit date: 10/12/1979    Years since quitting: 42.1   Smokeless tobacco: Never   Tobacco comments:    quit smoking cigarettes 32 years ago  Vaping Use   Vaping Use: Never used  Substance and Sexual Activity   Alcohol use: Yes    Alcohol/week: 3.0 standard drinks of alcohol    Types: 3 Glasses of wine per week    Comment: social   Drug use: No   Sexual activity: Not on file  Other Topics Concern   Not on file  Social History Narrative   Lives with her husband. She enjoys playing with her dog and walking.   Social Determinants of Health   Financial Resource Strain: Low Risk  (11/06/2021)   Overall Financial Resource Strain (CARDIA)    Difficulty of Paying Living Expenses: Not hard at all  Food Insecurity: No Food Insecurity (11/06/2021)   Hunger Vital Sign    Worried About Running Out of Food in the Last Year: Never true    Ran Out of Food in the Last Year: Never true  Transportation  Needs: No Transportation Needs (11/06/2021)   PRAPARE - Hydrologist (Medical): No    Lack of Transportation (Non-Medical): No  Physical Activity: Insufficiently Active (11/06/2021)   Exercise Vital Sign    Days of Exercise per Week: 4 days    Minutes of Exercise per Session: 20 min  Stress: No Stress Concern Present (11/06/2021)   [place]    Feeling of Stress : Not at all  Social Connections: [place] (11/06/2021)   Social Connection and Isolation Panel [NHANES]    Frequency of Communication with Friends and Family: Three times a week    Frequency of Social Gatherings with Friends and Family: Never    Attends Religious Services: More than 4 times per year    Active Member of Clubs or Organizations: Yes    Attends Music therapist: More than 4 times per  year    Marital Status: Married    Family History  Problem Relation Age of Onset   Breast cancer Mother    Kidney failure Father    Heart Problems Father    Colon cancer Sister    Lung cancer Brother    Arthritis Other     Health Maintenance  Topic Date Due   COVID-19 Vaccine (6 - Moderna risk series) 11/22/2021 (Originally 03/24/2021)   Hepatitis C Screening  11/07/2022 (Originally 12/12/1967)   MAMMOGRAM  02/14/2023   COLONOSCOPY (Pts 45-30yr Insurance coverage will need to be confirmed)  08/27/2023   TETANUS/TDAP  06/10/2031   Pneumonia Vaccine 71 Years old  Completed   INFLUENZA VACCINE  Completed   DEXA SCAN  Completed   Zoster Vaccines- Shingrix  Completed   HPV VACCINES  Aged Out     ----------------------------------------------------------------------------------------------------------------------------------------------------------------------------------------------------------------- Physical Exam BP (!) 146/76   Pulse 65   Ht 5' 2.5" (1.588 m)   Wt 147 lb (66.7 kg)   SpO2 98%   BMI 26.46 kg/m   Physical Exam Constitutional:      Appearance: Normal appearance.  Eyes:     General: No scleral icterus. Cardiovascular:     Rate and Rhythm: Normal rate and regular rhythm.  Musculoskeletal:     Cervical back: Neck supple.  Neurological:     General: No focal deficit present.     Mental Status: She is alert.     Cranial Nerves: No cranial nerve deficit.     Motor: No weakness.     Gait: Gait normal.     Comments: Positive Dix-Hallpike to the right.  Psychiatric:        Mood and Affect: Mood normal.        Behavior: Behavior normal.     ------------------------------------------------------------------------------------------------------------------------------------------------------------------------------------------------------------------- Assessment and Plan  Benign paroxysmal positional vertigo of right ear Referral placed back to physical  therapy for vestibular rehab as this worked quite well for her in the past.  Adding meclizine as needed for symptomatic control.   Meds ordered this encounter  Medications   meclizine (ANTIVERT) 25 MG tablet    Sig: Take 1 tablet (25 mg total) by mouth 3 (three) times daily as needed for dizziness.    Dispense:  30 tablet    Refill:  0    No follow-ups on file.    This visit occurred during the SARS-CoV-2 public health emergency.  Safety protocols were in place, including screening questions prior to the visit, additional usage of staff PPE, and extensive cleaning of exam room while  observing appropriate contact time as indicated for disinfecting solutions.

## 2021-11-17 ENCOUNTER — Encounter: Payer: Self-pay | Admitting: Physical Therapy

## 2021-11-17 ENCOUNTER — Other Ambulatory Visit: Payer: Self-pay

## 2021-11-17 ENCOUNTER — Ambulatory Visit: Payer: Medicare Other | Attending: Family Medicine | Admitting: Physical Therapy

## 2021-11-17 DIAGNOSIS — H8111 Benign paroxysmal vertigo, right ear: Secondary | ICD-10-CM | POA: Diagnosis not present

## 2021-11-17 NOTE — Therapy (Signed)
OUTPATIENT PHYSICAL THERAPY VESTIBULAR EVALUATION     Patient Name: Stephanie Ramsey MRN: [id number] DOB:07-18-1950, 71 y.o., female Today's Date: 11/17/2021   REFERRING PROVIDER: [name]   PT End of Session - 11/17/21 1418     Visit Number 1    Number of Visits 6    Date for PT Re-Evaluation 12/29/21    Authorization Type Medicare    Authorization - Visit Number 1    Progress Note Due on Visit 10    PT Start Time 3557    PT Stop Time 1225    PT Time Calculation (min) 40 min    Activity Tolerance Patient tolerated treatment well    Behavior During Therapy WFL for tasks assessed/performed             Past Medical History:  Diagnosis Date   Arthritis    Cancer ([place])    right breast   Carpal tunnel syndrome    Chronic back pain    Complication of anesthesia    "stopped breathing during procedure and dizziness after procedures"   Depression    Hypercholesterolemia    Hypothyroidism    Osteopenia    Pneumonia    Ulcerative colitis ([place])    Past Surgical History:  Procedure Laterality Date   BILATERAL CARPAL TUNNEL RELEASE     BREAST BIOPSY Right 2011   In Situ DCIS   BREAST LUMPECTOMY Right 2011   in situ    BREAST SURGERY     biopsy and lumpectomy right breast   CHOLECYSTECTOMY     COLONOSCOPY W/ BIOPSIES AND POLYPECTOMY     JOINT REPLACEMENT     Total right hip   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   Procedure: LEFT TOTAL HIP ARTHROPLASTY;  Surgeon: Garald Balding, MD;  Location: [facility];  Service: Orthopedics;  Laterality: Left;   TRIGGER FINGER RELEASE     Patient Active Problem List   Diagnosis Date Noted   Chronic intermittent hypoxia with obstructive sleep apnea 08/24/2021   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   Post-COVID chronic shortness of breath 08/05/2021   Non-restorative sleep 06/29/2021   Snoring 06/29/2021   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 06/29/2021   Depression, reactive 06/29/2021   Morning headache 06/16/2021   Essential tremor 06/16/2021   Rib pain on left side 01/12/2021   Left wrist pain 01/12/2021   Pleuritic chest pain 12/16/2020   Tenosynovitis of right wrist 12/03/2020   Abnormal prominence of clavicle 10/20/2020   Lumbar degenerative disc disease 12/10/2019   Benign paroxysmal positional vertigo of right ear 08/16/2019   Depression with anxiety 07/18/2019   Anxiety 08/17/2018   History of adenomatous polyp of colon 01/26/2018   Trigger ring finger of right hand 11/16/2017   Leg cramps 06/02/2016   Annual physical exam 08/11/2015   Hyperlipidemia 07/26/2015   Hypothyroidism 07/26/2015   Ulcerative colitis ([place]) 07/26/2015   Subcutaneous nodules 07/26/2015   Pain in joints 07/26/2015   Osteopenia 07/26/2015   Neutropenia ([place]) 07/26/2015   Neck muscle spasm 07/26/2015   Malignant neoplasm of breast ([place]) 07/26/2015   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   IFG (impaired fasting glucose) 07/26/2015   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 She states she feels it mostly when she gets up out of bed or when she lays down and turns head to the right. Dizziness lasts 15-20 seconds. Also feels dizzy if she is looking down or up for a prolonged period of time (5 minutes).  Pt accompanied by: self  PERTINENT HISTORY: BPPV last year treated at this clinic   PAIN:  Are you having pain? No  PRECAUTIONS: None  WEIGHT BEARING RESTRICTIONS No  FALLS: Has patient fallen in last 6 months?  No   PLOF: Independent  PATIENT GOALS : reduce dizziness  OBJECTIVE:    COGNITION: Overall cognitive status: Within functional limits for tasks assessed   SENSATION: WFL     VESTIBULAR ASSESSMENT       SYMPTOM BEHAVIOR:      Non-Vestibular symptoms:  pt states she has been hearing a "buzzing" in her Rt ear   Type of dizziness: "World moves"   Frequency: with position changes   Duration: 15-20 seconds   Aggravating factors: Induced by position change: supine to sit   Relieving factors: slow movements   Progression of symptoms: worse   OCULOMOTOR EXAM:   Ocular Alignment: normal   Ocular ROM: No Limitations   Spontaneous Nystagmus: absent   Gaze-Induced Nystagmus: absent   Smooth Pursuits: intact   Saccades: intact       VESTIBULAR - OCULAR REFLEX:    Slow VOR: Normal   VOR Cancellation: Normal   Head-Impulse Test: HIT Right: positive HIT Left: negative       POSITIONAL TESTING: Right Dix-Hallpike: no nystagmus Left Dix-Hallpike: no nystagmus Right Roll Test: no nystagmus Left Roll Test: no nystagmus    MOTION SENSITIVITY:    Motion Sensitivity Quotient  Intensity: 0 = none, 1 = Lightheaded, 2 = Mild, 3 = Moderate, 4 = Severe, 5 = Vomiting  Intensity  1. Sitting to supine   2. Supine to L side   3. Supine to R side   4. Supine to sitting   5. L Hallpike-Dix   6. Up from L  2  7. R Hallpike-Dix   8. Up from R    9. Sitting, head  tipped to L knee   10. Head up from L  knee   11. Sitting, head  tipped to R knee   12. Head up from R  knee   13. Sitting head turns x5   14.Sitting head nods x5   15. In stance, 180  turn to L    16. In stance, 180  turn to R       VESTIBULAR TREATMENT:  [place] Repositioning:   Epley Right: Number of Reps: 1 and Comment: for HEP, no symptoms  Habituation:   Other: LT sidelying to sit with multiple pillows  PATIENT EDUCATION: Education details: PT POC and goals, HEP Person educated:  Patient Education method: Explanation, Demonstration, and Handouts Education comprehension: verbalized understanding and returned demonstration  Access Code: [email or web address] URL: [email or web address] Date: 11/17/2021 Prepared by: Isabelle Course  Exercises - Self-Epley Maneuver Right Ear  - 1 x daily - 7 x weekly - 1 sets - 3 reps - Sit to Sidelying with Prop on Forearm  - 1 x daily - 7 x weekly - 1 sets - 10 reps   GOALS: Goals reviewed with patient? Yes  LONG TERM GOALS: Target date: 12/29/2021    Pt will be independent with HEP to manage symptoms Baseline:  Goal status: INITIAL  2.  Pt will report symptoms of dizziness <= 1 out of 7 days Baseline:  Goal status: INITIAL    ASSESSMENT:  CLINICAL IMPRESSION: Patient is a 71 y.o. female who was seen today for physical therapy evaluation and treatment for BPPV. Pt presents with motion sensitivity, no positional symptoms at time of eval. Pt was given HEP to address BPPV if it occurs and to improve motion sensitivity. She will benefit from skilled PT to address deficits and decrease occurrence of dizziness.    OBJECTIVE IMPAIRMENTS dizziness.   ACTIVITY LIMITATIONS transfers  PARTICIPATION LIMITATIONS: cleaning  PERSONAL FACTORS Past/current experiences are also affecting patient's functional outcome.   REHAB POTENTIAL: Good  CLINICAL DECISION MAKING: Evolving/moderate complexity  EVALUATION COMPLEXITY: Moderate   PLAN: PT FREQUENCY: 1x/week  PT DURATION: 6 weeks  PLANNED INTERVENTIONS: Therapeutic exercises, Therapeutic activity, Neuromuscular re-education, Balance training, Gait training, Patient/Family education, Self Care, Joint mobilization, Vestibular training, Manual therapy, and Re-evaluation  PLAN FOR NEXT SESSION: assess response to HEP, habituation as tolerated   Kendelle Schweers, PT 11/17/2021, 2:19 PM

## 2021-11-24 DIAGNOSIS — G4733 Obstructive sleep apnea (adult) (pediatric): Secondary | ICD-10-CM | POA: Diagnosis not present

## 2021-11-27 DIAGNOSIS — G4733 Obstructive sleep apnea (adult) (pediatric): Secondary | ICD-10-CM | POA: Diagnosis not present

## 2021-12-09 ENCOUNTER — Other Ambulatory Visit: Payer: Self-pay | Admitting: Family Medicine

## 2021-12-09 DIAGNOSIS — M62838 Other muscle spasm: Secondary | ICD-10-CM

## 2021-12-09 DIAGNOSIS — M2550 Pain in unspecified joint: Secondary | ICD-10-CM

## 2021-12-24 ENCOUNTER — Ambulatory Visit (INDEPENDENT_AMBULATORY_CARE_PROVIDER_SITE_OTHER): Payer: Medicare Other

## 2021-12-24 ENCOUNTER — Encounter: Payer: Self-pay | Admitting: Family Medicine

## 2021-12-24 ENCOUNTER — Ambulatory Visit (INDEPENDENT_AMBULATORY_CARE_PROVIDER_SITE_OTHER): Payer: Medicare Other | Admitting: Family Medicine

## 2021-12-24 VITALS — BP 132/80 | HR 72 | Ht 62.5 in | Wt 147.0 lb

## 2021-12-24 DIAGNOSIS — M25511 Pain in right shoulder: Secondary | ICD-10-CM

## 2021-12-24 DIAGNOSIS — R0781 Pleurodynia: Secondary | ICD-10-CM | POA: Diagnosis not present

## 2021-12-24 DIAGNOSIS — Z23 Encounter for immunization: Secondary | ICD-10-CM | POA: Diagnosis not present

## 2021-12-24 DIAGNOSIS — S299XXA Unspecified injury of thorax, initial encounter: Secondary | ICD-10-CM | POA: Diagnosis not present

## 2021-12-24 DIAGNOSIS — S4991XA Unspecified injury of right shoulder and upper arm, initial encounter: Secondary | ICD-10-CM | POA: Diagnosis not present

## 2021-12-24 NOTE — Assessment & Plan Note (Signed)
Xrays ordered with R rib detail ordered.

## 2021-12-24 NOTE — Assessment & Plan Note (Signed)
Previous rotator cuff injury.  Mild weakness with supraspinatus testing, likely related to prior injury.  Will obtain updated xrays due to current injury.

## 2021-12-24 NOTE — Progress Notes (Signed)
Stephanie Ramsey - 71 y.o. female MRN [id number]  Date of birth: [birth year]  Subjective Chief Complaint  Patient presents with   Fall   Rib Injury    HPI Stephanie Ramsey is a 71 y.o. female here today with complaint of rib pain.  Yesterday she reports she was reaching for a dog dish in the back seat of her vehicle.  There was a hard plastic water bottle that she "slammed" in to the water bottle injuring her ribs.  She did not hear any popping or cracking sensation.  The pain is a little better today.  She denies shortness of breath but it is painful to take a deep breath.    Additionally, she fell from a ladder a few days ago as well and injured her R shoulder.  The actual fall didn't really injure her but something from the shelf fell on her shoulder.  She still has good range of motion but sith some pain.  She does have previous rotator cuff tear that was not repaired.    ROS:  A comprehensive ROS was completed and negative except as noted per HPI  Allergies  Allergen Reactions   Oxycontin [Oxycodone] Swelling and Other (See Comments)    HYPOTENSION   Penicillins Rash and Other (See Comments)    Unknown  Unknown    Propofol     Past Medical History:  Diagnosis Date   Arthritis    Cancer ([place])    right breast   Carpal tunnel syndrome    Chronic back pain    Complication of anesthesia    "stopped breathing during procedure and dizziness after procedures"   Depression    Hypercholesterolemia    Hypothyroidism    Osteopenia    Pneumonia    Ulcerative colitis ([place])     Past Surgical History:  Procedure Laterality Date   BILATERAL CARPAL TUNNEL RELEASE     BREAST BIOPSY Right 2011   In Situ DCIS   BREAST LUMPECTOMY Right 2011   in situ    BREAST SURGERY     biopsy and lumpectomy right breast   CHOLECYSTECTOMY     COLONOSCOPY W/ BIOPSIES AND POLYPECTOMY     JOINT REPLACEMENT     Total right hip   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   Procedure: LEFT TOTAL HIP  ARTHROPLASTY;  Surgeon: Garald Balding, MD;  Location: [facility];  Service: Orthopedics;  Laterality: Left;   TRIGGER FINGER RELEASE      Social History   Socioeconomic History   Marital status: Married    Spouse name: Harrell Gave   Number of children: 2   Years of education: 18   Highest education level: Master's degree (e.g., MA, MS, MEng, MEd, MSW, MBA)  Occupational History    Comment: Retired  Tobacco Use   Smoking status: Former    Types: Cigarettes    Quit date: 10/12/1979    Years since quitting: 42.2   Smokeless tobacco: Never   Tobacco comments:    quit smoking cigarettes 32 years ago  Vaping Use   Vaping Use: Never used  Substance and Sexual Activity   Alcohol use: Yes    Alcohol/week: 3.0 standard drinks of alcohol    Types: 3 Glasses of wine per week    Comment: social   Drug use: No   Sexual activity: Not on file  Other Topics Concern   Not on file  Social History Narrative   Lives with her husband. She enjoys playing  with her dog and walking.   Social Determinants of Health   Financial Resource Strain: Low Risk  (11/06/2021)   Overall Financial Resource Strain (CARDIA)    Difficulty of Paying Living Expenses: Not hard at all  Food Insecurity: No Food Insecurity (11/06/2021)   Hunger Vital Sign    Worried About Running Out of Food in the Last Year: Never true    Ran Out of Food in the Last Year: Never true  Transportation Needs: No Transportation Needs (11/06/2021)   PRAPARE - Hydrologist (Medical): No    Lack of Transportation (Non-Medical): No  Physical Activity: Insufficiently Active (11/06/2021)   Exercise Vital Sign    Days of Exercise per Week: 4 days    Minutes of Exercise per Session: 20 min  Stress: No Stress Concern Present (11/06/2021)   [place]    Feeling of Stress : Not at all  Social Connections: [place] (11/06/2021)   Social  Connection and Isolation Panel [NHANES]    Frequency of Communication with Friends and Family: Three times a week    Frequency of Social Gatherings with Friends and Family: Never    Attends Religious Services: More than 4 times per year    Active Member of Clubs or Organizations: Yes    Attends Music therapist: More than 4 times per year    Marital Status: Married    Family History  Problem Relation Age of Onset   Breast cancer Mother    Kidney failure Father    Heart Problems Father    Colon cancer Sister    Lung cancer Brother    Arthritis Other     Health Maintenance  Topic Date Due   COVID-19 Vaccine (6 - Moderna risk series) 03/18/2022 (Originally 03/24/2021)   Hepatitis C Screening  11/07/2022 (Originally 12/12/1967)   Medicare Annual Wellness ([place])  11/07/2022   MAMMOGRAM  02/14/2023   COLONOSCOPY (Pts 45-49yr Insurance coverage will need to be confirmed)  08/27/2023   TETANUS/TDAP  06/10/2031   Pneumonia Vaccine 71 Years old  Completed   INFLUENZA VACCINE  Completed   DEXA SCAN  Completed   Zoster Vaccines- Shingrix  Completed   HPV VACCINES  Aged Out     ----------------------------------------------------------------------------------------------------------------------------------------------------------------------------------------------------------------- Physical Exam BP 132/80 (BP Location: Left Arm, Patient Position: Sitting, Cuff Size: Normal)   Pulse 72   Ht 5' 2.5" (1.588 m)   Wt 147 lb (66.7 kg)   SpO2 97%   BMI 26.46 kg/m   Physical Exam Constitutional:      Appearance: Normal appearance.  HENT:     Head: Normocephalic and atraumatic.  Eyes:     General: No scleral icterus. Cardiovascular:     Rate and Rhythm: Normal rate and regular rhythm.  Pulmonary:     Effort: Pulmonary effort is normal.     Breath sounds: Normal breath sounds.  Musculoskeletal:     Cervical back: Neck supple.  Neurological:     Mental Status: She is  alert.  Psychiatric:        Mood and Affect: Mood normal.        Behavior: Behavior normal.     ------------------------------------------------------------------------------------------------------------------------------------------------------------------------------------------------------------------- Assessment and Plan  Rib pain on right side Xrays ordered with R rib detail ordered.   Acute pain of right shoulder Previous rotator cuff injury.  Mild weakness with supraspinatus testing, likely related to prior injury.  Will obtain updated xrays due to  current injury.    No orders of the defined types were placed in this encounter.   No follow-ups on file.    This visit occurred during the SARS-CoV-2 public health emergency.  Safety protocols were in place, including screening questions prior to the visit, additional usage of staff PPE, and extensive cleaning of exam room while observing appropriate contact time as indicated for disinfecting solutions.

## 2021-12-24 NOTE — Addendum Note (Signed)
Addended by: Peggye Ley on: 12/24/2021 10:43 AM   Modules accepted: Orders

## 2022-01-14 DIAGNOSIS — H2513 Age-related nuclear cataract, bilateral: Secondary | ICD-10-CM | POA: Diagnosis not present

## 2022-01-14 DIAGNOSIS — H35341 Macular cyst, hole, or pseudohole, right eye: Secondary | ICD-10-CM | POA: Diagnosis not present

## 2022-01-14 DIAGNOSIS — H25013 Cortical age-related cataract, bilateral: Secondary | ICD-10-CM | POA: Diagnosis not present

## 2022-01-18 ENCOUNTER — Other Ambulatory Visit: Payer: Self-pay | Admitting: Family Medicine

## 2022-01-18 DIAGNOSIS — Z1231 Encounter for screening mammogram for malignant neoplasm of breast: Secondary | ICD-10-CM

## 2022-03-11 DIAGNOSIS — G4733 Obstructive sleep apnea (adult) (pediatric): Secondary | ICD-10-CM | POA: Diagnosis not present

## 2022-03-16 ENCOUNTER — Ambulatory Visit
Admission: RE | Admit: 2022-03-16 | Discharge: 2022-03-16 | Disposition: A | Discharge status: Home / Self Care | Payer: Medicare Other | Source: Ambulatory Visit | Attending: Family Medicine | Admitting: Family Medicine

## 2022-03-16 DIAGNOSIS — Z1231 Encounter for screening mammogram for malignant neoplasm of breast: Secondary | ICD-10-CM | POA: Diagnosis not present

## 2022-04-27 DIAGNOSIS — M79642 Pain in left hand: Secondary | ICD-10-CM | POA: Diagnosis not present

## 2022-04-27 DIAGNOSIS — M19042 Primary osteoarthritis, left hand: Secondary | ICD-10-CM | POA: Diagnosis not present

## 2022-04-27 DIAGNOSIS — M1812 Unilateral primary osteoarthritis of first carpometacarpal joint, left hand: Secondary | ICD-10-CM | POA: Diagnosis not present

## 2022-04-29 DIAGNOSIS — H52223 Regular astigmatism, bilateral: Secondary | ICD-10-CM | POA: Diagnosis not present

## 2022-05-03 ENCOUNTER — Other Ambulatory Visit: Payer: Self-pay | Admitting: Family Medicine

## 2022-05-04 ENCOUNTER — Ambulatory Visit (INDEPENDENT_AMBULATORY_CARE_PROVIDER_SITE_OTHER): Payer: Medicare Other | Admitting: Family Medicine

## 2022-05-04 ENCOUNTER — Ambulatory Visit (INDEPENDENT_AMBULATORY_CARE_PROVIDER_SITE_OTHER): Payer: Medicare Other

## 2022-05-04 ENCOUNTER — Encounter: Payer: Self-pay | Admitting: Family Medicine

## 2022-05-04 VITALS — BP 143/62 | HR 75 | Ht 62.0 in | Wt 150.0 lb

## 2022-05-04 DIAGNOSIS — M542 Cervicalgia: Secondary | ICD-10-CM

## 2022-05-04 DIAGNOSIS — W19XXXA Unspecified fall, initial encounter: Secondary | ICD-10-CM | POA: Diagnosis not present

## 2022-05-04 DIAGNOSIS — M4802 Spinal stenosis, cervical region: Secondary | ICD-10-CM | POA: Diagnosis not present

## 2022-05-04 DIAGNOSIS — M19011 Primary osteoarthritis, right shoulder: Secondary | ICD-10-CM | POA: Diagnosis not present

## 2022-05-04 DIAGNOSIS — M50323 Other cervical disc degeneration at C6-C7 level: Secondary | ICD-10-CM | POA: Diagnosis not present

## 2022-05-04 NOTE — Assessment & Plan Note (Signed)
-   pt presents for evaluation after a fall 4 days ago. Neuro exam normal. Msk exams shows pain to palpation of right clavicle as well as paraspinal muscles of c spine. Xrays ordered to look for fracture

## 2022-05-04 NOTE — Progress Notes (Signed)
Acute Office Visit  Subjective:     Patient ID: Stephanie Ramsey, female    DOB: 1950/11/03, 72 y.o.   MRN: [id number]  Chief Complaint  Patient presents with   Fall    Pt present with pain after a fall off the toilet on Sunday early morning, pt state her neck and collar bone and head pain     HPI Patient is in today for fall that occurred on 3/16. She admits to neck pain and collar bone pain. She had bad leg cramps that caused her pain. She usually takes a gabapentin for it. She was having a leg cramp in her opposite leg while going to the toilet. She had a episode of heavy breathing and sweating and then passed out. She fell off the toilet onto hardwood floor. She was on her side and then tried to shimmy down and get up with her legs. The next day she was fine and she went to the [facility]. Two days later she had a headache and some neck pain that got progressively worse. She has a large bruise on her right arm. She also noticed the right side of her collarbone was painful.   Review of Systems  Constitutional:  Negative for chills and fever.  Respiratory:  Negative for cough and shortness of breath.   Cardiovascular:  Negative for chest pain.  Musculoskeletal:  Positive for neck pain.       Right collar bone pain  Neurological:  Negative for headaches.        Objective:    BP (!) 143/62   Pulse 75   Ht 5\' 2"  (1.575 m)   Wt 150 lb (68 kg)   SpO2 99%   BMI 27.44 kg/m    Physical Exam Vitals and nursing note reviewed.  Constitutional:      General: She is not in acute distress.    Appearance: Normal appearance.  HENT:     Head: Normocephalic and atraumatic.     Right Ear: External ear normal.     Left Ear: External ear normal.     Nose: Nose normal.  Eyes:     Conjunctiva/sclera: Conjunctivae normal.  Cardiovascular:     Rate and Rhythm: Normal rate and regular rhythm.  Pulmonary:     Effort: Pulmonary effort is normal.     Breath sounds: Normal breath sounds.   Musculoskeletal:     Comments: Non-tender to palpation of C, T, and L spine Tenderness to palpation of paraspinal muscles of cervical spine Tenderness to palpation of right clavicle closes to midline   Neurological:     General: No focal deficit present.     Mental Status: She is alert and oriented to person, place, and time.     Cranial Nerves: No cranial nerve deficit.     Motor: No weakness.     Gait: Gait normal.  Psychiatric:        Mood and Affect: Mood normal.        Behavior: Behavior normal.        Thought Content: Thought content normal.        Judgment: Judgment normal.    No results found for any visits on 05/04/22.     Assessment & Plan:   Problem List Items Addressed This Visit       Other   Fall - Primary    - pt presents for evaluation after a fall 4 days ago. Neuro exam normal. Msk exams shows pain to  palpation of right clavicle as well as paraspinal muscles of c spine. Xrays ordered to look for fracture      Relevant Orders   DG Cervical Spine Complete   DG Clavicle Right   DG Cervical Spine Complete   DG Clavicle Right   Neck pain    - physical exam shows paraspinal muscle pain -xr c spine ordered stat to rule out neck fracture - neurological exam intact       Relevant Orders   DG Cervical Spine Complete   DG Clavicle Right   DG Cervical Spine Complete   DG Clavicle Right    No orders of the defined types were placed in this encounter.   Return if symptoms worsen or fail to improve.  Owens Loffler, DO

## 2022-05-04 NOTE — Assessment & Plan Note (Signed)
-   physical exam shows paraspinal muscle pain -xr c spine ordered stat to rule out neck fracture - neurological exam intact

## 2022-05-07 DIAGNOSIS — H524 Presbyopia: Secondary | ICD-10-CM | POA: Diagnosis not present

## 2022-05-10 DIAGNOSIS — M170 Bilateral primary osteoarthritis of knee: Secondary | ICD-10-CM | POA: Diagnosis not present

## 2022-05-10 DIAGNOSIS — M25562 Pain in left knee: Secondary | ICD-10-CM | POA: Diagnosis not present

## 2022-05-10 DIAGNOSIS — M25561 Pain in right knee: Secondary | ICD-10-CM | POA: Diagnosis not present

## 2022-05-20 ENCOUNTER — Encounter: Payer: Self-pay | Admitting: Family Medicine

## 2022-05-20 ENCOUNTER — Other Ambulatory Visit: Payer: Self-pay | Admitting: Family Medicine

## 2022-05-20 DIAGNOSIS — M62838 Other muscle spasm: Secondary | ICD-10-CM

## 2022-05-20 DIAGNOSIS — M2550 Pain in unspecified joint: Secondary | ICD-10-CM

## 2022-05-20 MED ORDER — GABAPENTIN 300 MG PO CAPS: 300.0000 mg | ORAL_CAPSULE | Freq: Every day | ORAL | 3 refills | Status: DC | PRN

## 2022-06-14 DIAGNOSIS — G4733 Obstructive sleep apnea (adult) (pediatric): Secondary | ICD-10-CM | POA: Diagnosis not present

## 2022-06-22 DIAGNOSIS — K080 Exfoliation of teeth due to systemic causes: Secondary | ICD-10-CM | POA: Diagnosis not present

## 2022-06-23 ENCOUNTER — Other Ambulatory Visit: Payer: Self-pay | Admitting: Family Medicine

## 2022-07-15 DIAGNOSIS — H43821 Vitreomacular adhesion, right eye: Secondary | ICD-10-CM | POA: Diagnosis not present

## 2022-07-15 DIAGNOSIS — H35341 Macular cyst, hole, or pseudohole, right eye: Secondary | ICD-10-CM | POA: Diagnosis not present

## 2022-07-15 DIAGNOSIS — H2513 Age-related nuclear cataract, bilateral: Secondary | ICD-10-CM | POA: Diagnosis not present

## 2022-07-28 DIAGNOSIS — D1801 Hemangioma of skin and subcutaneous tissue: Secondary | ICD-10-CM | POA: Diagnosis not present

## 2022-07-28 DIAGNOSIS — L821 Other seborrheic keratosis: Secondary | ICD-10-CM | POA: Diagnosis not present

## 2022-07-28 DIAGNOSIS — L570 Actinic keratosis: Secondary | ICD-10-CM | POA: Diagnosis not present

## 2022-07-28 DIAGNOSIS — L814 Other melanin hyperpigmentation: Secondary | ICD-10-CM | POA: Diagnosis not present

## 2022-07-28 DIAGNOSIS — L578 Other skin changes due to chronic exposure to nonionizing radiation: Secondary | ICD-10-CM | POA: Diagnosis not present

## 2022-07-29 ENCOUNTER — Other Ambulatory Visit: Payer: Self-pay | Admitting: Family Medicine

## 2022-08-17 DIAGNOSIS — K51919 Ulcerative colitis, unspecified with unspecified complications: Secondary | ICD-10-CM | POA: Diagnosis not present

## 2022-09-20 ENCOUNTER — Other Ambulatory Visit: Payer: Self-pay | Admitting: Family Medicine

## 2022-09-28 ENCOUNTER — Encounter: Payer: Self-pay | Admitting: Family Medicine

## 2022-09-28 ENCOUNTER — Ambulatory Visit (INDEPENDENT_AMBULATORY_CARE_PROVIDER_SITE_OTHER): Payer: Medicare Other | Admitting: Family Medicine

## 2022-09-28 ENCOUNTER — Ambulatory Visit (INDEPENDENT_AMBULATORY_CARE_PROVIDER_SITE_OTHER): Payer: Medicare Other

## 2022-09-28 VITALS — BP 150/70 | HR 52 | Ht 62.0 in | Wt 147.0 lb

## 2022-09-28 DIAGNOSIS — I7300 Raynaud's syndrome without gangrene: Secondary | ICD-10-CM | POA: Diagnosis not present

## 2022-09-28 DIAGNOSIS — M7732 Calcaneal spur, left foot: Secondary | ICD-10-CM | POA: Diagnosis not present

## 2022-09-28 DIAGNOSIS — M2550 Pain in unspecified joint: Secondary | ICD-10-CM

## 2022-09-28 DIAGNOSIS — M79672 Pain in left foot: Secondary | ICD-10-CM | POA: Diagnosis not present

## 2022-09-28 DIAGNOSIS — R030 Elevated blood-pressure reading, without diagnosis of hypertension: Secondary | ICD-10-CM

## 2022-09-28 DIAGNOSIS — E039 Hypothyroidism, unspecified: Secondary | ICD-10-CM | POA: Diagnosis not present

## 2022-09-28 NOTE — Progress Notes (Signed)
Stephanie Ramsey - 72 y.o. female MRN [id number]  Date of birth: [birth year]  Subjective Chief Complaint  Patient presents with   Numbness   Foot Pain    HPI Stephanie Ramsey is a 72 y.o. female here today with complaint of discoloration of her ring finger of her L hand.  She reports that the finger turned white and she had some decreased sensation for a couple of hours.  Has not re-occurred. Denies pain associated with this.  Hands were cold at the time.  She did not notice any swelling of the joint and denies recent injury.   She has also had some pain in the great toe of the Right foot.  She had several mosquto bites while traveling in [place] recently and questions this as a possible cause but reports that this started prior to the bites.  She does get some red/hot feeling in the toe sometimes.   ROS:  A comprehensive ROS was completed and negative except as noted per HPI  Allergies  Allergen Reactions   Oxycontin [Oxycodone] Swelling and Other (See Comments)    HYPOTENSION   Penicillins Rash and Other (See Comments)    Unknown  Unknown    Propofol     Past Medical History:  Diagnosis Date   Arthritis    Cancer (HCC)    right breast   Carpal tunnel syndrome    Chronic back pain    Complication of anesthesia    "stopped breathing during procedure and dizziness after procedures"   Depression    Hypercholesterolemia    Hypothyroidism    Osteopenia    Pneumonia    Ulcerative colitis (HCC)     Past Surgical History:  Procedure Laterality Date   BILATERAL CARPAL TUNNEL RELEASE     BREAST BIOPSY Right 2011   In Situ DCIS   BREAST LUMPECTOMY Right 2011   in situ    BREAST SURGERY     biopsy and lumpectomy right breast   CHOLECYSTECTOMY     COLONOSCOPY W/ BIOPSIES AND POLYPECTOMY     JOINT REPLACEMENT     Total right hip   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   Procedure: LEFT TOTAL HIP ARTHROPLASTY;  Surgeon: Valeria Batman, MD;  Location: [facility] OR;  Service:  Orthopedics;  Laterality: Left;   TRIGGER FINGER RELEASE      Social History   Socioeconomic History   Marital status: Married    Spouse name: Cristal Deer   Number of children: 2   Years of education: 18   Highest education level: Master's degree (e.g., MA, MS, MEng, MEd, MSW, MBA)  Occupational History    Comment: Retired  Tobacco Use   Smoking status: Former    Current packs/day: 0.00    Types: Cigarettes    Quit date: 10/12/1979    Years since quitting: 42.9   Smokeless tobacco: Never   Tobacco comments:    quit smoking cigarettes 32 years ago  Vaping Use   Vaping status: Never Used  Substance and Sexual Activity   Alcohol use: Yes    Alcohol/week: 3.0 standard drinks of alcohol    Types: 3 Glasses of wine per week    Comment: social   Drug use: No   Sexual activity: Not on file  Other Topics Concern   Not on file  Social History Narrative   Lives with her husband. She enjoys playing with her dog and walking.   Social Determinants of Health   Financial Resource Strain:  Low Risk  (11/06/2021)   Overall Financial Resource Strain (CARDIA)    Difficulty of Paying Living Expenses: Not hard at all  Food Insecurity: No Food Insecurity (11/06/2021)   Hunger Vital Sign    Worried About Running Out of Food in the Last Year: Never true    Ran Out of Food in the Last Year: Never true  Transportation Needs: No Transportation Needs (11/06/2021)   PRAPARE - Administrator, Civil Service (Medical): No    Lack of Transportation (Non-Medical): No  Physical Activity: Insufficiently Active (11/06/2021)   Exercise Vital Sign    Days of Exercise per Week: 4 days    Minutes of Exercise per Session: 20 min  Stress: No Stress Concern Present (11/06/2021)   [employer] of Occupational Health - Occupational Stress Questionnaire    Feeling of Stress : Not at all  Social Connections: Socially Integrated (11/06/2021)   Social Connection and Isolation Panel [NHANES]     Frequency of Communication with Friends and Family: Three times a week    Frequency of Social Gatherings with Friends and Family: Never    Attends Religious Services: More than 4 times per year    Active Member of Clubs or Organizations: Yes    Attends Engineer, structural: More than 4 times per year    Marital Status: Married    Family History  Problem Relation Age of Onset   Breast cancer Mother    Kidney failure Father    Heart Problems Father    Colon cancer Sister    Lung cancer Brother    Arthritis Other     Health Maintenance  Topic Date Due   COVID-19 Vaccine (7 - 2023-24 season) 02/18/2022   INFLUENZA VACCINE  09/16/2022   Medicare Annual Wellness (AWV)  11/07/2022   Hepatitis C Screening  11/07/2022 (Originally 12/12/1968)   Colonoscopy  08/27/2023   MAMMOGRAM  03/16/2024   DTaP/Tdap/Td (4 - Td or Tdap) 06/10/2031   Pneumonia Vaccine 30+ Years old  Completed   DEXA SCAN  Completed   Zoster Vaccines- Shingrix  Completed   HPV VACCINES  Aged Out     ----------------------------------------------------------------------------------------------------------------------------------------------------------------------------------------------------------------- Physical Exam BP (!) 150/70 (BP Location: Left Arm, Patient Position: Sitting, Cuff Size: Normal)   Pulse (!) 52   Ht 5\' 2"  (1.575 m)   Wt 147 lb (66.7 kg)   SpO2 98%   BMI 26.89 kg/m   Physical Exam HENT:     Head: Normocephalic and atraumatic.  Eyes:     General: No scleral icterus. Cardiovascular:     Rate and Rhythm: Normal rate and regular rhythm.  Pulmonary:     Effort: Pulmonary effort is normal.     Breath sounds: Normal breath sounds.  Musculoskeletal:     Comments: Hands and fingers are well perfused.  Allen test is normal.    MTP joint with enlargement.  TTP without erythema or warmth.        ------------------------------------------------------------------------------------------------------------------------------------------------------------------------------------------------------------------- Assessment and Plan  Raynaud's disease without gangrene Her symptoms are consistent with Raynaud's.  She has a history of ulcerative colitis we discussed that symptoms may be related to autoimmune disease.  Checking inflammatory markers today.  Hypothyroidism Update TSH.  Left foot pain Favor osteoarthritis however will check uric acid and obtain x-rays.   No orders of the defined types were placed in this encounter.   No follow-ups on file.    This visit occurred during the SARS-CoV-2 public health emergency.  Safety protocols  were in place, including screening questions prior to the visit, additional usage of staff PPE, and extensive cleaning of exam room while observing appropriate contact time as indicated for disinfecting solutions.

## 2022-09-28 NOTE — Patient Instructions (Signed)
Raynaud's Phenomenon  Raynaud's phenomenon is a condition that affects the blood vessels (arteries) that carry blood to the fingers and toes. The arteries that supply blood to the ears, lips, nipples, or the tip of the nose might also be affected. Raynaud's phenomenon causes the arteries to become narrow temporarily (spasm). As a result, the flow of blood to the affected areas is temporarily decreased. This usually occurs in response to cold temperatures or stress. During an attack, the skin in the affected areas turns white, then blue, and finally red. A person may also feel tingling or numbness in those areas. Attacks usually last for only a brief period, and then the blood flow to the area returns to normal. In most cases, Raynaud's phenomenon does not cause serious health problems. What are the causes? In many cases, the cause of this condition is not known. The condition may occur on its own (primary Raynaud's phenomenon) or may be associated with other diseases or factors (secondary Raynaud's phenomenon). Possible causes may include: Diseases or medical conditions that damage the arteries. Injuries and repetitive actions that hurt the hands or feet. Being exposed to certain chemicals. Taking medicines that narrow the arteries. Other medical conditions, such as lupus, scleroderma, rheumatoid arthritis, thyroid problems, blood disorders, Sjogren syndrome, or atherosclerosis. What increases the risk? The following factors may make you more likely to develop this condition: Being 20-40 years old. Being female. Having a family history of Raynaud's phenomenon. Living in a cold climate. Smoking. What are the signs or symptoms? Symptoms of this condition usually occur when you are exposed to cold temperatures or when you have emotional stress. The symptoms may last for a few minutes or up to several hours. They usually affect your fingers but may also affect your toes, nipples, lips, ears, or the  tip of your nose. Symptoms may include: Changes in skin color. The skin in the affected areas will turn pale or white. The skin may then change from white to bluish to red as normal blood flow returns to the area. Numbness, tingling, or pain in the affected areas. In severe cases, symptoms may include: Skin sores. Tissues decaying and dying (gangrene). How is this diagnosed? This condition may be diagnosed based on: Your symptoms and medical history. A physical exam. During the exam, you may be asked to put your hands in cold water to check for a reaction to cold temperature. Tests, such as: Blood tests to check for other diseases or conditions. A test to check the movement of blood through your arteries and veins (vascular ultrasound). A test in which the skin at the base of your fingernail is examined under a microscope (nailfold capillaroscopy). How is this treated? During an episode, you can take actions to help symptoms go away faster. Options include moving your arms around in a windmill pattern, warming your fingers under warm water, or placing your fingers in a warm body fold, such as your armpit. Long-term treatment for this condition often involves making lifestyle changes and taking steps to control your exposure to cold temperature. For more severe cases, medicine (calcium channel blockers) may be used to improve blood circulation. Follow these instructions at home: Avoiding cold temperatures Take these steps to avoid exposure to cold: If possible, stay indoors during cold weather. When you go outside during cold weather, dress in layers and wear mittens, a hat, a scarf, and warm footwear. Wear mittens or gloves when handling ice or frozen food. Use holders for glasses or cans containing   cold drinks. Let warm water run for a while before taking a shower or bath. Warm up the car before driving in cold weather. Lifestyle If possible, avoid stressful and emotional situations. Try  to find ways to manage your stress, such as: Exercise. Yoga. Meditation. Biofeedback. Do not use any products that contain nicotine or tobacco. These products include cigarettes, chewing tobacco, and vaping devices, such as e-cigarettes. If you need help quitting, ask your health care provider. Avoid secondhand smoke. Limit your use of caffeine. Switch to decaffeinated coffee, tea, and soda. Avoid chocolate. Avoid vibrating tools and machinery. General instructions Protect your hands and feet from injuries, cuts, or bruises. Avoid wearing tight rings or wristbands. Wear loose fitting socks and comfortable, roomy shoes. Take over-the-counter and prescription medicines only as told by your health care provider. Where to find support Raynaud's Association: www.raynauds.org Where to find more information National Institute of Arthritis and Musculoskeletal and Skin Diseases: www.niams.nih.gov Contact a health care provider if: Your discomfort becomes worse despite lifestyle changes. You develop sores on your fingers or toes that do not heal. You have breaks in the skin on your fingers or toes. You have a fever. You have pain or swelling in your joints. You have a rash. Your symptoms occur on only one side of your body. Get help right away if: Your fingers or toes turn black. You have severe pain in the affected areas. These symptoms may represent a serious problem that is an emergency. Do not wait to see if the symptoms will go away. Get medical help right away. Call your local emergency services (911 in the [place] Do not drive yourself to the hospital. Summary Raynaud's phenomenon is a condition that affects the arteries that carry blood to the fingers, toes, ears, lips, nipples, or the tip of the nose. In many cases, the cause of this condition is not known. Symptoms of this condition include changes in skin color along with numbness and tingling in the affected area. Treatment for  this condition includes lifestyle changes and reducing exposure to cold temperatures. Medicines may be used for severe cases of the condition. Contact your health care provider if your condition worsens despite treatment. This information is not intended to replace advice given to you by your health care provider. Make sure you discuss any questions you have with your health care provider. Document Revised: 04/08/2020 Document Reviewed: 04/08/2020 Elsevier Patient Education  2024 Elsevier Inc.  

## 2022-09-28 NOTE — Assessment & Plan Note (Signed)
Her symptoms are consistent with Raynaud's.  She has a history of ulcerative colitis we discussed that symptoms may be related to autoimmune disease.  Checking inflammatory markers today.

## 2022-09-28 NOTE — Assessment & Plan Note (Signed)
Update TSH

## 2022-09-28 NOTE — Assessment & Plan Note (Signed)
Favor osteoarthritis however will check uric acid and obtain x-rays.

## 2022-09-30 ENCOUNTER — Other Ambulatory Visit: Payer: Self-pay | Admitting: Family Medicine

## 2022-09-30 DIAGNOSIS — E039 Hypothyroidism, unspecified: Secondary | ICD-10-CM

## 2022-09-30 MED ORDER — LEVOTHYROXINE SODIUM 137 MCG PO TABS: 137.0000 ug | ORAL_TABLET | Freq: Every day | ORAL | 1 refills | Status: DC

## 2022-10-04 DIAGNOSIS — M170 Bilateral primary osteoarthritis of knee: Secondary | ICD-10-CM | POA: Diagnosis not present

## 2022-10-07 ENCOUNTER — Encounter: Payer: Self-pay | Admitting: Family Medicine

## 2022-10-28 ENCOUNTER — Other Ambulatory Visit: Payer: Self-pay | Admitting: Family Medicine

## 2022-12-16 DIAGNOSIS — G4733 Obstructive sleep apnea (adult) (pediatric): Secondary | ICD-10-CM | POA: Diagnosis not present

## 2022-12-24 DIAGNOSIS — K080 Exfoliation of teeth due to systemic causes: Secondary | ICD-10-CM | POA: Diagnosis not present

## 2022-12-27 DIAGNOSIS — K5180 Other ulcerative colitis without complications: Secondary | ICD-10-CM | POA: Diagnosis not present

## 2022-12-29 ENCOUNTER — Encounter: Payer: Self-pay | Admitting: Family Medicine

## 2022-12-29 ENCOUNTER — Ambulatory Visit (INDEPENDENT_AMBULATORY_CARE_PROVIDER_SITE_OTHER): Payer: Medicare Other | Admitting: Family Medicine

## 2022-12-29 VITALS — BP 128/68 | HR 60 | Ht 62.0 in | Wt 144.0 lb

## 2022-12-29 DIAGNOSIS — E039 Hypothyroidism, unspecified: Secondary | ICD-10-CM | POA: Diagnosis not present

## 2022-12-29 DIAGNOSIS — Z23 Encounter for immunization: Secondary | ICD-10-CM | POA: Diagnosis not present

## 2022-12-29 DIAGNOSIS — K5180 Other ulcerative colitis without complications: Secondary | ICD-10-CM | POA: Diagnosis not present

## 2022-12-29 DIAGNOSIS — R5383 Other fatigue: Secondary | ICD-10-CM

## 2022-12-29 DIAGNOSIS — M79672 Pain in left foot: Secondary | ICD-10-CM

## 2022-12-29 DIAGNOSIS — F418 Other specified anxiety disorders: Secondary | ICD-10-CM

## 2022-12-29 DIAGNOSIS — L600 Ingrowing nail: Secondary | ICD-10-CM | POA: Diagnosis not present

## 2022-12-29 NOTE — Progress Notes (Signed)
Stephanie Ramsey - 72 y.o. female MRN [id number]  Date of birth: [birth year]  Subjective Chief Complaint  Patient presents with   Foot Injury   Ingrown Toenail   URI    HPI Stephanie Ramsey is a 72 y.o. female here today for follow up.   She reports that she is doing ok.  She has had some pain in the heel of her L foot.  She is also having some issues with thickened, ingrown toenails.  She would like a referral to podiatry.  TSH supressed on last labs.  Due to have this rechecked.  She is currently taking of levothyroxine daily.    Mood stable with Lexapro at current strength.  She is not experiencing any side effects with this.  She feels that this is working pretty well.  ROS:  A comprehensive ROS was completed and negative except as noted per HPI   Allergies  Allergen Reactions   Oxycontin [Oxycodone] Swelling and Other (See Comments)    HYPOTENSION   Penicillins Rash and Other (See Comments)    Unknown  Unknown    Propofol     Past Medical History:  Diagnosis Date   Arthritis    Cancer (HCC)    right breast   Carpal tunnel syndrome    Chronic back pain    Complication of anesthesia    "stopped breathing during procedure and dizziness after procedures"   Depression    Hypercholesterolemia    Hypothyroidism    Osteopenia    Pneumonia    Ulcerative colitis (HCC)     Past Surgical History:  Procedure Laterality Date   BILATERAL CARPAL TUNNEL RELEASE     BREAST BIOPSY Right 2011   In Situ DCIS   BREAST LUMPECTOMY Right 2011   in situ    BREAST SURGERY     biopsy and lumpectomy right breast   CHOLECYSTECTOMY     COLONOSCOPY W/ BIOPSIES AND POLYPECTOMY     JOINT REPLACEMENT     Total right hip   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   Procedure: LEFT TOTAL HIP ARTHROPLASTY;  Surgeon: Valeria Batman, MD;  Location: [facility] OR;  Service: Orthopedics;  Laterality: Left;   TRIGGER FINGER RELEASE      Social History   Socioeconomic History   Marital status:  Married    Spouse name: Cristal Deer   Number of children: 2   Years of education: 18   Highest education level: Master's degree (e.g., MA, MS, MEng, MEd, MSW, MBA)  Occupational History    Comment: Retired  Tobacco Use   Smoking status: Former    Current packs/day: 0.00    Types: Cigarettes    Quit date: 10/12/1979    Years since quitting: 43.2   Smokeless tobacco: Never   Tobacco comments:    quit smoking cigarettes 32 years ago  Vaping Use   Vaping status: Never Used  Substance and Sexual Activity   Alcohol use: Yes    Alcohol/week: 3.0 standard drinks of alcohol    Types: 3 Glasses of wine per week    Comment: social   Drug use: No   Sexual activity: Not on file  Other Topics Concern   Not on file  Social History Narrative   Lives with her husband. She enjoys playing with her dog and walking.   Social Determinants of Health   Financial Resource Strain: Low Risk  (11/06/2021)   Overall Financial Resource Strain (CARDIA)    Difficulty of Paying  Living Expenses: Not hard at all  Food Insecurity: No Food Insecurity (11/06/2021)   Hunger Vital Sign    Worried About Running Out of Food in the Last Year: Never true    Ran Out of Food in the Last Year: Never true  Transportation Needs: No Transportation Needs (11/06/2021)   PRAPARE - Administrator, Civil Service (Medical): No    Lack of Transportation (Non-Medical): No  Physical Activity: Insufficiently Active (11/06/2021)   Exercise Vital Sign    Days of Exercise per Week: 4 days    Minutes of Exercise per Session: 20 min  Stress: No Stress Concern Present (11/06/2021)   [employer] of Occupational Health - Occupational Stress Questionnaire    Feeling of Stress : Not at all  Social Connections: Socially Integrated (11/06/2021)   Social Connection and Isolation Panel [NHANES]    Frequency of Communication with Friends and Family: Three times a week    Frequency of Social Gatherings with Friends and Family:  Never    Attends Religious Services: More than 4 times per year    Active Member of Clubs or Organizations: Yes    Attends Engineer, structural: More than 4 times per year    Marital Status: Married    Family History  Problem Relation Age of Onset   Breast cancer Mother    Kidney failure Father    Heart Problems Father    Colon cancer Sister    Lung cancer Brother    Arthritis Other     Health Maintenance  Topic Date Due   Hepatitis C Screening  Never done   Medicare Annual Wellness (AWV)  11/07/2022   COVID-19 Vaccine (8 - 2023-24 season) 02/23/2023   Colonoscopy  08/27/2023   MAMMOGRAM  03/16/2024   DTaP/Tdap/Td (4 - Td or Tdap) 06/10/2031   Pneumonia Vaccine 20+ Years old  Completed   INFLUENZA VACCINE  Completed   DEXA SCAN  Completed   Zoster Vaccines- Shingrix  Completed   HPV VACCINES  Aged Out     ----------------------------------------------------------------------------------------------------------------------------------------------------------------------------------------------------------------- Physical Exam BP 128/68   Pulse 60   Ht 5\' 2"  (1.575 m)   Wt 144 lb (65.3 kg)   SpO2 99%   BMI 26.34 kg/m   Physical Exam Constitutional:      Appearance: Normal appearance.  Eyes:     General: No scleral icterus. Cardiovascular:     Rate and Rhythm: Normal rate and regular rhythm.  Pulmonary:     Effort: Pulmonary effort is normal.     Breath sounds: Normal breath sounds.  Skin:    Comments: Bilateral ingrown toenails of great toe.  Neurological:     Mental Status: She is alert.  Psychiatric:        Mood and Affect: Mood normal.        Behavior: Behavior normal.     ------------------------------------------------------------------------------------------------------------------------------------------------------------------------------------------------------------------- Assessment and Plan  Hypothyroidism Recheck TSH levels  today.  Ulcerative colitis (HCC) Stable at this time, managed by gastroenterology.  Updating B12 and vitamin D levels.  Depression with anxiety She continues to do well with Lexapro at current strength.  Recommend continuation.  Pain of left heel She continues to have pain in the heel.  Also with ingrown toenails.  Would like to see podiatry, referral entered.     No orders of the defined types were placed in this encounter.   No follow-ups on file.    This visit occurred during the SARS-CoV-2 public health emergency.  Safety  protocols were in place, including screening questions prior to the visit, additional usage of staff PPE, and extensive cleaning of exam room while observing appropriate contact time as indicated for disinfecting solutions.

## 2022-12-30 LAB — CBC WITH DIFFERENTIAL/PLATELET
Basophils Absolute: 0 10*3/uL (ref 0.0–0.2)
Basos: 1 %
EOS (ABSOLUTE): 0 10*3/uL (ref 0.0–0.4)
Eos: 1 %
Hematocrit: 43.5 % (ref 34.0–46.6)
Hemoglobin: 14 g/dL (ref 11.1–15.9)
Immature Grans (Abs): 0 10*3/uL (ref 0.0–0.1)
Immature Granulocytes: 0 %
Lymphocytes Absolute: 1.6 10*3/uL (ref 0.7–3.1)
Lymphs: 37 %
MCH: 33 pg (ref 26.6–33.0)
MCHC: 32.2 g/dL (ref 31.5–35.7)
MCV: 103 fL — ABNORMAL HIGH (ref 79–97)
Monocytes Absolute: 0.3 10*3/uL (ref 0.1–0.9)
Monocytes: 7 %
Neutrophils Absolute: 2.3 10*3/uL (ref 1.4–7.0)
Neutrophils: 54 %
Platelets: 218 10*3/uL (ref 150–450)
RBC: 4.24 x10E6/uL (ref 3.77–5.28)
RDW: 12.5 % (ref 11.7–15.4)
WBC: 4.3 10*3/uL (ref 3.4–10.8)

## 2022-12-30 LAB — CMP14+EGFR
ALT: 19 [IU]/L (ref 0–32)
AST: 26 [IU]/L (ref 0–40)
Albumin: 4.7 g/dL (ref 3.8–4.8)
Alkaline Phosphatase: 77 [IU]/L (ref 44–121)
BUN/Creatinine Ratio: 15 (ref 12–28)
BUN: 12 mg/dL (ref 8–27)
Bilirubin Total: 0.4 mg/dL (ref 0.0–1.2)
CO2: 24 mmol/L (ref 20–29)
Calcium: 9.8 mg/dL (ref 8.7–10.3)
Chloride: 102 mmol/L (ref 96–106)
Creatinine, Ser: 0.78 mg/dL (ref 0.57–1.00)
Globulin, Total: 2.1 g/dL (ref 1.5–4.5)
Glucose: 96 mg/dL (ref 70–99)
Potassium: 4.8 mmol/L (ref 3.5–5.2)
Sodium: 141 mmol/L (ref 134–144)
Total Protein: 6.8 g/dL (ref 6.0–8.5)
eGFR: 80 mL/min/{1.73_m2} (ref >59)

## 2022-12-30 LAB — IRON,TIBC AND FERRITIN PANEL
Ferritin: 116 ng/mL (ref 15–150)
Iron Saturation: 27 % (ref 15–55)
Iron: 77 ug/dL (ref 27–139)
Total Iron Binding Capacity: 289 ug/dL (ref 250–450)
UIBC: 212 ug/dL (ref 118–369)

## 2022-12-30 LAB — VITAMIN B12: Vitamin B-12: 1331 pg/mL — ABNORMAL HIGH (ref 232–1245)

## 2022-12-30 LAB — VITAMIN D 25 HYDROXY (VIT D DEFICIENCY, FRACTURES): Vit D, 25-Hydroxy: 73.9 ng/mL (ref 30.0–100.0)

## 2022-12-30 LAB — TSH: TSH: 0.483 u[IU]/mL (ref 0.450–4.500)

## 2022-12-31 ENCOUNTER — Encounter: Payer: Self-pay | Admitting: Podiatry

## 2022-12-31 ENCOUNTER — Ambulatory Visit (INDEPENDENT_AMBULATORY_CARE_PROVIDER_SITE_OTHER): Payer: Medicare Other | Admitting: Podiatry

## 2022-12-31 DIAGNOSIS — L603 Nail dystrophy: Secondary | ICD-10-CM | POA: Diagnosis not present

## 2022-12-31 DIAGNOSIS — L600 Ingrowing nail: Secondary | ICD-10-CM | POA: Diagnosis not present

## 2022-12-31 NOTE — Patient Instructions (Signed)

## 2022-12-31 NOTE — Progress Notes (Signed)
  Subjective:  Patient ID: Stephanie Ramsey, female    DOB: 17-Jun-1950,   MRN: [id number]  Chief Complaint  Patient presents with   Nail Problem    Rfc.    72 y.o. female presents for concern of bilateral dystrophic toenails. Relates they have been this way for years. Relates they have started to become painful. Was told in the past she could have them removed and relates she is ready to discuss that today . Denies any other pedal complaints. Denies n/v/f/c.   Past Medical History:  Diagnosis Date   Arthritis    Cancer (HCC)    right breast   Carpal tunnel syndrome    Chronic back pain    Complication of anesthesia    "stopped breathing during procedure and dizziness after procedures"   Depression    Hypercholesterolemia    Hypothyroidism    Osteopenia    Pneumonia    Ulcerative colitis (HCC)     Objective:  Physical Exam: Vascular: DP/PT pulses 2/4 bilateral. CFT <3 seconds. Normal hair growth on digits. No edema.  Skin. No lacerations or abrasions bilateral feet. Bialteral hallux nails thickened dystrophic and tender to palpation.  Musculoskeletal: MMT 5/5 bilateral lower extremities in DF, PF, Inversion and Eversion. Deceased ROM in DF of ankle joint.  Neurological: Sensation intact to light touch.   Assessment:   1. Onychodystrophy   2. Ingrown right greater toenail   3. Ingrown left greater toenail      Plan:  Patient was evaluated and treated and all questions answered. Discussed ingrown toenails etiology and treatment options including procedure for removal vs conservative care.  Patient requesting removal of ingrown nail today. Procedure below.  Discussed procedure and post procedure care and patient expressed understanding.  Will follow-up in 2 weeks for nail check or sooner if any problems arise.    Procedure:  Procedure: total Nail Avulsion of bilateral hallux  Surgeon: Louann Sjogren, DPM  Pre-op Dx: Ingrown toenail without infection Post-op: Same  Place  of Surgery: Office exam room.  Indications for surgery: Painful and ingrown toenail.    The patient is requesting removal of nail with  chemical matrixectomy. Risks and complications were discussed with the patient for which they understand and written consent was obtained. Under sterile conditions a total of 3 mL of  1% lidocaine plain was infiltrated in a hallux block fashion. Once anesthetized, the skin was prepped in sterile fashion. A tourniquet was then applied. Next the entire hallux nail bilateral was removed. Next phenol was then applied under standard conditions to permanently destroy the matrix and copiously irrigated. Silvadene was applied. A dry sterile dressing was applied. After application of the dressing the tourniquet was removed and there is found to be an immediate capillary refill time to the digit. The patient tolerated the procedure well without any complications. Post procedure instructions were discussed the patient for which he verbally understood. Follow-up in two weeks for nail check or sooner if any problems are to arise. Discussed signs/symptoms of infection and directed to call the office immediately should any occur or go directly to the emergency room. In the meantime, encouraged to call the office with any questions, concerns, changes symptoms.   Louann Sjogren, DPM

## 2023-01-02 NOTE — Assessment & Plan Note (Signed)
She continues to do well with Lexapro at current strength.  Recommend continuation. ?

## 2023-01-02 NOTE — Assessment & Plan Note (Signed)
Recheck TSH levels today.

## 2023-01-02 NOTE — Assessment & Plan Note (Addendum)
Stable at this time, managed by gastroenterology.  Updating B12 and vitamin D levels.

## 2023-01-02 NOTE — Assessment & Plan Note (Addendum)
She continues to have pain in the heel.  Also with ingrown toenails.  Would like to see podiatry, referral entered.

## 2023-01-06 ENCOUNTER — Ambulatory Visit (INDEPENDENT_AMBULATORY_CARE_PROVIDER_SITE_OTHER): Payer: Medicare Other | Admitting: Family Medicine

## 2023-01-06 ENCOUNTER — Encounter: Payer: Self-pay | Admitting: Family Medicine

## 2023-01-06 VITALS — BP 124/76 | HR 65 | Temp 98.0°F | Resp 18 | Ht 62.0 in | Wt 146.5 lb

## 2023-01-06 DIAGNOSIS — Z1159 Encounter for screening for other viral diseases: Secondary | ICD-10-CM | POA: Diagnosis not present

## 2023-01-06 DIAGNOSIS — Z0000 Encounter for general adult medical examination without abnormal findings: Secondary | ICD-10-CM

## 2023-01-06 NOTE — Progress Notes (Signed)
Subjective:   Stephanie Ramsey is a 72 y.o. female who presents for Medicare Annual (Subsequent) preventive examination.  Visit Complete: In person  Patient Medicare AWV questionnaire was completed by the patient on 01/06/23; I have confirmed that all information answered by patient is correct and no changes since this date.  Cardiac Risk Factors include: advanced age (>16men, >22 women)     Objective:    Today's Vitals   01/06/23 0944  BP: 124/76  Pulse: 65  Resp: 18  Temp: 98 F (36.7 C)  TempSrc: Tympanic  SpO2: 97%  Weight: 146 lb 8 oz (66.5 kg)  Height: 5\' 2"  (1.575 m)   Body mass index is 26.8 kg/m.     01/06/2023   10:08 AM 11/17/2021    2:15 PM 11/06/2021    9:57 AM 12/04/2020    8:52 AM 06/20/2020    9:56 AM 02/21/2019    9:27 AM 05/15/2018    5:13 PM  Advanced Directives  Does Patient Have a Medical Advance Directive? Yes Yes Yes Yes Yes Yes No  Type of Estate agent of [place];Living will  Living will Healthcare Power of [place];Living will Healthcare Power of [place];Living will Healthcare Power of [place];Living will   Does patient want to make changes to medical advance directive? No - Patient declined  No - Patient declined Yes (MAU/Ambulatory/Procedural Areas - Information given) No - Patient declined No - Patient declined   Copy of Healthcare Power of Attorney in Chart?     No - copy requested Yes - validated most recent copy scanned in chart (See row information)   Would patient like information on creating a medical advance directive?       No - Patient declined    Current Medications (verified) Outpatient Encounter Medications as of 01/06/2023  Medication Sig   acyclovir (ZOVIRAX) 400 MG tablet Take by mouth. As needed.   atorvastatin (LIPITOR) 20 MG tablet TAKE 1 TABLET BY MOUTH EVERY DAY   diclofenac sodium (VOLTAREN) 1 % GEL APP 2 TO 4 GRAMS AA BID PRN P.   escitalopram (LEXAPRO) 10 MG tablet TAKE 1 AND 1/2 TABLETS DAILY BY  MOUTH   levothyroxine (SYNTHROID) 137 MCG tablet Take 1 tablet (137 mcg total) by mouth daily.   levothyroxine (SYNTHROID) 150 MCG tablet TAKE 1 TABLET BY MOUTH EVERY DAY   Magnesium 500 MG CAPS Take by mouth.   Multiple Vitamins-Minerals (MULTIVITAMIN WITH MINERALS) tablet Take 1 tablet by mouth daily.   Omega-3 Fatty Acids (FISH OIL) 1360 MG CAPS Take by mouth.   potassium gluconate 595 (99 K) MG TABS tablet Take 595 mg by mouth.   sulfaSALAzine (AZULFIDINE) 500 MG tablet Take 500 mg by mouth 3 (three) times daily.   valACYclovir (VALTREX) 500 MG tablet Take 500 mg by mouth 2 (two) times daily. As needed.   gabapentin (NEURONTIN) 300 MG capsule Take 1 capsule (300 mg total) by mouth daily as needed.   No facility-administered encounter medications on file as of 01/06/2023.    Allergies (verified) Oxycontin [oxycodone], Penicillins, and Propofol   History: Past Medical History:  Diagnosis Date   Arthritis    Cancer (HCC)    right breast   Carpal tunnel syndrome    Chronic back pain    Complication of anesthesia    "stopped breathing during procedure and dizziness after procedures"   Depression    Hypercholesterolemia    Hypothyroidism    Leg cramps    Osteopenia  Pneumonia    Ulcerative colitis [facility])    Past Surgical History:  Procedure Laterality Date   BILATERAL CARPAL TUNNEL RELEASE     BREAST BIOPSY Right 2011   In Situ DCIS   BREAST LUMPECTOMY Right 2011   in situ    BREAST SURGERY     biopsy and lumpectomy right breast   CHOLECYSTECTOMY     COLONOSCOPY W/ BIOPSIES AND POLYPECTOMY     JOINT REPLACEMENT     Total right hip   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   Procedure: LEFT TOTAL HIP ARTHROPLASTY;  Surgeon: Valeria Batman, MD;  Location: [facility] OR;  Service: Orthopedics;  Laterality: Left;   TRIGGER FINGER RELEASE     Family History  Problem Relation Age of Onset   Breast cancer Mother    Kidney failure Father    Heart Problems Father    Colon  cancer Sister    Lung cancer Brother    Arthritis Other    Social History   Socioeconomic History   Marital status: Married    Spouse name: Cristal Deer   Number of children: 2   Years of education: 18   Highest education level: Master's degree (e.g., MA, MS, MEng, MEd, MSW, MBA)  Occupational History    Comment: Retired  Tobacco Use   Smoking status: Former    Current packs/day: 0.00    Types: Cigarettes    Quit date: 10/12/1979    Years since quitting: 43.2   Smokeless tobacco: Never   Tobacco comments:    quit smoking cigarettes 32 years ago  Vaping Use   Vaping status: Never Used  Substance and Sexual Activity   Alcohol use: Yes    Alcohol/week: 3.0 standard drinks of alcohol    Types: 3 Glasses of wine per week    Comment: social   Drug use: No   Sexual activity: Not on file  Other Topics Concern   Not on file  Social History Narrative   Lives with her husband. She enjoys playing with her dog and walking.   Social Determinants of Health   Financial Resource Strain: Low Risk  (01/06/2023)   Overall Financial Resource Strain (CARDIA)    Difficulty of Paying Living Expenses: Not hard at all  Food Insecurity: No Food Insecurity (01/06/2023)   Hunger Vital Sign    Worried About Running Out of Food in the Last Year: Never true    Ran Out of Food in the Last Year: Never true  Transportation Needs: No Transportation Needs (01/06/2023)   PRAPARE - Administrator, Civil Service (Medical): No    Lack of Transportation (Non-Medical): No  Physical Activity: Sufficiently Active (01/06/2023)   Exercise Vital Sign    Days of Exercise per Week: 5 days    Minutes of Exercise per Session: 120 min  Stress: No Stress Concern Present (01/06/2023)   [employer] of Occupational Health - Occupational Stress Questionnaire    Feeling of Stress : Only a little  Social Connections: Socially Integrated (01/06/2023)   Social Connection and Isolation Panel [NHANES]     Frequency of Communication with Friends and Family: More than three times a week    Frequency of Social Gatherings with Friends and Family: Twice a week    Attends Religious Services: More than 4 times per year    Active Member of [employer] or Organizations: Yes    Attends Banker Meetings: More than 4 times per year    Marital Status:  Married    Tobacco Counseling Counseling given: Not Answered Tobacco comments: quit smoking cigarettes 32 years ago   Clinical Intake:  Pre-visit preparation completed: No  Pain : No/denies pain     BMI - recorded: 26.8 Nutritional Status: BMI 25 -29 Overweight Nutritional Risks: None Diabetes: No  How often do you need to have someone help you when you read instructions, pamphlets, or other written materials from your doctor or pharmacy?: 1 - Never  Interpreter Needed?: No      Activities of Daily Living    01/06/2023    9:59 AM  In your present state of health, do you have any difficulty performing the following activities:  Hearing? 0  Vision? 0  Difficulty concentrating or making decisions? 0  Walking or climbing stairs? 1  Comment knee problems  Dressing or bathing? 0  Doing errands, shopping? 0  Preparing Food and eating ? N  Using the Toilet? N  In the past six months, have you accidently leaked urine? N  Do you have problems with loss of bowel control? N  Managing your Medications? N  Managing your Finances? N  Housekeeping or managing your Housekeeping? N    Patient Care Team: Everrett Coombe, DO as PCP - General (Family Medicine) Waylan Boga, MD as Referring Physician (Orthopedic Surgery) Unsure of new provider,  MD as Consulting Physician (Gastroenterology) Almyra Deforest, podiatry  Dr. Linna Darner, Derm   Indicate any recent Medical Services you may have received from other than [facility] providers in the past year (date may be approximate).     Assessment:   This is a routine wellness examination for  [place].  Hearing/Vision screen Hearing Screening - Comments:: Grossly intact, not tested Vision Screening - Comments:: Grossly intact, wears reading and driving glasses   Goals Addressed             This Visit's Progress    Exercise 150 min/wk Moderate Activity       Walks for exercise.       Depression Screen    01/06/2023   10:12 AM 01/06/2023   10:06 AM 12/29/2022    9:36 AM 09/28/2022    1:25 PM 05/04/2022    1:59 PM 11/12/2021    3:28 PM 11/06/2021    9:59 AM  PHQ 2/9 Scores  PHQ - 2 Score 0 0 0 0 0 0 1  PHQ- 9 Score 0 0 1 1  1      Fall Risk    01/06/2023   10:10 AM 12/29/2022    9:36 AM 09/28/2022    1:25 PM 05/04/2022    1:59 PM 11/06/2021    9:57 AM  Fall Risk   Falls in the past year? 1 1 0 1 1  Number falls in past yr: 0 0 0 1 1  Injury with Fall? 0 0 0 1 0  Risk for fall due to : Other (Comment) Other (Comment) No Fall Risks  History of fall(s)  Risk for fall due to: Comment tripped on dog so she would not step on him      Follow up  Falls evaluation completed Falls evaluation completed  Falls evaluation completed;Education provided;Falls prevention discussed    MEDICARE RISK AT HOME: Medicare Risk at Home Any stairs in or around the home?: Yes If so, are there any without handrails?: Yes Home free of loose throw rugs in walkways, pet beds, electrical cords, etc?: Yes Adequate lighting in your home to reduce risk of falls?: Yes Life alert?:  No Use of a cane, walker or w/c?: Yes Grab bars in the bathroom?: Yes Shower chair or bench in shower?: No Elevated toilet seat or a handicapped toilet?: Yes  TIMED UP AND GO:  Was the test performed?  Yes  Length of time to ambulate 10 feet: 7 sec Gait steady and fast without use of assistive device    Cognitive Function:        01/06/2023   10:13 AM 11/06/2021   10:09 AM 06/20/2020   10:11 AM  6CIT Screen  What Year? 0 points 0 points 0 points  What month? 0 points 0 points 0 points  What time? 0  points 0 points 0 points  Count back from 20 0 points 2 points 0 points  Months in reverse 0 points 0 points 0 points  Repeat phrase 0 points 2 points 0 points  Total Score 0 points 4 points 0 points    Immunizations Immunization History  Administered Date(s) Administered   Fluad Quad(high Dose 65+) 12/17/2018, 11/12/2021, 12/29/2022   Influenza Split 12/14/2004, 11/22/2011, 12/11/2012, 11/27/2013, 12/03/2016   Influenza, High Dose Seasonal PF 01/04/2016, 01/02/2018, 11/08/2018, 12/04/2019   Influenza-Unspecified 12/14/2004, 11/22/2011, 12/11/2012, 11/27/2013, 12/03/2016   Moderna Covid-19 Vaccine Bivalent Booster 55yrs & up 01/27/2021   Moderna Sars-Covid-2 Vaccination 05/29/2019, 06/26/2019, 01/01/2020, 10/13/2020   PNEUMOCOCCAL CONJUGATE-20 11/06/2021   Pfizer(Comirnaty)Fall Seasonal Vaccine 12 years and older 12/24/2021, 12/29/2022   Pneumococcal Conjugate-13 08/13/2016   Pneumococcal Polysaccharide-23 01/02/2014   Pneumococcal-Unspecified 01/02/2014   RSV,unspecified 01/12/2022   Rabies Immune Globulin 05/15/2018, 05/18/2018, 05/22/2018, 05/29/2018   Rabies, IM 05/15/2018, 05/18/2018, 05/22/2018, 05/29/2018   Tdap 12/16/2006, 05/03/2016, 06/09/2021   Zoster Recombinant(Shingrix) 08/13/2016, 12/21/2016   Zoster, Live 09/28/2010, 12/21/2016   Zoster, Unspecified 12/21/2016    TDAP status: Up to date  Flu Vaccine status: Up to date  Pneumococcal vaccine status: Up to date  Covid-19 vaccine status: Completed vaccines  Qualifies for Shingles Vaccine? Yes   Zostavax completed Yes   Shingrix Completed?: Yes  Screening Tests Health Maintenance  Topic Date Due   Hepatitis C Screening  Never done   COVID-19 Vaccine (8 - 2023-24 season) 02/23/2023   Colonoscopy  08/27/2023   Medicare Annual Wellness (AWV)  01/06/2024   MAMMOGRAM  03/16/2024   DTaP/Tdap/Td (4 - Td or Tdap) 06/10/2031   Pneumonia Vaccine 60+ Years old  Completed   INFLUENZA VACCINE  Completed   DEXA SCAN   Completed   Zoster Vaccines- Shingrix  Completed   HPV VACCINES  Aged Out    Health Maintenance  Health Maintenance Due  Topic Date Due   Hepatitis C Screening  Never done    Colorectal cancer screening: Type of screening: Colonoscopy. Completed 08/26/2020. Repeat every 2 years. Due to ulcerative colitis.   Mammogram status: Completed 03/16/22. Repeat every year  Bone Density status: Completed 06/17/21. Results reflect: Bone density results: OSTEOPENIA. Repeat every 2 years.  Lung Cancer Screening: (Low Dose CT Chest recommended if Age 70-80 years, 20 pack-year currently smoking OR have quit w/in 15years.) does not qualify.   Lung Cancer Screening Referral: n/a  Additional Screening:  Hepatitis C Screening: does qualify; Completed 01/06/23  Vision Screening: Recommended annual ophthalmology exams for early detection of glaucoma and other disorders of the eye. Is the patient up to date with their annual eye exam?  Yes  Who is the provider or what is the name of the office in which the patient attends annual eye exams? Dr. Carlynn Purl If pt is not established  with a provider, would they like to be referred to a provider to establish care? No .   Dental Screening: Recommended annual dental exams for proper oral hygiene  Diabetic Foot Exam: n/a   Community Resource Referral / Chronic Care Management: CRR required this visit?  No   CCM required this visit?  No     Plan:     I have personally reviewed and noted the following in the patient's chart:   Medical and social history Use of alcohol, tobacco or illicit drugs  Current medications and supplements including opioid prescriptions. Patient is not currently taking opioid prescriptions. Functional ability and status Nutritional status Physical activity Advanced directives List of other physicians Hospitalizations, surgeries, and ER visits in previous 12 months: no  Vitals Screenings to include cognitive, depression, and  falls Referrals and appointments  In addition, I have reviewed and discussed with patient certain preventive protocols, quality metrics, and best practice recommendations. A written personalized care plan for preventive services as well as general preventive health recommendations were provided to patient.     Novella Olive, FNP   01/06/2023   After Visit Summary: (In Person-Declined) Patient declined AVS at this time.  Follow-up with PCP as scheduled. Getting Hepatitis C screening today.

## 2023-01-07 LAB — HEPATITIS C ANTIBODY: Hep C Virus Ab: NONREACTIVE

## 2023-01-11 DIAGNOSIS — H25013 Cortical age-related cataract, bilateral: Secondary | ICD-10-CM | POA: Diagnosis not present

## 2023-01-11 DIAGNOSIS — H35341 Macular cyst, hole, or pseudohole, right eye: Secondary | ICD-10-CM | POA: Diagnosis not present

## 2023-01-11 DIAGNOSIS — H2513 Age-related nuclear cataract, bilateral: Secondary | ICD-10-CM | POA: Diagnosis not present

## 2023-01-24 DIAGNOSIS — M7062 Trochanteric bursitis, left hip: Secondary | ICD-10-CM | POA: Diagnosis not present

## 2023-01-24 DIAGNOSIS — M25552 Pain in left hip: Secondary | ICD-10-CM | POA: Diagnosis not present

## 2023-01-24 DIAGNOSIS — M170 Bilateral primary osteoarthritis of knee: Secondary | ICD-10-CM | POA: Diagnosis not present

## 2023-01-28 ENCOUNTER — Ambulatory Visit: Payer: Medicare Other | Admitting: Podiatry

## 2023-02-04 ENCOUNTER — Ambulatory Visit: Payer: Medicare Other | Admitting: Podiatry

## 2023-02-11 ENCOUNTER — Ambulatory Visit (INDEPENDENT_AMBULATORY_CARE_PROVIDER_SITE_OTHER): Payer: Medicare Other | Admitting: Podiatry

## 2023-02-11 ENCOUNTER — Encounter: Payer: Self-pay | Admitting: Podiatry

## 2023-02-11 DIAGNOSIS — L603 Nail dystrophy: Secondary | ICD-10-CM

## 2023-02-11 DIAGNOSIS — L600 Ingrowing nail: Secondary | ICD-10-CM

## 2023-02-11 NOTE — Progress Notes (Signed)
  Subjective:  Patient ID: Stephanie Ramsey, female    DOB: Jan 26, 1951,   MRN: [id number]  No chief complaint on file.   72 y.o. female presents for follow-up of bilateral total nail avulsion of great toes. Relates doing well with no pain. Has been soaking as instructed.  . Denies any other pedal complaints. Denies n/v/f/c.   Past Medical History:  Diagnosis Date   Arthritis    Cancer (HCC)    right breast   Carpal tunnel syndrome    Chronic back pain    Complication of anesthesia    "stopped breathing during procedure and dizziness after procedures"   Depression    Hypercholesterolemia    Hypothyroidism    Leg cramps    Osteopenia    Pneumonia    Ulcerative colitis (HCC)     Objective:  Physical Exam: Vascular: DP/PT pulses 2/4 bilateral. CFT <3 seconds. Normal hair growth on digits. No edema.  Skin. No lacerations or abrasions bilateral feet. Bilateral hallux nails have healed well Musculoskeletal: MMT 5/5 bilateral lower extremities in DF, PF, Inversion and Eversion. Deceased ROM in DF of ankle joint.  Neurological: Sensation intact to light touch.   Assessment:   1. Onychodystrophy   2. Ingrown right greater toenail      Plan:  Patient was evaluated and treated and all questions answered. Toe was evaluated and appears to be healing well.  May discontinue soaks and neosporin.  Patient to follow-up as needed.    Louann Sjogren, DPM

## 2023-02-23 ENCOUNTER — Ambulatory Visit: Payer: Self-pay | Admitting: Family Medicine

## 2023-02-23 NOTE — Telephone Encounter (Signed)
  Chief Complaint: L shoulder pain Symptoms: pain, some arm movements make the pain worse along the spin Frequency: 3 days Pertinent Negatives: Patient denies fevers, denies N/V, denies diaphoresis, denies SOB, denies cardiac hx,  Disposition: [] ED /[] Urgent Care (no appt availability in office) / [x] Appointment(In office/virtual)/ []  [place]/ [] Home Care/ [] Refused Recommended Disposition /[] [facility]/ []  Follow-up with PCP  Additional Notes: suffering HA with neck/back/shoulder pain. Hot pads, advil-nothing seems to be helping, states advil is expired.  Pt advised to purchase non expired OTC pain relievers and could attempt using lidocaine  pain patch. Pt confirms the pain is definetely worse with movement of her neck and her arm. Pt has had this in the past but states she cannot remember what she was dx with. Pt denies cardiac hx. Pt denies injury. States there is a tightness in her mid chest when she moves her arm a certain way. Pt sched for tomorrow, agreed to call back or go to the ED should she develop SOB, CP, diaphoresis, N/V, fever or worsening pain.   Copied from CRM [id number]. Topic: Clinical - Red Word Triage >> Feb 23, 2023  2:53 PM Bascom RAMAN wrote: Red Word that prompted transfer to Nurse Triage: Patient has really bad pain on neck to middle of back. barely able to neck and extremely painful when lifting left arm. Had same issue on right side last week. Callback number is [phone]. Reason for Disposition  [1] MODERATE pain (e.g., interferes with normal activities) AND [2] present > 3 days  Answer Assessment - Initial Assessment Questions 1. ONSET: When did the pain start?     3 days 2. LOCATION: Where is the pain located?     L shoulder blade 3. PAIN: How bad is the pain? (Scale 1-10; or mild, moderate, severe)   - MILD (1-3): doesn't interfere with normal activities   - MODERATE (4-7): interferes with normal activities (e.g., work or school) or  awakens from sleep   - SEVERE (8-10): excruciating pain, unable to do any normal activities, unable to move arm at all due to pain     9 4. WORK OR EXERCISE: Has there been any recent work or exercise that involved this part of the body?     denies 5. CAUSE: What do you think is causing the shoulder pain?     unsure 6. OTHER SYMPTOMS: Do you have any other symptoms? (e.g., neck pain, swelling, rash, fever, numbness, weakness)     Neck pain, numbness near shoulder blades,  Protocols used: Shoulder Pain-A-AH

## 2023-02-24 ENCOUNTER — Ambulatory Visit (INDEPENDENT_AMBULATORY_CARE_PROVIDER_SITE_OTHER): Payer: Medicare PPO | Admitting: Family Medicine

## 2023-02-24 ENCOUNTER — Encounter: Payer: Self-pay | Admitting: Family Medicine

## 2023-02-24 VITALS — BP 157/77 | HR 103 | Ht 62.0 in | Wt 145.5 lb

## 2023-02-24 DIAGNOSIS — M5030 Other cervical disc degeneration, unspecified cervical region: Secondary | ICD-10-CM

## 2023-02-24 MED ORDER — TIZANIDINE HCL 4 MG PO TABS: 4.0000 mg | ORAL_TABLET | Freq: Four times a day (QID) | ORAL | 0 refills | Status: DC | PRN

## 2023-02-24 MED ORDER — PREDNISONE 20 MG PO TABS: 20.0000 mg | ORAL_TABLET | Freq: Every day | ORAL | 0 refills | Status: AC

## 2023-02-27 DIAGNOSIS — M5030 Other cervical disc degeneration, unspecified cervical region: Secondary | ICD-10-CM | POA: Insufficient documentation

## 2023-02-27 NOTE — Assessment & Plan Note (Signed)
 Feeling pain and spasm of the cervical paraspinals and into the rhomboids.  Adding course of prednisone  as well as tizanidine .  Recommend trying heat to the area as well.  She will let me know if not improving.  We can consider adding physical therapy if symptoms persist.

## 2023-02-27 NOTE — Progress Notes (Signed)
 Stephanie Ramsey - 73 y.o. female MRN [id number]  Date of birth: 09/24/1950  Subjective Chief Complaint  Patient presents with   Neck Pain    HPI Stephanie Ramsey is a 73 year old female here today with complaint of neck pain.  Pain started a couple days ago.  She does have radiation to the shoulder blades and rhomboids.   denies pain and weakness into the arms.  She has tried muscle rubs/Salonpas with slight improvement.  No precipitating injury that she is aware of.  ROS:  A comprehensive ROS was completed and negative except as noted per HPI  Allergies  Allergen Reactions   Oxycontin  [Oxycodone ] Swelling and Other (See Comments)    HYPOTENSION   Penicillins Rash and Other (See Comments)    Unknown  Unknown    Propofol      Past Medical History:  Diagnosis Date   Arthritis    Cancer (HCC)    right breast   Carpal tunnel syndrome    Chronic back pain    Complication of anesthesia    stopped breathing during procedure and dizziness after procedures   Depression    Hypercholesterolemia    Hypothyroidism    Leg cramps    Osteopenia    Pneumonia    Ulcerative colitis (HCC)     Past Surgical History:  Procedure Laterality Date   BILATERAL CARPAL TUNNEL RELEASE     BREAST BIOPSY Right 2011   In Situ DCIS   BREAST LUMPECTOMY Right 2011   in situ    BREAST SURGERY     biopsy and lumpectomy right breast   CHOLECYSTECTOMY     COLONOSCOPY W/ BIOPSIES AND POLYPECTOMY     JOINT REPLACEMENT     Total right hip   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   Procedure: LEFT TOTAL HIP ARTHROPLASTY;  Surgeon: Maude LELON Right, MD;  Location: [facility] OR;  Service: Orthopedics;  Laterality: Left;   TRIGGER FINGER RELEASE      Social History   Socioeconomic History   Marital status: Married    Spouse name: [name]   Number of children: 2   Years of education: 18   Highest education level: Master's degree (e.g., MA, MS, MEng, MEd, MSW, MBA)  Occupational History    Comment: Retired   Tobacco Use   Smoking status: Former    Current packs/day: 0.00    Types: Cigarettes    Quit date: 10/12/1979    Years since quitting: 43.4   Smokeless tobacco: Never   Tobacco comments:    quit smoking cigarettes 32 years ago  Vaping Use   Vaping status: Never Used  Substance and Sexual Activity   Alcohol use: Yes    Alcohol/week: 3.0 standard drinks of alcohol    Types: 3 Glasses of wine per week    Comment: social   Drug use: No   Sexual activity: Not on file  Other Topics Concern   Not on file  Social History Narrative   Lives with her husband. She enjoys playing with her dog and walking.   Social Drivers of Corporate Investment Banker Strain: Low Risk  (01/06/2023)   Overall Financial Resource Strain (CARDIA)    Difficulty of Paying Living Expenses: Not hard at all  Food Insecurity: No Food Insecurity (01/06/2023)   Hunger Vital Sign    Worried About Running Out of Food in the Last Year: Never true    Ran Out of Food in the Last Year: Never true  Transportation  Needs: No Transportation Needs (01/06/2023)   PRAPARE - Administrator, Civil Service (Medical): No    Lack of Transportation (Non-Medical): No  Physical Activity: Sufficiently Active (01/06/2023)   Exercise Vital Sign    Days of Exercise per Week: 5 days    Minutes of Exercise per Session: 120 min  Stress: No Stress Concern Present (01/06/2023)   [employer] of Occupational Health - Occupational Stress Questionnaire    Feeling of Stress : Only a little  Social Connections: Socially Integrated (01/06/2023)   Social Connection and Isolation Panel [NHANES]    Frequency of Communication with Friends and Family: More than three times a week    Frequency of Social Gatherings with Friends and Family: Twice a week    Attends Religious Services: More than 4 times per year    Active Member of Clubs or Organizations: Yes    Attends Engineer, Structural: More than 4 times per year     Marital Status: Married    Family History  Problem Relation Age of Onset   Breast cancer Mother    Kidney failure Father    Heart Problems Father    Colon cancer Sister    Lung cancer Brother    Arthritis Other     Health Maintenance  Topic Date Due   COVID-19 Vaccine (8 - 2024-25 season) 03/11/2024 (Originally 02/23/2023)   Colonoscopy  08/27/2023   Medicare Annual Wellness (AWV)  01/06/2024   MAMMOGRAM  03/16/2024   DTaP/Tdap/Td (4 - Td or Tdap) 06/10/2031   Pneumonia Vaccine 25+ Years old  Completed   INFLUENZA VACCINE  Completed   DEXA SCAN  Completed   Hepatitis C Screening  Completed   Zoster Vaccines- Shingrix  Completed   HPV VACCINES  Aged Out     ----------------------------------------------------------------------------------------------------------------------------------------------------------------------------------------------------------------- Physical Exam BP (!) 157/77 (BP Location: Left Arm, Patient Position: Sitting, Cuff Size: Normal)   Pulse (!) 103   Ht 5' 2 (1.575 m)   Wt 145 lb 8 oz (66 kg)   SpO2 98%   BMI 26.61 kg/m   Physical Exam Constitutional:      Appearance: Normal appearance.  Neck:     Comments: Range of motion is decreased with extension and rotational movement.  Mild tenderness to palpation along bilateral rhomboids.  Musculoskeletal:     Cervical back: Neck supple.  Neurological:     Mental Status: She is alert.     ------------------------------------------------------------------------------------------------------------------------------------------------------------------------------------------------------------------- Assessment and Plan  DDD (degenerative disc disease), cervical Feeling pain and spasm of the cervical paraspinals and into the rhomboids.  Adding course of prednisone  as well as tizanidine .  Recommend trying heat to the area as well.  She will let me know if not improving.  We can consider adding  physical therapy if symptoms persist.   Meds ordered this encounter  Medications   predniSONE  (DELTASONE ) 20 MG tablet    Sig: Take 1 tablet (20 mg total) by mouth daily with breakfast for 5 days.    Dispense:  5 tablet    Refill:  0   tiZANidine  (ZANAFLEX ) 4 MG tablet    Sig: Take 1 tablet (4 mg total) by mouth every 6 (six) hours as needed for muscle spasms.    Dispense:  30 tablet    Refill:  0    No follow-ups on file.    This visit occurred during the SARS-CoV-2 public health emergency.  Safety protocols were in place, including screening questions prior to the visit, additional  usage of staff PPE, and extensive cleaning of exam room while observing appropriate contact time as indicated for disinfecting solutions.

## 2023-02-28 ENCOUNTER — Other Ambulatory Visit: Payer: Self-pay | Admitting: Family Medicine

## 2023-02-28 DIAGNOSIS — Z1231 Encounter for screening mammogram for malignant neoplasm of breast: Secondary | ICD-10-CM

## 2023-03-19 ENCOUNTER — Other Ambulatory Visit: Payer: Self-pay | Admitting: Family Medicine

## 2023-03-22 DIAGNOSIS — G4733 Obstructive sleep apnea (adult) (pediatric): Secondary | ICD-10-CM | POA: Diagnosis not present

## 2023-03-24 ENCOUNTER — Other Ambulatory Visit: Payer: Self-pay | Admitting: Family Medicine

## 2023-03-24 MED ORDER — ATORVASTATIN CALCIUM 20 MG PO TABS: ORAL_TABLET | ORAL | 1 refills | Status: DC

## 2023-03-24 NOTE — Telephone Encounter (Signed)
 Copied from CRM [id number]. Topic: Clinical - Medication Refill >> Mar 24, 2023 10:25 AM Carlatta H wrote: Most Recent Primary Care Visit:  Provider: ALVIA BRING  Department: [facility]  Visit Type: ACUTE  Date: 02/24/2023  Medication: atorvastatin  (LIPITOR) 20 MG tablet [[id number]]  Has the patient contacted their pharmacy? No (Agent: If no, request that the patient contact the pharmacy for the refill. If patient does not wish to contact the pharmacy document the reason why and proceed with request.) (Agent: If yes, when and what did the pharmacy advise?)  Is this the correct pharmacy for this prescription? Yes If no, delete pharmacy and type the correct one.  This is the patient's preferred pharmacy:  [id number] [place], [place] - [address] [place] [address] Phone: [phone] Fax: [phone]   Has the prescription been filled recently? No  Is the patient out of the medication? Yes  Has the patient been seen for an appointment in the last year OR does the patient have an upcoming appointment? Yes  Can we respond through MyChart? Yes  Agent: Please be advised that Rx refills may take up to 3 business days. We ask that you follow-up with your pharmacy.

## 2023-03-28 ENCOUNTER — Other Ambulatory Visit: Payer: Self-pay | Admitting: Family Medicine

## 2023-03-30 ENCOUNTER — Other Ambulatory Visit: Payer: Self-pay | Admitting: Family Medicine

## 2023-03-30 NOTE — Telephone Encounter (Signed)
Medication already refilled.

## 2023-03-30 NOTE — Telephone Encounter (Signed)
Copied from CRM [id number]. Topic: Clinical - Medication Refill >> Mar 30, 2023 10:48 AM Higinio Roger wrote: Most Recent Primary Care Visit:  Provider: Everrett Coombe  Department: [facility]  Visit Type: ACUTE  Date: 02/24/2023  Medication: levothyroxine (SYNTHROID) 137 MCG tablet  Has the patient contacted their pharmacy? Yes (Agent: If no, request that the patient contact the pharmacy for the refill. If patient does not wish to contact the pharmacy document the reason why and proceed with request.) (Agent: If yes, when and what did the pharmacy advise?) Pharmacy stated they have been trying to reach Everrett Coombe, DO. They have some questions about refilling the prescription, due to it changing from 150 MCG to 137 MCG.  Is this the correct pharmacy for this prescription? Yes If no, delete pharmacy and type the correct one.  This is the patient's preferred pharmacy:   [id number] [place], [place] - [address] [place] [address] Phone: [phone] Fax: [phone]   Has the prescription been filled recently? No  Is the patient out of the medication? Yes  Has the patient been seen for an appointment in the last year OR does the patient have an upcoming appointment? Yes  Can we respond through MyChart? No. Patient would like callback at: [phone].  Agent: Please be advised that Rx refills may take up to 3 business days. We ask that you follow-up with your pharmacy.

## 2023-03-30 NOTE — Telephone Encounter (Signed)
Last Fill: 03/24/23  Last OV: 02/24/23 Next OV: None Scheduled  Routing to provider for review/authorization.

## 2023-04-01 ENCOUNTER — Other Ambulatory Visit: Payer: Self-pay | Admitting: Family Medicine

## 2023-04-01 NOTE — Telephone Encounter (Signed)
Copied from CRM [id number]. Topic: Clinical - Medication Refill >> Apr 01, 2023  3:41 PM Gery Pray wrote: Most Recent Primary Care Visit:  Provider: Everrett Coombe  Department: [facility]  Visit Type: ACUTE  Date: 02/24/2023  Medication: levothyroxine (SYNTHROID) 137 MCG tablet  Has the patient contacted their pharmacy? Yes (Agent: If no, request that the patient contact the pharmacy for the refill. If patient does not wish to contact the pharmacy document the reason why and proceed with request.) (Agent: If yes, when and what did the pharmacy advise?)  Is this the correct pharmacy for this prescription? Yes If no, delete pharmacy and type the correct one.  This is the patient's preferred pharmacy:  [id number] [place], [place] - [address] [place] [address] Phone: [phone] Fax: [phone]   Has the prescription been filled recently? No  Is the patient out of the medication? Yes  Has the patient been seen for an appointment in the last year OR does the patient have an upcoming appointment? Yes  Can we respond through MyChart? Yes  Agent: Please be advised that Rx refills may take up to 3 business days. We ask that you follow-up with your pharmacy.

## 2023-04-04 ENCOUNTER — Other Ambulatory Visit: Payer: Self-pay | Admitting: Family Medicine

## 2023-04-04 MED ORDER — LEVOTHYROXINE SODIUM 137 MCG PO TABS: 137.0000 ug | ORAL_TABLET | Freq: Every day | ORAL | 1 refills | Status: DC

## 2023-04-04 NOTE — Addendum Note (Signed)
 Addended by: Mammie Lorenzo on: 04/04/2023 12:24 PM   Modules accepted: Orders

## 2023-04-05 NOTE — Telephone Encounter (Signed)
 Pharmacy is requesting permission to change Levothyroxin manufacturers

## 2023-04-06 ENCOUNTER — Ambulatory Visit: Payer: Medicare Other

## 2023-04-06 ENCOUNTER — Encounter: Payer: Self-pay | Admitting: Family Medicine

## 2023-04-06 DIAGNOSIS — R4189 Other symptoms and signs involving cognitive functions and awareness: Secondary | ICD-10-CM

## 2023-04-07 ENCOUNTER — Telehealth: Payer: Self-pay

## 2023-04-07 NOTE — Telephone Encounter (Signed)
 Copied from CRM [id number]. Topic: Clinical - Prescription Issue >> Apr 01, 2023  3:38 PM Gery Pray wrote: Reason for CRM: Patient called in regards to levothyroxine (SYNTHROID) 137 MCG tablet. [facility] and they would like the provider to call to give their approval for the medication refill. Will not give refill until they has spoken to the provider.

## 2023-04-08 NOTE — Telephone Encounter (Signed)
 [facility]  [address] [place] informed that change of mfg has been approved by provider.

## 2023-04-15 ENCOUNTER — Encounter: Payer: Self-pay | Admitting: Family Medicine

## 2023-04-15 ENCOUNTER — Telehealth: Payer: Self-pay

## 2023-04-15 NOTE — Telephone Encounter (Signed)
Ill work on it.

## 2023-04-15 NOTE — Telephone Encounter (Signed)
 Copied from CRM [id number]. Topic: Referral - Request for Referral >> Apr 14, 2023  4:44 PM Epimenio Foot F wrote: Did the patient discuss referral with their provider in the last year? Yes   Appointment offered? No  Type of order/referral and detailed reason for visit: Neurology   Preference of office, provider, location: Any with appointments available soon   If referral order, have you been seen by this specialty before? No (If Yes, this issue or another issue? When? Where?  Can we respond through MyChart? Yes  Patient is requesting her provider give her a call. She would like to speak with him personally.

## 2023-04-18 ENCOUNTER — Encounter: Payer: Self-pay | Admitting: Family Medicine

## 2023-04-18 DIAGNOSIS — L820 Inflamed seborrheic keratosis: Secondary | ICD-10-CM | POA: Diagnosis not present

## 2023-04-18 NOTE — Telephone Encounter (Signed)
 Good morning, I don't see a active referral to send, but  [facility] in [place] can get her in beginning of April.

## 2023-04-19 ENCOUNTER — Other Ambulatory Visit: Payer: Self-pay | Admitting: Family Medicine

## 2023-04-19 DIAGNOSIS — R4189 Other symptoms and signs involving cognitive functions and awareness: Secondary | ICD-10-CM

## 2023-04-21 ENCOUNTER — Ambulatory Visit
Admission: RE | Admit: 2023-04-21 | Discharge: 2023-04-21 | Disposition: A | Discharge status: Home / Self Care | Payer: Medicare Other | Source: Ambulatory Visit | Attending: Family Medicine | Admitting: Family Medicine

## 2023-04-21 DIAGNOSIS — Z1231 Encounter for screening mammogram for malignant neoplasm of breast: Secondary | ICD-10-CM

## 2023-05-09 ENCOUNTER — Telehealth: Payer: Self-pay

## 2023-05-09 NOTE — Telephone Encounter (Unsigned)
 Copied from CRM [id number]. Topic: Clinical - Medical Advice >> May 06, 2023  3:00 PM Gery Pray wrote: Reason for CRM: Patient calling to a clearance for surgery. Please contact patient through her MyChart account. Please fax clearance letter and information on why she would need the surgery to Fax: [phone].

## 2023-05-11 DIAGNOSIS — H35341 Macular cyst, hole, or pseudohole, right eye: Secondary | ICD-10-CM | POA: Diagnosis not present

## 2023-05-11 DIAGNOSIS — H2513 Age-related nuclear cataract, bilateral: Secondary | ICD-10-CM | POA: Diagnosis not present

## 2023-05-11 DIAGNOSIS — H25013 Cortical age-related cataract, bilateral: Secondary | ICD-10-CM | POA: Diagnosis not present

## 2023-05-12 ENCOUNTER — Encounter: Payer: Self-pay | Admitting: Podiatry

## 2023-05-12 ENCOUNTER — Ambulatory Visit (INDEPENDENT_AMBULATORY_CARE_PROVIDER_SITE_OTHER): Payer: Medicare Other | Admitting: Podiatry

## 2023-05-12 DIAGNOSIS — L603 Nail dystrophy: Secondary | ICD-10-CM | POA: Diagnosis not present

## 2023-05-12 NOTE — Telephone Encounter (Signed)
 Spoke to patient. Pt did not need surgical clearance. Pt needed referral sent to new location based on insurance.   Updated referral to Neurology and faxed to [facility] @ [phone].

## 2023-05-12 NOTE — Progress Notes (Signed)
  Subjective:  Patient ID: Stephanie Ramsey, female    DOB: October 02, 1950,   MRN: [id number]  No chief complaint on file.   73 y.o. female presents for follow-up of bilateral total nail avulsion of great toes. Relates doing well with no pain. She was concerned for some thicknening of skin she wanted to check out. . Denies any other pedal complaints. Denies n/v/f/c.   Past Medical History:  Diagnosis Date   Arthritis    Cancer (HCC)    right breast   Carpal tunnel syndrome    Chronic back pain    Complication of anesthesia    "stopped breathing during procedure and dizziness after procedures"   Depression    Hypercholesterolemia    Hypothyroidism    Leg cramps    Osteopenia    Pneumonia    Ulcerative colitis (HCC)     Objective:  Physical Exam: Vascular: DP/PT pulses 2/4 bilateral. CFT <3 seconds. Normal hair growth on digits. No edema.  Skin. No lacerations or abrasions bilateral feet. Bilateral hallux nails have healed well. Mild hyperkeratosis of the nail bed. No return of nail itself.  Musculoskeletal: MMT 5/5 bilateral lower extremities in DF, PF, Inversion and Eversion. Deceased ROM in DF of ankle joint.  Neurological: Sensation intact to light touch.   Assessment:   1. Onychodystrophy       Plan:  Patient was evaluated and treated and all questions answered. Toe was evaluated and appears to be healing well.  May continue soaks  twice a week and keep moisturizer on the toes.  Patient to follow-up as needed.    Louann Sjogren, DPM

## 2023-05-13 ENCOUNTER — Ambulatory Visit (INDEPENDENT_AMBULATORY_CARE_PROVIDER_SITE_OTHER): Admitting: Family Medicine

## 2023-05-13 ENCOUNTER — Encounter: Payer: Self-pay | Admitting: Family Medicine

## 2023-05-13 VITALS — BP 149/78 | HR 67 | Ht 62.0 in | Wt 148.0 lb

## 2023-05-13 DIAGNOSIS — R519 Headache, unspecified: Secondary | ICD-10-CM

## 2023-05-13 NOTE — Patient Instructions (Signed)
 Temporal Arteritis  Temporal arteritis is a condition that makes your arteries become inflamed. It can happen in any part of the body. Most often, it affects your head and face. It is also called giant cell arteritis. This condition can cause severe problems such as blindness. Early treatment can help prevent those problems. What are the causes? The cause of this condition is not known. What increases the risk? You may be more likely to get this condition if: You are older than 74 years of age. You are female. You are Caucasian. You are of [place], [place], [place], [place], or [place] ancestry. You have a family history of the condition. You have polymyalgia rheumatica (PMR). This is a condition that causes muscle pain and stiffness. What are the signs or symptoms? Some people with this condition have just one symptom. Others have more than one symptom. Most symptoms are related to the head and face. These may include: Headache. Hard, swollen, or tender temples. Your temples are the areas on either side of your forehead. Pain when you comb your hair or when you lay your head down. Pain in your jaw when you chew. Pain in your throat or tongue. Problems with your vision. These may include sudden loss of vision in one eye or seeing double. Other symptoms may include: Fever. Feeling tired (fatigue). A dry cough. Pain in your hips and shoulders. Pain in your arms when you exercise. Depression. Weight loss. How is this diagnosed? This condition may be diagnosed based on your symptoms, your medical history, and a physical exam. You may also have tests done. These may include: Blood tests. Biopsy. This is when a small piece of tissue is removed for testing. Imaging tests, such as an ultrasound or MRI. How is this treated? This condition may be treated with medicines to: Reduce inflammation (steroids). Weaken your immune system (immunosuppressants). Your immune system is your body's  defense system. Treat vision problems. Follow these instructions at home: Medicines Take over-the-counter and prescription medicines only as told by your health care provider. Take any vitamins or supplements that your provider recommends. These may include vitamin D and calcium to help stop your bones from getting weak. Eating and drinking  Eat foods that are good for your heart. These may include: Foods high in fiber, such as fresh fruits and vegetables, whole grains, and beans. Foods that have healthy fats in them (omega-3 fats), such as fish, flaxseed, and flaxseed oil. Limit foods that are high in saturated fat and cholesterol. These include processed and fried foods, fatty meat, and full-fat dairy. Limit how much salt (sodium) you eat. Include calcium and vitamin D in your diet. Good sources of these include: Low-fat dairy products, such as milk, yogurt, and cheese. Certain fish, such as fresh or canned salmon, tuna, and sardines. Fortified products. This means that they have calcium and vitamin D added to them. These include fortified cereals and juice. General instructions Exercise. Talk with your provider about what exercises are safe for you. You may be told to do exercises that make your heart beat faster (aerobic exercise). These can help control your blood pressure and prevent bone loss. Stay up-to-date on all of your vaccines as told by your provider. Keep all follow-up visits. The medicines used to treat this condition can make you more likely to have problems such as bone loss or diabetes. Your provider will check for problems during your follow-up visits. Contact a health care provider if: Your symptoms get worse. You have any  signs of infection. These may include fever, swelling, redness, warmth, or tenderness. Get help right away if: You lose your vision. Your pain does not go away, even after you take medicine. You have chest pain. You have trouble breathing. One side  of your face or body gets weak or numb all of a sudden. These symptoms may be an emergency. Get help right away. Call 911. Do not wait to see if the symptoms will go away. Do not drive yourself to the hospital. This information is not intended to replace advice given to you by your health care provider. Make sure you discuss any questions you have with your health care provider. Document Revised: 11/16/2021 Document Reviewed: 11/16/2021 Elsevier Patient Education  2024 [employer].

## 2023-05-13 NOTE — Progress Notes (Signed)
 Stephanie Ramsey - 73 y.o. female MRN [id number]  Date of birth: 1950-09-01  Subjective No chief complaint on file.   HPI Stephanie Ramsey is 73 y.o. female here today with complaint of headaches.  Headaches are located in the L temporal area.  Has radiation from the lateral eye into the temporal area.  This started a few weeks ago.  She did see her ophthalmologist who recommended that she see her PCP due to possibility of Temporal arteritis.  She denies vision changes.  She has not really tried anything to manage headache symptoms.   ROS:  A comprehensive ROS was completed and negative except as noted per HPI  Allergies  Allergen Reactions   Oxycontin [Oxycodone] Swelling and Other (See Comments)    HYPOTENSION   Penicillins Rash and Other (See Comments)    Unknown  Unknown    Propofol     Past Medical History:  Diagnosis Date   Arthritis    Cancer (HCC)    right breast   Carpal tunnel syndrome    Chronic back pain    Complication of anesthesia    "stopped breathing during procedure and dizziness after procedures"   Depression    Hypercholesterolemia    Hypothyroidism    Leg cramps    Osteopenia    Pneumonia    Ulcerative colitis (HCC)     Past Surgical History:  Procedure Laterality Date   BILATERAL CARPAL TUNNEL RELEASE     BREAST BIOPSY Right 2011   In Situ DCIS   BREAST LUMPECTOMY Right 2011   in situ    BREAST SURGERY     biopsy and lumpectomy right breast   CHOLECYSTECTOMY     COLONOSCOPY W/ BIOPSIES AND POLYPECTOMY     JOINT REPLACEMENT     Total right hip   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   Procedure: LEFT TOTAL HIP ARTHROPLASTY;  Surgeon: Valeria Batman, MD;  Location: [facility] OR;  Service: Orthopedics;  Laterality: Left;   TRIGGER FINGER RELEASE      Social History   Socioeconomic History   Marital status: Married    Spouse name: Cristal Deer   Number of children: 2   Years of education: 18   Highest education level: Master's degree (e.g., MA, MS,  MEng, MEd, MSW, MBA)  Occupational History    Comment: Retired  Tobacco Use   Smoking status: Former    Current packs/day: 0.00    Types: Cigarettes    Quit date: 10/12/1979    Years since quitting: 43.6   Smokeless tobacco: Never   Tobacco comments:    quit smoking cigarettes 32 years ago  Vaping Use   Vaping status: Never Used  Substance and Sexual Activity   Alcohol use: Yes    Alcohol/week: 3.0 standard drinks of alcohol    Types: 3 Glasses of wine per week    Comment: social   Drug use: No   Sexual activity: Not on file  Other Topics Concern   Not on file  Social History Narrative   Lives with her husband. She enjoys playing with her dog and walking.   Social Drivers of Corporate investment banker Strain: Low Risk  (01/06/2023)   Overall Financial Resource Strain (CARDIA)    Difficulty of Paying Living Expenses: Not hard at all  Food Insecurity: No Food Insecurity (01/06/2023)   Hunger Vital Sign    Worried About Running Out of Food in the Last Year: Never true    Ran  Out of Food in the Last Year: Never true  Transportation Needs: No Transportation Needs (01/06/2023)   PRAPARE - Administrator, Civil Service (Medical): No    Lack of Transportation (Non-Medical): No  Physical Activity: Sufficiently Active (01/06/2023)   Exercise Vital Sign    Days of Exercise per Week: 5 days    Minutes of Exercise per Session: 120 min  Stress: No Stress Concern Present (01/06/2023)   [employer] of Occupational Health - Occupational Stress Questionnaire    Feeling of Stress : Only a little  Social Connections: Socially Integrated (01/06/2023)   Social Connection and Isolation Panel [NHANES]    Frequency of Communication with Friends and Family: More than three times a week    Frequency of Social Gatherings with Friends and Family: Twice a week    Attends Religious Services: More than 4 times per year    Active Member of Clubs or Organizations: Yes    Attends  Engineer, structural: More than 4 times per year    Marital Status: Married    Family History  Problem Relation Age of Onset   Breast cancer Mother    Kidney failure Father    Heart Problems Father    Colon cancer Sister    Lung cancer Brother    Arthritis Other     Health Maintenance  Topic Date Due   Colonoscopy  08/27/2023   COVID-19 Vaccine (8 - Moderna risk 2024-25 season) 03/11/2024 (Originally 06/28/2023)   Medicare Annual Wellness (AWV)  01/06/2024   MAMMOGRAM  04/20/2025   DTaP/Tdap/Td (4 - Td or Tdap) 06/10/2031   Pneumonia Vaccine 10+ Years old  Completed   INFLUENZA VACCINE  Completed   DEXA SCAN  Completed   Hepatitis C Screening  Completed   Zoster Vaccines- Shingrix  Completed   HPV VACCINES  Aged Out     ----------------------------------------------------------------------------------------------------------------------------------------------------------------------------------------------------------------- Physical Exam There were no vitals taken for this visit.  Physical Exam Constitutional:      Appearance: Normal appearance.  HENT:     Head: Normocephalic and atraumatic.     Comments: TTP over left temporal area.  No notable swelling.     Mouth/Throat:     Mouth: Mucous membranes are dry.  Eyes:     General: No scleral icterus.    Extraocular Movements: Extraocular movements intact.     Pupils: Pupils are equal, round, and reactive to light.  Neurological:     General: No focal deficit present.     Mental Status: She is alert and oriented to person, place, and time.     Cranial Nerves: No cranial nerve deficit.  Psychiatric:        Mood and Affect: Mood normal.        Behavior: Behavior normal.     ------------------------------------------------------------------------------------------------------------------------------------------------------------------------------------------------------------------- Assessment and  Plan  Left temporal headache Checking stat ESR and CRP.  Discussed possibility of temporal arteritis and need for possible biopsy depending on biopsy results. Precautions discussed including reasons to seek emergency care.    No orders of the defined types were placed in this encounter.   No follow-ups on file.    This visit occurred during the SARS-CoV-2 public health emergency.  Safety protocols were in place, including screening questions prior to the visit, additional usage of staff PPE, and extensive cleaning of exam room while observing appropriate contact time as indicated for disinfecting solutions.

## 2023-05-13 NOTE — Assessment & Plan Note (Signed)
 Checking stat ESR and CRP.  Discussed possibility of temporal arteritis and need for possible biopsy depending on biopsy results. Precautions discussed including reasons to seek emergency care.

## 2023-05-14 LAB — C-REACTIVE PROTEIN: CRP: 1 mg/L (ref 0–10)

## 2023-05-14 LAB — SEDIMENTATION RATE: Sed Rate: 2 mm/h (ref 0–40)

## 2023-05-19 ENCOUNTER — Encounter: Payer: Self-pay | Admitting: Family Medicine

## 2023-05-20 ENCOUNTER — Other Ambulatory Visit: Payer: Self-pay | Admitting: Family Medicine

## 2023-05-20 DIAGNOSIS — R519 Headache, unspecified: Secondary | ICD-10-CM

## 2023-05-20 DIAGNOSIS — R4189 Other symptoms and signs involving cognitive functions and awareness: Secondary | ICD-10-CM

## 2023-05-23 DIAGNOSIS — H52223 Regular astigmatism, bilateral: Secondary | ICD-10-CM | POA: Diagnosis not present

## 2023-05-28 ENCOUNTER — Ambulatory Visit

## 2023-05-28 DIAGNOSIS — R4189 Other symptoms and signs involving cognitive functions and awareness: Secondary | ICD-10-CM

## 2023-05-28 DIAGNOSIS — R519 Headache, unspecified: Secondary | ICD-10-CM

## 2023-06-09 NOTE — Telephone Encounter (Signed)
 Called radiology reading room MRI brain changed to STAT read

## 2023-06-10 ENCOUNTER — Encounter: Payer: Self-pay | Admitting: Family Medicine

## 2023-06-10 ENCOUNTER — Other Ambulatory Visit: Payer: Self-pay | Admitting: Family Medicine

## 2023-06-10 DIAGNOSIS — G319 Degenerative disease of nervous system, unspecified: Secondary | ICD-10-CM

## 2023-06-10 DIAGNOSIS — R4189 Other symptoms and signs involving cognitive functions and awareness: Secondary | ICD-10-CM

## 2023-06-15 NOTE — Telephone Encounter (Signed)
 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  This can be seen with some Neurodegenerative diseases.  I would like for you to see neurology for further evaluation of this.    CM  Written by Adela Holter, DO on 06/10/2023  2:13 PM EDT Seen by patient Ignatius Makos on 06/11/2023  7:52 AM

## 2023-06-20 ENCOUNTER — Other Ambulatory Visit: Payer: Self-pay | Admitting: Family Medicine

## 2023-06-20 DIAGNOSIS — M2550 Pain in unspecified joint: Secondary | ICD-10-CM

## 2023-06-20 DIAGNOSIS — M62838 Other muscle spasm: Secondary | ICD-10-CM

## 2023-06-22 ENCOUNTER — Encounter: Payer: Self-pay | Admitting: Psychology

## 2023-06-22 ENCOUNTER — Encounter: Attending: Psychology | Admitting: Psychology

## 2023-06-22 DIAGNOSIS — R4189 Other symptoms and signs involving cognitive functions and awareness: Secondary | ICD-10-CM | POA: Insufficient documentation

## 2023-06-22 NOTE — Progress Notes (Signed)
 NEUROPSYCHOLOGICAL EVALUATION [place]  Physical Medicine and Rehabilitation     Patient: Stephanie Ramsey  MRN: [id number] DOB: Mar 01, 1950  Age: 73 y.o. Sex: female  Race/Ethnicity: White or Caucasian  Years of Education: 18  Collateral Information Source: Nicolina Barrios (Husband)  Referring Provider: Adela Holter, DO  Provider/Clinical Neuropsychologist: Loletta Ripple, PsyD  Date of Service: 06/22/2023 Start Time: 1 PM End Time: 3 PM  Location of Service:  [facility] Physical Medicine & Rehabilitation Department [place]. [facility] [address] [place] [place], [place] [address] Phone: [phone]  Billing Code/Service:            96116/96121  Individuals Present: Patient was seen accompanied by her husband, with her consent, in-person by the provider in the provider's outpatient office. 1 hour and 15 minutes spent in face-to-face clinical interview and remaining 45 minutes was spent in record review, documentation, and testing protocol construction.    PATIENT CONSENT AND CONFIDENTIALITY The patient's understanding of the reason for referral was intact. Discussed limits of confidentiality including, but not limited to, posting of final evaluation report in the patient's electronic medical record for both the patient and for the referring provider and appropriate medical professionals. Patient was given the opportunity to have their questions answered. The neuropsychological evaluation process was discussed with the patient and they consented to proceed with the evaluation.  Consent for Evaluation and Treatment: Signed: Yes Explanation of Privacy Policies: Signed: Yes Discussion of Confidentiality Limits: Yes  REASON FOR REFERRAL: The patient was referred for neuropsychological evaluation by her primary care physician, Dr. Augustus Ledger, due to concerns for cognitive changes and MRI brain imaging findings. The patient was seen on 05/13/23 by  Dr. Augustus Ledger due to focal headaches at the recommendation of her ophthalmologist. The patient was subsequently send for MRI imaging which was completed on 05/28/23 and showed, per records, "No acute intracranial abnormality, but suspicion of disproportionate volume loss in the mesial temporal lobes, left greater than right, raising the possibility of neurodegenerative disease. Mild to moderate for age cerebral white matter signal changes, nonspecific but most commonly due to small chronic small vessel disease."  The patient was subsequently scheduled with Neurology for an appointment in August and also referred for neuropsychological evaluation.   Upon interview, the patient and her husband indicated a desire to undergo the evaluation to aid in identifying the likely cause of the changes and learn how best to help address those changes.   HISTORY OF PRESENTING CONCERNS: The following was obtained from the patient and her spouse via clinical interview.  Cognitive Symptom Onset & Course: Cognitive changes were reported to be gradual in onset. The patient estimated symptoms began six to 12 months ago, and her spouse reported noticing symptoms around six months ago. Course was reported to be progressive.   Current Cognitive Complaints:  Memory:  The patient reported noticing some changes in her short term memory for conversations but denied changes in remembering recent events. Her husband reported observing the inverse (difficulties with recent events but not conversations) and also reported unintentional repeating of statements. He indicated cueing was variably beneficial. Both the patient and her husband denied noticing any changes or problems involving the patient's ability to manage her own medical appointments, medications, or getting lost in familiar places. A somewhat atypically focal memory problem was reported with respect to meals. The patient was reported to have problems recalling ever having had  particularly meals/dishes (husband likes to cook) they have  had together regularly for the last 10 years.   Processing Speed: No significant changes in processing speed.  Attention & Concentration: Aside from sometimes losing train of thought, no notable difficulties were reported with attention and concentration.   Language: The patient reported experiencing difficulties with word-finding within conversational speech that occurs multiple times throughout the day. Her partner denied noticing any paraphasic errors in general. He did note that within the last three months the patient has started, within speech only, incorrectly naming large numerical figures. No difficulties or confusion was noted with writing the numbers or with mental arithmetic (patient continues to manage finances). Receptive language abilities were reportedly unchanged from baseline.  Visual-Spatial: No significant changes in visual spatial abilities.   Executive Functioning: No significant changes in decision making, problem solving, planning, organization, or behavior were endorsed. No significant changes in personality were indicated.   Motor/Sensory Complaints:   Sensory changes:Endorsed slight changes in sense of taste following COVID infection (early 2021) a few years ago. Patient is followed by ophthalmology for an issue involving the retina in her right eye. She utilizes glasses as needed but is otherwise able to see well. Hearing was reported to be fine. The patient reported that she began experiencing an unusual sensation to the side of her left eye around three months ago, and that the sensation is followed by a left temporal headache (the issue which initially led to MRI).   Balance/coordination difficulties:No major concerns. Other motor difficulties:Reduced clarity of handwriting. Denied experiencing tremor. Frequent instances of dizziness/vertigo: Increased frequency of dizziness starting around six to eight months ago,  typically within the context of postural changes.   Emotional and Behavioral Functioning:  Depression: The patient reported experiencing what seem to be seasonal declines in mood (winter) which started within the last few years. She denies and significant history of depressive symptoms. She reported being on Escitalopram  for about a year. She denied any current experiences of frequent low mood, anhedonia, or of other depressive symptomology.  Anxiety: The patient denied any significant difficulties suggestive of clinically significant symptoms of anxiety.  Other: Denied any current or past experiences of hallucination, paranoia, suicidal ideation, homicidal ideation, or indications of psychosis.   Sleep:  Estimated hours obtained each night: 7-9 Difficulties falling asleep: No Difficulties staying asleep: No Feels rested and refreshed upon awakening: Yes   History of obstructive sleep apnea:  Yes (CPAP compliant) History of vivid dreaming:  Excessive movement while asleep:  Instances of acting out her dreams:  Appetite: Unchanged from baseline. Caffeine: 1 cup of coffee per day.   Alcohol Use: 1 to 2 glasses of wine 2-3x per week.  Tobacco Use: Quit smoking around 40 years ago. Smoked 3-4 cigarettes a day for approximately 10 years.  Recreational Substance Use: No.   Level of Functional Independence: The patient is fully independent with basic activities of daily living.  Finances: Manges the finances for the home independently and without issue.    Shopping / Meal Preparation: Able to do, but husband has historically cooked for the home.  Household Maintenance / Chores: Independent and without issue.    Tracking Appointments / Future Obligations: Independent and without issue.  Medication Management: Independent and without issue. Utilizes pill Dance movement psychotherapist.  Driving: Independent and without issue.     Medical History/Record Review: She has been diagnosed with sleep apnea (cPAP  compliant). Blood pressure and cholesterol are reported to be controlled (medication).  History of traumatic brain injury/concussion: None.   History of stroke: None.  History of heart attack: None.    History of cancer/chemotherapy:History of breast cancer. The patient and spouse do not recall her having to undergo chemotherapy.    History of seizure activity: None.     Symptoms of chronic pain: Occasional joint pain (has had hip replacements). The pain is not constant and reportedly "not horrible," but can flare up at times.     Experience of frequent headaches/migraines: As described above; sensation on side of face/head next to eye and then left sided headache. She struggled to describe the sensation next to her eye when it occurs, but stated that it is not painful. She experiences some pain with the headache, but it was not severe. She indicated the events occur "pretty suddenly" and occurs at least twice a week, sometimes in consecutive days.     Past Medical History:  Diagnosis Date   Arthritis    Cancer (HCC)    right breast   Carpal tunnel syndrome    Chronic back pain    Complication of anesthesia    "stopped breathing during procedure and dizziness after procedures"   Depression    Hypercholesterolemia    Hypothyroidism    Leg cramps    Osteopenia    Pneumonia    Ulcerative colitis [facility])    Patient Active Problem List   Diagnosis Date Noted   Left temporal headache 05/13/2023   DDD (degenerative disc disease), cervical 02/27/2023   Need for hepatitis C screening test 01/06/2023   Raynaud's disease without gangrene 09/28/2022   Pain of left heel 09/28/2022   Fall 05/04/2022   Neck pain 05/04/2022   Rib pain on right side 12/24/2021   Chronic intermittent hypoxia with obstructive sleep apnea 08/24/2021   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   Non-restorative sleep 06/29/2021    Snoring 06/29/2021   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   Depression, reactive 06/29/2021   Morning headache 06/16/2021   Essential tremor 06/16/2021   Rib pain on left side 01/12/2021   Left wrist pain 01/12/2021   Pleuritic chest pain 12/16/2020   Tenosynovitis of right wrist 12/03/2020   Abnormal prominence of clavicle 10/20/2020   Lumbar degenerative disc disease 12/10/2019   Benign paroxysmal positional vertigo of right ear 08/16/2019   Depression with anxiety 07/18/2019   Anxiety 08/17/2018   History of adenomatous polyp of colon 01/26/2018   Trigger ring finger of right hand 11/16/2017   Leg cramps 06/02/2016   Encounter for Medicare annual wellness exam 08/11/2015   Hyperlipidemia 07/26/2015   Hypothyroidism 07/26/2015   Ulcerative colitis (HCC) 07/26/2015   Subcutaneous nodules 07/26/2015   Pain in joints 07/26/2015   Osteopenia 07/26/2015   Neutropenia (HCC) 07/26/2015   Neck muscle spasm 07/26/2015   Malignant neoplasm of breast (HCC) 07/26/2015   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  FINDINGS: Brain: Background cerebral volume is within normal limits for age. However, there does seem to be disproportionate volume loss of both mesial temporal lobes greater on the left (series 15, images 16 and 17, series 10, image 9).   No restricted diffusion to suggest acute infarction. No midline shift, mass effect,  evidence of mass lesion, ventriculomegaly, extra-axial collection or acute intracranial hemorrhage. Cervicomedullary junction and pituitary are within normal limits.   Patchy and scattered bilateral cerebral white matter T2 and  FLAIR hyperintensity, most pronounced in the periventricular white matter, anterior deep white matter capsules. No cortical encephalomalacia or chronic cerebral blood products identified. Deep gray matter nuclei, brainstem and cerebellum are within normal limits for age.    IMPRESSION: 1. No acute intracranial abnormality. 2. But suspicion of disproportionate volume loss in the mesial temporal lobes, left greater than right, raising the possibility of Neurodegenerative disease. 3. Mild to moderate for age cerebral white matter signal changes, nonspecific but most commonly due to small chronic small vessel disease."  Family Neurologic/Medical Hx: Reportedly some suspicion of cognitive changes in older sister (2 y.o.), with noted word finding difficulties.  Family History  Problem Relation Age of Onset   Breast cancer Mother    Kidney failure Father    Heart Problems Father    Colon cancer Sister    Lung cancer Brother    Arthritis Other    Medications:  levothyroxine  (SYNTHROID ) 137 MCG tablet sulfaSALAzine  (AZULFIDINE ) 500 MG tablet Multiple Vitamins-Minerals (MULTIVITAMIN WITH MINERALS) tablet Omega-3 Fatty Acids (FISH OIL) 1360 MG CAPS potassium gluconate 595 (99 K) MG TABS tablet atorvastatin  (LIPITOR) 20 MG tablet gabapentin  (NEURONTIN ) 300 MG capsule Escitalopram  - 10mg  D3 - 1,000IU  Zinc - 50mg  Calcium  - 600mg  Ubiquinol - 100mg   Academic/Vocational History: Highest level of educational attainment: Masters Degree. Performed well.   History of developmental delay:No History of grade repetition:No Enrollment in special education courses:No History of LD/ADHD:No indications.  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 Retired in 2018.   Psychosocial: Marital Status: Married for 54 years. Children/Grandchildren: 2 adult children and three grandchildren.  Living Situation: Lives with her spouse and dog.   Daily Activities/Hobbies: Traveling, camping, going for walks, playing with their dog.   Mental Status/Behavioral Observations: The patient was seen on an outpatient basis in the [facility] PM&R office for the clinical interview accompanied by her spouse.  Sensorium/Arousal: Patient was alert and attentive. No hearing or vision problems were appreciated.  Orientation: Full Appearance: Appropriate dress and hygiene.  Behavior:Appropriate for the setting. No disinhibition. Speech/Language: Conversational speech was prosodic and her thoughts were well articulated, but intermittent word-finding pauses were frequent. Receptive language appeared grossly intact. Motor:Ambulated independently and without issue. No tremor observed.  Social Comportment:Very pleasant, friendly, and engaging. Social behavior was appropriate.  Mood: Positive overall  Affect: Largely positive. Some possible tendencies to respond to difficulty with levity.  Thought Process/Content: Coherent, linear, and goal directed.  Ability to Participate in Interview: Intact. The patient readily answered all questions posed and provided detailed responses. The vast majority of information was provided by the patient.  Insight:Good overall. Relatively few instances of incongruence between patient's self report and her husband's reported observations.    SUMMARY / CLINICAL IMPRESSIONS The patient was referred for neuropsychological evaluation by her primary care physician, Dr. Augustus Ledger, due to concerns for cognitive changes and MRI brain imaging findings. The patient was seen on 05/13/23 by Dr. Augustus Ledger due to focal headaches at the recommendation of her ophthalmologist. The patient was subsequently send for MRI imaging which was completed on 05/28/23 and showed, per records, "No acute intracranial abnormality, but suspicion of disproportionate volume loss in the mesial temporal lobes, left greater than right, raising the possibility of  neurodegenerative disease. Mild to moderate for age cerebral white matter signal  changes, nonspecific but most commonly due to small chronic small vessel disease."  The patient was subsequently scheduled with Neurology for an appointment in August and also referred for neuropsychological evaluation. Upon interview, the patient and her husband indicated a desire to undergo the evaluation to aid in identifying the likely cause of the changes and learn how best to help address those changes.   Based on clinical interview, the patient's cognitive changes are reported to primarily involve word-finding and relatively mild declines in short-term memory. Cognitive changes were reported to be gradual in onset. The patient estimated symptoms began six to 12 months ago, and her spouse reported noticing symptoms around six months ago. Course was reported to be progressive. The patient has had no significant changes in her independent functioning and continues to manage the homes finances and drives independently. The most notable change in physical health involved those involving the side of her head / headache, and slight increase in lightheadedness. There were some mild changes in mental health with emergence of seasonal mood changes. No significant psychiatric symptoms were noted at time of visit.  Word-finding difficulties are common with aging, but can also reflect neurological changes and, combined with changes in memory and imaging findings, warrant formal evaluation. Neuropsychological evaluation will assess for any indications of cognitive decline with objective measures and can be of value in differential diagnosis and, as needed, treatment planning. Results may be beneficial for the patient's coming neurology appointment as well.   DISPOSITION / PLAN  The patient has been set up for a formal neuropsychological assessment to objectively assess her cognitive functioning across domains to establish the patient's  cognitive profile. This data, in conjunction with information obtained via clinical interview and medical record review, will help clarify likely etiology and guide treatment recommendations. Once data collection and interpretation have been completed, the findings / diagnosis and recommendations will be reviewed and discussed with the patient during a feedback appointment with the neuropsychologist. Based on the collaborative dialogue with the patient during the feedback, recommendations may be adjusted / tailored as needed. A formal report will be produced and provided to the patient and the referring provider.   Diagnosis: Cognitive Changes    This report was generated using voice recognition software. While this document has been carefully reviewed, transcription errors may be present. I apologize in advance for any inconvenience. Please contact me if further clarification is needed.             Loletta Ripple, PsyD             Neuropsychologist

## 2023-06-24 DIAGNOSIS — K080 Exfoliation of teeth due to systemic causes: Secondary | ICD-10-CM | POA: Diagnosis not present

## 2023-07-01 ENCOUNTER — Telehealth: Payer: Self-pay | Admitting: Gastroenterology

## 2023-07-01 NOTE — Telephone Encounter (Signed)
 Good afternoon Dr. Cherryl Corona,   Patient is requesting to schedule a colonoscopy specifically with you. Patient last had a colonoscopy in 2022 with Dr. Andriette Keeling. Requesting to transfer her care to you due to provider retiring and her husband is a current patient of yours. Patient's previous colonoscopy report is in Epic under Procedure tab for you to review and advise on scheduling.   Thank you.

## 2023-07-12 ENCOUNTER — Encounter: Payer: Self-pay | Admitting: Gastroenterology

## 2023-07-12 ENCOUNTER — Encounter (HOSPITAL_BASED_OUTPATIENT_CLINIC_OR_DEPARTMENT_OTHER)

## 2023-07-12 DIAGNOSIS — R4189 Other symptoms and signs involving cognitive functions and awareness: Secondary | ICD-10-CM | POA: Diagnosis not present

## 2023-07-12 DIAGNOSIS — G3184 Mild cognitive impairment, so stated: Secondary | ICD-10-CM | POA: Diagnosis not present

## 2023-07-12 NOTE — Telephone Encounter (Signed)
 Patient has been scheduled for OV 8/6 and colonoscopy on 8/25. Patient requested OV prior to colonoscopy.

## 2023-07-12 NOTE — Progress Notes (Signed)
 Behavioral Observations:  The patient was oriented to self, time, and most aspects of place (did not provide exact "type" of place). Her hearing and vision were adequate for testing. She ambulated independently and without issue. No hand tremor was noted during testing. Her speech was prosodic, fluent, and well-articulated. She displayed no clear indications of word-finding difficulties in conversational speech and no paraphasic errors were noted. Receptive language appeared intact. The patient's affect was congruent with mood and her mood was largely neutral to positive. The patient was alert and participated in testing as instructed. She was cooperative throughout the session. Her pace was steady. She showed no difficulties with frustration tolerance. The patient's social interactions were unremarkable and consistent with the setting. No frank attentional lapses were appreciated.  Neuropsychology Note  Stephanie Ramsey completed 120 minutes of neuropsychological testing with technician, Josue Ned, BA, under the supervision of Evalene Riff, PsyD., Clinical Neuropsychologist. The patient did not appear overtly distressed by the testing session, per behavioral observation or via self-report to the technician. Rest breaks were offered.   Clinical Decision Making: In considering the patient's current level of functioning, level of presumed impairment, nature of symptoms, emotional and behavioral responses during clinical interview, level of literacy, and observed level of motivation/effort, a battery of tests was selected by Dr. Riff during initial consultation on 06/22/2023. This was communicated to the technician. Communication between the neuropsychologist and technician was ongoing throughout the testing session and changes were made as deemed necessary based on patient performance on testing, technician observations and additional pertinent factors such as those listed above.  Tests  Administered: Automatic Data Edition (BNT-2) Brief Visuospatial Memory Test-Revised (BVMT-R) Clock Drawing Test Controlled Oral Word Association Test (FAS & Animals) Delis-Kaplan Executive Function System (D-KEFS), select subtests Hopkins Verbal Learning Test - Revised (HVLT-R) Repeatable Battery for the Assessment of Neuropsychological Status Update (RBANS) Trail Making Test (TMT; Part A & B) Wechsler Adult Intelligence Scale-Fourth Edition (WAIS-IV), select subtests Wechsler Memory Scale-Fourth Edition (WMS-IV) , select subtests Wechsler Test of Adult Reading (WTAR) Geriatric Depression Scale-Short Form (GDS-SF) Geriatric Anxiety Inventory (GAI)  Results: Note: This summary of test scores accompanies the interpretive report and should not be interpreted by unqualified individuals or in isolation without reference to the report. Test scores are relative to age, gender, and educational history as available and appropriate.   Measurement properties of test scores: IQ, Index, and Standard Scores (SS): Mean = 100; Standard Deviation = 15; Scaled Scores (ss): Mean = 10; Standard Deviation = 3; Z scores (Z): Mean = 0; Standard Deviation = 1; T scores (T); Mean = 50; Standard Deviation = 10  Intellectual/Premorbid Functioning Estimate   Norm Score Percentile Range  Wechsler Test of Adult Reading  SS = 105 63 %ile Average   ATTENTION AND WORKING MEMORY   Norm Score Percentile Range  WAIS-IV          Digit Span  ss = 12 75 %ile High Average   DSF  ss = 10 50 %ile Average   Span:    6      DSB  ss = 11 63 %ile Average   Span:    5      DSS  ss = 12 75 %ile High Average   Span:    6      PROCESSING SPEED    Norm Score Percentile Range  WAIS-IV          Coding  ss = 10 50 %ile Average  LANGUAGE    Norm Score Percentile Range  Boston Naming Test (BNT-2)  t = 16 <0.1 %ile Exceptionally Low  COWAT          FAS  t = 30 2 %ile Below Average   Animals  t = 13 <0.1 %ile  Exceptionally Low   EXECUTIVE FUNCTIONING    Norm Score Percentile Range  DKEFS - Color-Word Interference          Color Naming  ss = 5 5 %ile Below Average   Word Reading  ss = 9 37 %ile Average   Inhibition  ss =   %ile Discontinued (unable to complete practice trial)   Errors  ss =   %ile    Inhibition Switching  ss =   %ile Discontinued on condition 3   Errors  ss =   %ile   Trails A  t = 37 9 %ile Low Average   Errors    0     Trails B  t = 29 2 %ile Exceptionally Low   Errors    1        MEMORY    Norm Score Percentile Range  BVMT-R          Trial 1  t = 36 8 %ile Below Average   Trial 2  t = 30 2 %ile Below Average   Trial 3  t = 20 <0.1 %ile Exceptionally Low   Total Recall  t = 26 <0.1 %ile Exceptionally Low   Learning  t = 35 7 %ile Below Average   Delayed Recall  t = 29.0 2 %ile Exceptionally Low   % Retained    100 >16 %ile WNL   Hits     >16 %ile WNL   False Alarms     >16 %ile WNL   Recognition Discriminability     >16 %ile WNL  HVLT          Total Recall  t = 39.0 13 %ile Low Average   Delayed Recall  t = 25 1 %ile Exceptionally Low   %Retention  t = <20 <0.1 %ile Exceptionally Low   Recognition Discriminability  t = 53.0 61 %ile Average  Wechsler Memory Scale, 4th Edition (WMS-4)         Log. Mem. Immediate Recall  ss = 6 9 %ile Low Average   Logical Memory Delayed Recall  ss = 8 25 %ile Average   Logical Recognition    51st-75th  %ile Average   VISUAL-SPATIAL    Norm Score Percentile Range  Clock                   RBANS Visuospatial Index          RBANS Figure Copy  ss = 7 16 %ile Low Average   RBANS Line Orientation      51st-75th   %ile Average to High Average   PERSONALITY AND BEHAVIORAL FUNCTIONING     Score/Interpretation  GDS-SF Raw       2  GDS-SF Severity       Minimal.  GAI Raw       1  GAI Severity       Minimal.    Feedback to Patient: Stephanie Ramsey will return on 07/28/2023 for an interactive feedback session with Dr. Hayden at which time  her test performances, clinical impressions and treatment recommendations will be reviewed in detail. The patient understands she can contact our office should  she require our assistance before this time.  120 minutes spent face-to-face with patient administering standardized tests, 30 minutes spent scoring Radiographer, therapeutic). [CPT [id number], [id number]]  Full report to follow.

## 2023-07-18 ENCOUNTER — Encounter: Attending: Psychology | Admitting: Psychology

## 2023-07-18 DIAGNOSIS — G3184 Mild cognitive impairment, so stated: Secondary | ICD-10-CM | POA: Diagnosis not present

## 2023-07-18 DIAGNOSIS — R4189 Other symptoms and signs involving cognitive functions and awareness: Secondary | ICD-10-CM | POA: Insufficient documentation

## 2023-07-26 ENCOUNTER — Ambulatory Visit: Admitting: Psychology

## 2023-07-28 ENCOUNTER — Encounter (HOSPITAL_BASED_OUTPATIENT_CLINIC_OR_DEPARTMENT_OTHER): Admitting: Psychology

## 2023-07-28 DIAGNOSIS — R4189 Other symptoms and signs involving cognitive functions and awareness: Secondary | ICD-10-CM | POA: Diagnosis not present

## 2023-07-28 DIAGNOSIS — G3184 Mild cognitive impairment, so stated: Secondary | ICD-10-CM

## 2023-08-01 DIAGNOSIS — L821 Other seborrheic keratosis: Secondary | ICD-10-CM | POA: Diagnosis not present

## 2023-08-01 DIAGNOSIS — D1801 Hemangioma of skin and subcutaneous tissue: Secondary | ICD-10-CM | POA: Diagnosis not present

## 2023-08-01 DIAGNOSIS — L814 Other melanin hyperpigmentation: Secondary | ICD-10-CM | POA: Diagnosis not present

## 2023-08-01 DIAGNOSIS — L570 Actinic keratosis: Secondary | ICD-10-CM | POA: Diagnosis not present

## 2023-08-01 DIAGNOSIS — L578 Other skin changes due to chronic exposure to nonionizing radiation: Secondary | ICD-10-CM | POA: Diagnosis not present

## 2023-08-22 ENCOUNTER — Ambulatory Visit: Payer: Self-pay

## 2023-08-22 NOTE — Telephone Encounter (Signed)
 FYI Only or Action Required?: FYI only for provider.  Patient was last seen in primary care on 05/13/2023 by Alvia Bring, DO. Called Nurse Triage reporting Tick Removal. Symptoms began several days ago. Interventions attempted: OTC medications: ABX ointment. Symptoms are: pink itchy area - might be swollen unchanged.  Triage Disposition: Call PCP Within 24 Hours  Patient/caregiver understands and will follow disposition?: Yes                 Copied from CRM [id number]. Topic: Clinical - Red Word Triage >> Aug 22, 2023 12:20 PM Carmell R wrote: Red Word that prompted transfer to Nurse Triage: Possibly bitten by a tick underneath right arm. Its swelling, painful, and itching. Happened a couple days ago. Reason for Disposition  [1] Probable deer tick AND [2] attached 36 hours or more (or tick appears swollen, not flat) AND [3] occurred in an area where Lyme disease is common  Answer Assessment - Initial Assessment Questions 1. ATTACHED:  Is the tick still on the skin?  (e.g., yes, no, unsure)     no 2. ONSET - TICK STILL ATTACHED:  How long do you think the tick has been on your skin? (e.g., hours, days, unsure)  Note:  Is there a recent activity (camping, hiking) where the caller may have been exposed?     2 days 3. ONSET - TICK NOT STILL ATTACHED: If the tick has been removed, how long do you think the tick was attached before you removed it? (e.g., 5 hours, 2 days). When was this?     unsure 4. LOCATION: Where is the tick bite located? (e.g., arm, leg)     Under right arm 5. TYPE of TICK: Is it a wood tick or a deer tick? (e.g., deer tick, wood tick; unsure)     unsure 6. SIZE of TICK: How big is the tick? (e.g., size of poppy seed, apple seed, watermelon seed; unsure) Note: Deer ticks can be the size of a poppy seed (nymph) or an apple seed (adult).       tiny 7. ENGORGED: Did the tick look flat or engorged (full, swollen)? (e.g., flat, engorged; unsure)      unsure 8. OTHER SYMPTOMS: Do you have any other symptoms? (e.g., fever, rash, redness at bite area, red ring around bite)     Pinkish, swollen, itchy  Protocols used: Tick Bite-A-AH

## 2023-08-23 ENCOUNTER — Ambulatory Visit (INDEPENDENT_AMBULATORY_CARE_PROVIDER_SITE_OTHER): Admitting: Family Medicine

## 2023-08-23 ENCOUNTER — Encounter: Payer: Self-pay | Admitting: Family Medicine

## 2023-08-23 VITALS — BP 150/72 | HR 58 | Ht 62.0 in | Wt 142.0 lb

## 2023-08-23 DIAGNOSIS — S2096XA Insect bite (nonvenomous) of unspecified parts of thorax, initial encounter: Secondary | ICD-10-CM

## 2023-08-23 DIAGNOSIS — W57XXXA Bitten or stung by nonvenomous insect and other nonvenomous arthropods, initial encounter: Secondary | ICD-10-CM | POA: Diagnosis not present

## 2023-08-23 MED ORDER — DOXYCYCLINE HYCLATE 100 MG PO TABS: 100.0000 mg | ORAL_TABLET | Freq: Two times a day (BID) | ORAL | 0 refills | Status: AC

## 2023-08-23 NOTE — Assessment & Plan Note (Signed)
 No longer attached but was attached for prolonged period.  Will cover with 7 day course of doxycycline .  Red flags and handout reviewed.

## 2023-08-23 NOTE — Patient Instructions (Signed)
Tick Bite Information, Adult  Ticks are insects that can bite. Most ticks live in bushes and grassy areas. They climb onto people and animals that go by. Then they bite. Some ticks carry germs that can make you sick. How can I prevent tick bites? Take these steps: Before you go outside: Wear long sleeves and long pants. Wear light-colored clothes. Tuck your pant legs into your socks. Use an insect repellent that has 20% or higher of the ingredients DEET, picaridin, or IR3535. Follow the instructions on the label. Put it on: Bare skin. Avoid your eyes and mouth areas. The tops of your boots. Your pant legs. The ends of your sleeves. If you use an insect repellent that has the ingredient permethrin, follow the instructions on the label. Do not put permethrin on the skin. Put it on: Clothing. Shoes. Outdoor gear. Tents. When you are outside Avoid walking through long grass. Stay in the middle of the trail. Do not touch the bushes. Check for ticks on your clothes, hair, and skin often while you are outside. Check again before you go inside. When you go indoors Check your clothes for ticks. Dry your clothes in a dryer on high heat for 10 minutes or more. If clothes are damp, more time may be needed. Wash your clothes right away if they need to be washed. Use hot water. Check your pets and outdoor gear. Shower right away. Check your body for ticks. Do a full body check using a mirror. Check your clothes, skin, head, neck, armpits, waist, groin, and joint areas. What is the right way to remove a tick? Remove the tick from your skin as soon as possible. Do not remove the tick with your bare fingers. Do not try to remove a tick with heat, alcohol, petroleum jelly, or fingernail polish. To remove a tick that is crawling on your skin: Go outside and brush the tick off. Use tape or a lint roller. To remove a tick that is biting: Wash your hands. If you have gloves, put them on. Use tweezers,  curved forceps, or a tick-removal tool to grasp the tick. Grasp the tick as close to your skin and as close to the tick's head as possible. Gently pull up until the tick lets go. Try to keep the tick's head attached to its body. Do not twist or jerk the tick. Do not squeeze or crush the tick. What should I do after taking out a tick? Clean the bite area and your hands with soap and water, rubbing alcohol, or an iodine wash. If you have an antiseptic cream or ointment, put a small amount on the bite area. Wash and disinfect any instruments that you used to remove the tick. How should I get rid of a live tick? To get rid of a live tick, use one of these methods: Place the tick in rubbing alcohol. Place it in a bag or container you can close tightly. Throw it away. Wrap it tightly in tape. Throw it away. Flush it down the toilet. Where to find more information Centers for Disease Control and Prevention: cdc.gov/ticks U.S. Environmental Protection Agency: epa.gov/insect-repellents Contact a doctor if: You have a fever or chills. You have a red rash that makes a circle (bull's-eye rash) in the bite area. You have redness and swelling where the tick bit you. You have a headache or stiff neck. You have pain in a muscle, joint, or bone. You are more tired than normal. You have trouble walking or   moving your legs. You have numbness in your legs. You have tender or swollen lymph glands. You have belly (abdominal) pain, vomiting, watery poop (diarrhea), or weight loss. Get help right away if: You cannot remove a tick. You cannot move (have paralysis) or feel weak. You are feeling worse or have new symptoms. You find a tick that is biting you and filled with blood, especially if you are in an area where diseases from ticks are common. Summary Ticks may carry germs that can make you sick. To prevent tick bites wear long sleeves, long pants, and light colors. Use insect repellent. Follow the  instructions on the label. If the tick is biting, remove it right away. Do not try to remove it with heat, alcohol, petroleum jelly, or fingernail polish. Contact a doctor if you have symptoms of a disease after being bitten by a tick. This information is not intended to replace advice given to you by your health care provider. Make sure you discuss any questions you have with your health care provider. Document Revised: 05/04/2021 Document Reviewed: 05/04/2021 Elsevier Patient Education  2024 Elsevier Inc.  

## 2023-08-23 NOTE — Progress Notes (Signed)
 Stephanie Ramsey - 73 y.o. female MRN [id number]  Date of birth: [birth year]  Subjective Chief Complaint  Patient presents with   Tick Removal    HPI Stephanie Ramsey is a 73 y.o. female here today with complaint of tick bite.  Reports noticing tick attached to R rib area a few days ago.  It was attached for a couple of days and she is unsure if it is still attached.  Area is itchy with a little pain.  She denies fever, chills, or flu-like symptoms.   ROS:  A comprehensive ROS was completed and negative except as noted per HPI  Allergies  Allergen Reactions   Oxycontin  [Oxycodone ] Swelling and Other (See Comments)    HYPOTENSION   Penicillins Rash and Other (See Comments)    Unknown  Unknown    Propofol      Past Medical History:  Diagnosis Date   Arthritis    Cancer (HCC)    right breast   Carpal tunnel syndrome    Chronic back pain    Complication of anesthesia    stopped breathing during procedure and dizziness after procedures   Depression    Hypercholesterolemia    Hypothyroidism    Leg cramps    Osteopenia    Pneumonia    Ulcerative colitis (HCC)     Past Surgical History:  Procedure Laterality Date   BILATERAL CARPAL TUNNEL RELEASE     BREAST BIOPSY Right 2011   In Situ DCIS   BREAST LUMPECTOMY Right 2011   in situ    BREAST SURGERY     biopsy and lumpectomy right breast   CHOLECYSTECTOMY     COLONOSCOPY W/ BIOPSIES AND POLYPECTOMY     JOINT REPLACEMENT     Total right hip   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   Procedure: LEFT TOTAL HIP ARTHROPLASTY;  Surgeon: Maude LELON Right, MD;  Location: [facility] OR;  Service: Orthopedics;  Laterality: Left;   TRIGGER FINGER RELEASE      Social History   Socioeconomic History   Marital status: Married    Spouse name: [name]   Number of children: 2   Years of education: 18   Highest education level: Master's degree (e.g., MA, MS, MEng, MEd, MSW, MBA)  Occupational History    Comment: Retired  Tobacco Use    Smoking status: Former    Current packs/day: 0.00    Types: Cigarettes    Quit date: 10/12/1979    Years since quitting: 43.8   Smokeless tobacco: Never   Tobacco comments:    quit smoking cigarettes 32 years ago  Vaping Use   Vaping status: Never Used  Substance and Sexual Activity   Alcohol use: Yes    Alcohol/week: 3.0 standard drinks of alcohol    Types: 3 Glasses of wine per week    Comment: social   Drug use: No   Sexual activity: Not on file  Other Topics Concern   Not on file  Social History Narrative   Lives with her husband. She enjoys playing with her dog and walking.   Social Drivers of Corporate investment banker Strain: Low Risk  (01/06/2023)   Overall Financial Resource Strain (CARDIA)    Difficulty of Paying Living Expenses: Not hard at all  Food Insecurity: No Food Insecurity (01/06/2023)   Hunger Vital Sign    Worried About Running Out of Food in the Last Year: Never true    Ran Out of Food in the Last Year:  Never true  Transportation Needs: No Transportation Needs (01/06/2023)   PRAPARE - Administrator, Civil Service (Medical): No    Lack of Transportation (Non-Medical): No  Physical Activity: Sufficiently Active (01/06/2023)   Exercise Vital Sign    Days of Exercise per Week: 5 days    Minutes of Exercise per Session: 120 min  Stress: No Stress Concern Present (01/06/2023)   [employer] of Occupational Health - Occupational Stress Questionnaire    Feeling of Stress : Only a little  Social Connections: Socially Integrated (01/06/2023)   Social Connection and Isolation Panel    Frequency of Communication with Friends and Family: More than three times a week    Frequency of Social Gatherings with Friends and Family: Twice a week    Attends Religious Services: More than 4 times per year    Active Member of Clubs or Organizations: Yes    Attends Engineer, structural: More than 4 times per year    Marital Status: Married     Family History  Problem Relation Age of Onset   Breast cancer Mother    Kidney failure Father    Heart Problems Father    Colon cancer Sister    Lung cancer Brother    Arthritis Other     Health Maintenance  Topic Date Due   Colonoscopy  08/27/2023   COVID-19 Vaccine (8 - Moderna risk 2024-25 season) 03/11/2024 (Originally 06/28/2023)   INFLUENZA VACCINE  09/16/2023   Medicare Annual Wellness (AWV)  01/06/2024   MAMMOGRAM  04/20/2025   DTaP/Tdap/Td (4 - Td or Tdap) 06/10/2031   Pneumococcal Vaccine: 50+ Years  Completed   DEXA SCAN  Completed   Hepatitis C Screening  Completed   Zoster Vaccines- Shingrix  Completed   Hepatitis B Vaccines  Aged Out   HPV VACCINES  Aged Out   Meningococcal B Vaccine  Aged Out     ----------------------------------------------------------------------------------------------------------------------------------------------------------------------------------------------------------------- Physical Exam BP (!) 150/72 (BP Location: Left Arm, Patient Position: Sitting, Cuff Size: Normal)   Pulse (!) 58   Ht 5' 2 (1.575 m)   Wt 142 lb (64.4 kg)   SpO2 97%   BMI 25.97 kg/m   Physical Exam Constitutional:      Appearance: Normal appearance.  Eyes:     General: No scleral icterus. Cardiovascular:     Rate and Rhythm: Normal rate and regular rhythm.  Pulmonary:     Effort: Pulmonary effort is normal.     Breath sounds: Normal breath sounds.  Skin:    Comments: Erythematous papule over R side of ribs.    Neurological:     General: No focal deficit present.     Mental Status: She is alert.  Psychiatric:        Mood and Affect: Mood normal.        Behavior: Behavior normal.     ------------------------------------------------------------------------------------------------------------------------------------------------------------------------------------------------------------------- Assessment and Plan  Tick bite No longer  attached but was attached for prolonged period.  Will cover with 7 day course of doxycycline .  Red flags and handout reviewed.    Meds ordered this encounter  Medications   doxycycline  (VIBRA -TABS) 100 MG tablet    Sig: Take 1 tablet (100 mg total) by mouth 2 (two) times daily for 7 days.    Dispense:  14 tablet    Refill:  0    No follow-ups on file.

## 2023-08-30 ENCOUNTER — Telehealth: Payer: Self-pay | Admitting: Family Medicine

## 2023-08-30 DIAGNOSIS — Z1211 Encounter for screening for malignant neoplasm of colon: Secondary | ICD-10-CM

## 2023-08-30 DIAGNOSIS — Z860101 Personal history of adenomatous and serrated colon polyps: Secondary | ICD-10-CM

## 2023-08-30 DIAGNOSIS — K51918 Ulcerative colitis, unspecified with other complication: Secondary | ICD-10-CM

## 2023-08-30 NOTE — Telephone Encounter (Signed)
 Pt stopped in to ask for a referral to [facility] in [place].

## 2023-09-12 ENCOUNTER — Telehealth: Payer: Self-pay | Admitting: Neurology

## 2023-09-12 NOTE — Telephone Encounter (Signed)
 Please contact pt back and advise that that is fine. Patients can bring two people with them to visit.

## 2023-09-12 NOTE — Telephone Encounter (Signed)
 Patient asking if can bring two people to her appointment: husband and good friend. The good friend may be helping with care.Would like a call back.

## 2023-09-12 NOTE — Telephone Encounter (Signed)
 Pt was informed and verbalized appreciation.

## 2023-09-13 NOTE — Progress Notes (Signed)
 NEUROPSYCHOLOGICAL EVALUATION [place]. [facility]  Physical Medicine and Rehabilitation     Patient: Stephanie Ramsey  MRN: [id number] DOB: 1950-02-21  Age: 73 y.o. Sex: female  Race/Ethnicity: White or Caucasian  Years of Education: 18  Collateral Information Source: [place] (Husband)  Referring Provider: Alvia Bring, DO  Provider/Clinical Neuropsychologist: Evalene DOROTHA Riff, PsyD  Date of Service: 07/18/23 Start Time: 11 AM End Time: 12 PM  Location of Service:  [facility] Physical Medicine & Rehabilitation Department . [facility] [address], [place] [place], [place] [address] Phone: [phone]  Billing Code/Service: [phone]  Individuals Present: Evalene Riff, PsyD 1 hour was spent on interpretation of patient data, interpretation of standardized test results and clinical data, clinical decision making, initial treatment planning/recommendations, and report writing. The report will be amended as needed based on any additional information collected during interactive feedback session.   REASON FOR REFERRAL: The patient was referred for neuropsychological evaluation by her primary care physician, Dr. Alvia, due to concerns for cognitive changes and MRI brain imaging findings. The patient was seen on 05/13/23 by Dr. Alvia due to focal headaches at the recommendation of her ophthalmologist. The patient was subsequently send for MRI imaging which was completed on 05/28/23 and showed, per records, No acute intracranial abnormality, but suspicion of disproportionate volume loss in the mesial temporal lobes, left greater than right, raising the possibility of neurodegenerative disease. Mild to moderate for age cerebral white matter signal changes, nonspecific but most commonly due to small chronic small vessel disease.  The patient was subsequently scheduled with Neurology for an appointment in August and also referred for neuropsychological  evaluation.   Upon interview, the patient and her husband indicated a desire to undergo the evaluation to aid in identifying the likely cause of the changes and learn how best to help address those changes.   HISTORY OF PRESENTING CONCERNS: The following was obtained from the patient and her spouse via clinical interview.  Cognitive Symptom Onset & Course: Cognitive changes were reported to be gradual in onset. The patient estimated symptoms began six to 12 months ago, and her spouse reported noticing symptoms around six months ago. Course was reported to be progressive.   Current Cognitive Complaints:  Memory:  The patient reported noticing some changes in her short term memory for conversations but denied changes in remembering recent events. Her husband reported observing the inverse (difficulties with recent events but not conversations) and also reported unintentional repeating of statements. He indicated cueing was variably beneficial. Both the patient and her husband denied noticing any changes or problems involving the patient's ability to manage her own medical appointments, medications, or getting lost in familiar places. A somewhat atypically focal memory problem was reported with respect to meals. The patient was reported to have problems recalling ever having had particularly meals/dishes (husband likes to cook) they have had together regularly for the last 10 years.   Processing Speed: No significant changes in processing speed.  Attention & Concentration: Aside from sometimes losing train of thought, no notable difficulties were reported with attention and concentration.   Language: The patient reported experiencing difficulties with word-finding within conversational speech that occurs multiple times throughout the day. Her partner denied noticing any paraphasic errors in general. He did note that within the last three months the patient has started, within speech only, incorrectly naming  large numerical figures. No difficulties or confusion was noted with writing the numbers or with mental arithmetic (patient  continues to manage finances). Receptive language abilities were reportedly unchanged from baseline.  Visual-Spatial: No significant changes in visual spatial abilities.   Executive Functioning: No significant changes in decision making, problem solving, planning, organization, or behavior were endorsed. No significant changes in personality were indicated.   Motor/Sensory Complaints:   Sensory changes:Endorsed slight changes in sense of taste following COVID infection (early 2021) a few years ago. Patient is followed by ophthalmology for an issue involving the retina in her right eye. She utilizes glasses as needed but is otherwise able to see well. Hearing was reported to be fine. The patient reported that she began experiencing an unusual sensation to the side of her left eye around three months ago, and that the sensation is followed by a left temporal headache (the issue which initially led to MRI).   Balance/coordination difficulties:No major concerns. Other motor difficulties:Reduced clarity of handwriting. Denied experiencing tremor. Frequent instances of dizziness/vertigo: Increased frequency of dizziness starting around six to eight months ago, typically within the context of postural changes.   Emotional and Behavioral Functioning:  Depression: The patient reported experiencing what seem to be seasonal declines in mood (winter) which started within the last few years. She denies and significant history of depressive symptoms. She reported being on Escitalopram  for about a year. She denied any current experiences of frequent low mood, anhedonia, or of other depressive symptomology.  Anxiety: The patient denied any significant difficulties suggestive of clinically significant symptoms of anxiety.  Other: Denied any current or past experiences of hallucination, paranoia,  suicidal ideation, homicidal ideation, or indications of psychosis.   Sleep:  Estimated hours obtained each night: 7-9 Difficulties falling asleep: No Difficulties staying asleep: No Feels rested and refreshed upon awakening: Yes   History of obstructive sleep apnea:  Yes (CPAP compliant) History of vivid dreaming:  Excessive movement while asleep:  Instances of acting out her dreams:  Appetite: Unchanged from baseline. Caffeine: 1 cup of coffee per day.   Alcohol Use: 1 to 2 glasses of wine 2-3x per week.  Tobacco Use: Quit smoking around 40 years ago. Smoked 3-4 cigarettes a day for approximately 10 years.  Recreational Substance Use: No.   Level of Functional Independence: The patient is fully independent with basic activities of daily living.  Finances: Manges the finances for the home independently and without issue.    Shopping / Meal Preparation: Able to do, but husband has historically cooked for the home.  Household Maintenance / Chores: Independent and without issue.    Tracking Appointments / Future Obligations: Independent and without issue.  Medication Management: Independent and without issue. Utilizes pill Dance movement psychotherapist.  Driving: Independent and without issue.     Medical History/Record Review: She has been diagnosed with sleep apnea (cPAP compliant). Blood pressure and cholesterol are reported to be controlled (medication).  History of traumatic brain injury/concussion: None.   History of stroke: None.   History of heart attack: None.    History of cancer/chemotherapy:History of breast cancer. The patient and spouse do not recall her having to undergo chemotherapy.    History of seizure activity: None.     Symptoms of chronic pain: Occasional joint pain (has had hip replacements). The pain is not constant and reportedly not horrible, but can flare up at times.     Experience of frequent headaches/migraines: As described above; sensation on side of face/head next to eye  and then left sided headache. She struggled to describe the sensation next to her eye when it occurs,  but stated that it is not painful. She experiences some pain with the headache, but it was not severe. She indicated the events occur pretty suddenly and occurs at least twice a week, sometimes in consecutive days.     Past Medical History:  Diagnosis Date   Arthritis    Cancer [facility])    right breast   Carpal tunnel syndrome    Chronic back pain    Complication of anesthesia    stopped breathing during procedure and dizziness after procedures   Depression    Hypercholesterolemia    Hypothyroidism    Leg cramps    Osteopenia    Pneumonia    Ulcerative colitis [facility])    Patient Active Problem List   Diagnosis Date Noted   Tick bite 08/23/2023   Left temporal headache 05/13/2023   DDD (degenerative disc disease), cervical 02/27/2023   Need for hepatitis C screening test 01/06/2023   Raynaud's disease without gangrene 09/28/2022   Pain of left heel 09/28/2022   Fall 05/04/2022   Neck pain 05/04/2022   Rib pain on right side 12/24/2021   Chronic intermittent hypoxia with obstructive sleep apnea 08/24/2021   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   Non-restorative sleep 06/29/2021   Snoring 06/29/2021   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   Morning headache 06/16/2021   Essential tremor 06/16/2021   Rib pain on left side 01/12/2021   Left wrist pain 01/12/2021   Pleuritic chest pain 12/16/2020   Tenosynovitis of right wrist 12/03/2020   Abnormal prominence of clavicle 10/20/2020   Lumbar degenerative disc disease 12/10/2019   Benign paroxysmal positional vertigo of right ear 08/16/2019   History of adenomatous polyp of colon 01/26/2018   Trigger ring finger of right hand 11/16/2017   Leg cramps 06/02/2016   Encounter for Medicare annual wellness exam  08/11/2015   Hyperlipidemia 07/26/2015   Hypothyroidism 07/26/2015   Ulcerative colitis (HCC) 07/26/2015   Subcutaneous nodules 07/26/2015   Pain in joints 07/26/2015   Osteopenia 07/26/2015   Neutropenia (HCC) 07/26/2015   Neck muscle spasm 07/26/2015   Malignant neoplasm of breast (HCC) 07/26/2015   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  FINDINGS: Brain: Background cerebral volume is within normal limits for age. However, there does seem to be disproportionate volume loss of both mesial temporal lobes greater on the left (series 15, images 16 and 17, series 10, image 9).   No restricted diffusion to suggest acute infarction. No midline shift, mass effect, evidence of mass lesion, ventriculomegaly, extra-axial collection or acute intracranial hemorrhage. Cervicomedullary junction and pituitary are within normal limits.   Patchy and scattered bilateral cerebral white matter T2 and FLAIR hyperintensity, most pronounced in the periventricular white matter, anterior deep white matter capsules. No cortical encephalomalacia or chronic cerebral blood products identified. Deep gray matter nuclei, brainstem and cerebellum are within normal limits for age.    IMPRESSION: 1. No acute intracranial abnormality. 2. But suspicion of disproportionate volume loss in the mesial temporal lobes, left greater than right, raising the possibility of Neurodegenerative disease. 3. Mild to moderate for age cerebral white matter signal changes, nonspecific but most  commonly due to small chronic small vessel disease.  Family Neurologic/Medical Hx: Reportedly some suspicion of cognitive changes in older sister (6  y.o.), with noted word finding difficulties.  Family History  Problem Relation Age of Onset   Breast cancer Mother    Kidney failure Father    Heart Problems Father    Colon cancer Sister    Lung cancer Brother    Arthritis Other    Medications:  levothyroxine  (SYNTHROID ) 137 MCG tablet sulfaSALAzine  (AZULFIDINE ) 500 MG tablet Multiple Vitamins-Minerals (MULTIVITAMIN WITH MINERALS) tablet Omega-3 Fatty Acids (FISH OIL) 1360 MG CAPS potassium gluconate 595 (99 K) MG TABS tablet atorvastatin  (LIPITOR) 20 MG tablet gabapentin  (NEURONTIN ) 300 MG capsule Escitalopram  - 10mg  D3 - 1,000IU  Zinc - 50mg  Calcium  - 600mg  Ubiquinol - 100mg   Academic/Vocational History: Highest level of educational attainment: Masters Degree. Performed well.   History of developmental delay:No History of grade repetition:No Enrollment in special education courses:No History of LD/ADHD:No indications.  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 Retired in 2018.   Psychosocial: Marital Status: Married for 54 years. Children/Grandchildren: 2 adult children and three grandchildren.  Living Situation: Lives with her spouse and dog.  Daily Activities/Hobbies: Traveling, camping, going for walks, playing with their dog.   NEUROPSYCHODIAGNOSTIC FINDINGS:  Behavioral Observations: The patient was oriented to self, time, and most aspects of place (did not provide exact "type" of place). Her hearing and vision were adequate for testing. She ambulated independently and without issue. No hand tremor was noted during testing. Her speech was prosodic, fluent, and well-articulated. She displayed no clear indications of word-finding difficulties in conversational speech and no paraphasic errors were noted. Receptive language appeared intact. The patient's affect was congruent with mood and her mood was largely neutral to positive. The patient was alert and  participated in testing as instructed. She was cooperative throughout the session. Her pace was steady. She showed no difficulties with frustration tolerance. The patient's social interactions were unremarkable and consistent with the setting. No frank attentional lapses were appreciated.  Tests Administered: Automatic Data Edition (BNT-2) Brief Visuospatial Memory Test-Revised (BVMT-R) Clock Drawing Test Controlled Oral Word Association Test (FAS & Animals) Delis-Kaplan Executive Function System (D-KEFS), select subtests Hopkins Verbal Learning Test - Revised (HVLT-R) Repeatable Battery for the Assessment of Neuropsychological Status Update (RBANS) Trail Making Test (TMT; Part A & B) Wechsler Adult Intelligence Scale-Fourth Edition (WAIS-IV), select subtests Wechsler Memory Scale-Fourth Edition (WMS-IV) , select subtests Wechsler Test of Adult Reading (WTAR) Geriatric Depression Scale-Short Form (GDS-SF) Geriatric Anxiety Inventory (GAI)  Results: Test scores are relative to age, gender, and educational history as available and appropriate. Measurement properties of test scores: IQ, Index, and Standard Scores (SS): Mean = 100; Standard Deviation = 15; Scaled Scores (ss): Mean = 10; Standard Deviation = 3; Z scores (Z): Mean = 0; Standard Deviation = 1; T scores (T); Mean = 50; Standard Deviation = 10  Intellectual/Premorbid Functioning Estimate   Norm Score Percentile Range  Wechsler Test of Adult Reading  SS = 105 63 %ile Average  Premorbid cognitive abilities are estimated to be within the average range based on her performance on a word-reading/recognition task, and academic and professional history. There is some risk that this is an underestimate given the nature of her cognitive difficulties.   ATTENTION AND WORKING MEMORY   Norm Score Percentile Range  WAIS-IV          Digit Span  ss = 12 75 %ile High  Average   DSF  ss = 10 50 %ile Average   Span:    6      DSB  ss =  11 63 %ile Average   Span:    5      DSS  ss = 12 75 %ile High Average   Span:    6     Overall auditory-verbal attention performance was high average by age.  Basic span (DSF) was average and her performance ranged from average to high average on the two subtests with greater demands on working-memory/mental manipulation (DSB, DSS) (difference was not statistically significant).   PROCESSING SPEED    Norm Score Percentile Range  WAIS-IV          Coding  ss = 10 50 %ile Average  Open processing speed, as measured by rapid digit symbol translation, was scored in the average range by age.  LANGUAGE    Norm Score Percentile Range  Boston Naming Test (BNT-2)  t = 16 <0.1 %ile Exceptionally Low  COWAT          FAS  t = 30 2 %ile Below Average   Animals  t = 13 <0.1 %ile Exceptionally Low  Confrontation naming/word retrieval was scored in the exceptionally low score range by age and education.  Phonemic verbal fluency was in the below average range and semantic verbal fluency was in the exceptionally low score range,  by age and education. Semantic verbal fluency was significantly lower, statistically, then phonemic verbal fluency (>1SD).   EXECUTIVE FUNCTIONING    Norm Score Percentile Range  DKEFS - Color-Word Interference          Color Naming  ss = 5 5 %ile Below Average   Word Reading  ss = 9 37 %ile Average   Inhibition  ss = N/A - %ile Discontinued    Errors  ss = N/A - %ile N/A   Inhibition Switching       (Not administered)   Errors         Trails A  t = 37 9 %ile Low Average   Errors    0     Trails B  t = 29 2 %ile Exceptionally Low   Errors    1     The patient's rapid color naming speed was below average by age.  Rapid color-word reading was average.  She was unable to reliably complete the practice trial on the basic response inhibition trial, requiring discontinuation of the measure per standardized administration protocol.   The patient's speed on a number sequencing task  was low average by age and education.  Her speed on the number-letter sequencing trial was scored in the exceptionally low score range by age and education. She had one error on the task.   MEMORY    Norm Score Percentile Range  BVMT-R          Trial 1  t = 36 8 %ile Below Average   Trial 2  t = 30 2 %ile Below Average   Trial 3  t = 20 <0.1 %ile Exceptionally Low   Total Recall  t = 26 <0.1 %ile Exceptionally Low   Learning  t = 35 7 %ile Below Average   Delayed Recall  t = 29.0 2 %ile Exceptionally Low   % Retained    100 >16 %ile WNL   Hits     >16 %ile WNL   False Alarms     >16 %  ile WNL   Recognition Discriminability     >16 %ile WNL  HVLT          Total Recall  t = 39.0 13 %ile Low Average   Delayed Recall  t = 25 1 %ile Exceptionally Low   %Retention  t = <20 <0.1 %ile Exceptionally Low   Recognition Discriminability  t = 53.0 61 %ile Average  Wechsler Memory Scale, 4th Edition (WMS-4)         Log. Mem. Immediate Recall  ss = 6 9 %ile Low Average   Logical Memory Delayed Recall  ss = 8 25 %ile Average   Logical Recognition    51st-75th  %ile Average  The patient completed one visual memory test and to auditory-verbal memory tests.  On the visual memory test, she scored below expectations in immediate recall for each of the three learning trials/exposures and scored in the exceptionally low score range for total information encoded across those three trials. Her learning performance (gains from repetition) was scored in the below average range. Following a delay, she scored in the exceptionally low score range for free recall in terms of total information reproduced, by age. She retained all information she initially learned (% Retention = 100, >16th%ile, within normal limits). She performed within normal limits by age on all metrics of the delayed recognition task; Correct identification of 5 of 6 target figures, no false positive errors, WNL overall discriminability.   On an  auditory-verbal memory test involving a word-list, the patient scored within the low average range for total information (total number of words) learned across three learning trials. Qualitatively, she showed variable gains from repetition across the learning trials (Trial 1 = 4 words, Trial 2 = 8, Trial 3 = 6). Following a delay, she scored within the exceptionally low score range with total number of words freely-recalled and with % retention (25%). However, her performance normalized with cueing on the recognition task within which she correctly identified 11 of the 12 target words and made no false positive errors (Recognition Discriminability = 61st%ile, average range).   On an auditory-verbal memory test involving two short stories (one presented twice, one presented once), the patient's encoding/immediate recall was scored in the low average range by age. Following a delay, she scored within the average range for both free recall and on the recognition task.    VISUAL-SPATIAL    Norm Score Percentile Range  Clock       (See below)  RBANS Visuospatial Index          RBANS Figure Copy  ss = 9 37 %ile Average   RBANS Line Orientation      51st-75th   %ile Average to High Average  The patient's clock drawing showed no difficulties with visual planning/construction. Designation of time was correct for the hour hand but incorrect for the minute hand. She visual-construction performance on a figure-copy task was scored in the average range by age and she was average to high average with a basic perception task involving judgment of line orientation.   PERSONALITY AND BEHAVIORAL FUNCTIONING     Score/Interpretation  GDS-SF Raw       2  GDS-SF Severity       Minimal.  GAI Raw       1  GAI Severity       Minimal.  The patient's endorsements on self-report measures of depression and anxiety showed no clinically significant elevations within either domain.   SUMMARY /  CLINICAL IMPRESSIONS The patient  was referred for neuropsychological evaluation by her primary care physician, Dr. Alvia, due to concerns for cognitive changes and MRI brain imaging findings. The patient was seen on 05/13/23 by Dr. Alvia due to focal headaches at the recommendation of her ophthalmologist. The patient was subsequently send for MRI imaging which was completed on 05/28/23 and showed, per records, No acute intracranial abnormality, but suspicion of disproportionate volume loss in the mesial temporal lobes, left greater than right, raising the possibility of neurodegenerative disease. Mild to moderate for age cerebral white matter signal changes, nonspecific but most commonly due to small chronic small vessel disease.  The patient was subsequently scheduled with Neurology for an appointment in August and also referred for neuropsychological evaluation. Upon interview, the patient and her husband indicated a desire to undergo the evaluation to aid in identifying the likely cause of the changes and learn how best to help address those changes.   Based on clinical interview, the patient's cognitive changes are reported to primarily involve word-finding and relatively mild declines in short-term memory. Cognitive changes were reported to be gradual in onset. The patient estimated symptoms began six to 12 months ago, and her spouse reported noticing symptoms around six months ago. Course was reported to be progressive. The patient has had no significant changes in her independent functioning and continues to manage the homes finances and drives independently. The most notable change in physical health involved those involving the side of her head / headache, and slight increase in lightheadedness. There were some mild changes in mental health with emergence of seasonal mood changes. No significant psychiatric symptoms were noted at time of visit.    The patient's cognitive test profile shows indications of suspected cognitive  decline with performances falling below premorbid estimates, and estimates based on age alone, in several areas. Declines were not seen across all cognitive domains; Performances on measures of attention/concentration, processing speed, and visual-spatial functioning were in-line with expectations overall. Domains within which the patient had the greatest level of difficulty involved language and executive functioning. The patient had difficulties, albeit more moderate in severity, on memory testing. Indications of cognitive decline seen in test data, combined with details from interview regarding symptom types and course, warrant a formal cognitive diagnosis.   The patient is reported to be functioning well with respect to instrumental activities of daily living. 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   The patient's cognitive test results and overall symptom history is concerning for the possible presence of a neurodegenerative pathology. Determination of specific etiology(-ies) is limited at present. Her cognitive test profile shows a somewhat complex deficit pattern that does not reliably fit that of an individual neurodegenerative etiology. Data is somewhat concerning for an Alzheimer's disease. 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 Her language profile shows a deficit pattern consistent with AD and other signs suggestive of semantic information loss was seen elsewhere in testing (color naming speed, time designation on clock drawing). Similarly, primary subjective (and observable) cognitive difficulty involves word finding. Relative preservation of attention and processing speed would also align with early difficulties from AD.  However, the patient's performance on memory testing shows a dysexecutive memory profile (encoding and retrieval deficits [seen in improvement with cueing]) rather than the hallmark amnestic memory difficulties seen in  AD (rapid information loss over time). Dysexecutive memory deficits, and prominent deficits in executive functioning are more common in cerebrovascular disease. However, imaging findings are limited and attention, processing speed, and visual spatial abilities are better than would be expected for cerebrovascular disease. The possibility of co-occurring AD and cerebrovascular disease cannot be ruled out at this time.  Of other neurological etiologies considered. Behaviorally, the patient shows no indications of behavioral variant FTD. Cognitive test profile and symptom history does not align with LBD. Given the prominent language difficulties, primary progressive aphasia (PPA) was considered, particularly semantic PPA (a variant of FTD) and logopenic variant. Her cognitive test profile does not align with expected deficits from logopenic variant. Cognitive difficulties and behavior do not consistently align with semantic PPA, but indications of semantic information difficulties and inconsistency of memory testing for AD (AD being something which could account for these language difficulties) make me hesitant to rule this out entirely.       Diagnosis:    Recommendations:               Evalene DOROTHA Riff, PsyD             Neuropsychologist    This report was generated using voice recognition software. While this document has been carefully reviewed, transcription errors may be present. I apologize in advance for any inconvenience. Please contact me if further clarification is needed.

## 2023-09-14 ENCOUNTER — Other Ambulatory Visit: Payer: Self-pay | Admitting: Family Medicine

## 2023-09-14 NOTE — Progress Notes (Signed)
   NEUROPSYCHOLOGICAL EVALUATION [place]  Physical Medicine and Rehabilitation     Patient: Stephanie Ramsey  MRN: [id number] DOB: 09-Jul-1950   Service Provider/Clinical Neuropsychologist: Evalene DOROTHA Riff, PsyD  Date of Service: 07/28/23 Start Time: 4 PM End Time: 5 PM  Location of Service:  [facility] Physical Medicine & Rehabilitation Department [place] [address] [place] [place], [place] [address] Phone: [phone]   Billing Code/Service: [phone]    Individuals present: Patient, spouse, Provider Laurier DOROTHA Riff, PsyD)  Provider conducted the 60-minute interactive feedback appointment in-person with the patient who was accompanied by her spouse with her permission.  The provider reviewed and discussed the results of neuropsychological evaluation. Follow-up interviewing was conducted as needed to refine interpretation of findings as needed. Review of results included overall findings, diagnosis, and treatment planning/recommendations that were derived from integration of patient data, interpretation of standardized rest results and clinical data, and clinical decision making, which are documented in the patient's electronic medical record with the full report (date listed below). A copy of the full report will also be mailed to the patient.   The patient expressed understanding of the information reviewed. The patient was provided opportunity to ask questions which were then answered by the provider. The provider worked collaboratively to tailor treatment recommendations to the patient when possible. The patient was informed they could reach out to the provider should additional questions related to the evaluation arise.    The final neuropsychological evaluation report, documented in the patient's chart (DATE 07/18/23), was amended to reflect any additional information obtained during the feedback appointment including treatment  planning collaboration.    This report was generated using voice recognition software. While this document has been carefully reviewed, transcription errors may be present. I apologize in advance for any inconvenience. Please contact me if further clarification is needed.             Evalene DOROTHA Riff, PsyD             Neuropsychologist

## 2023-09-19 NOTE — Progress Notes (Unsigned)
 SABRA

## 2023-09-20 ENCOUNTER — Ambulatory Visit (INDEPENDENT_AMBULATORY_CARE_PROVIDER_SITE_OTHER): Admitting: Neurology

## 2023-09-20 ENCOUNTER — Encounter: Payer: Self-pay | Admitting: Neurology

## 2023-09-20 VITALS — BP 150/78 | HR 74 | Ht 62.0 in | Wt 141.6 lb

## 2023-09-20 DIAGNOSIS — Z87898 Personal history of other specified conditions: Secondary | ICD-10-CM

## 2023-09-20 DIAGNOSIS — R41841 Cognitive communication deficit: Secondary | ICD-10-CM

## 2023-09-20 MED ORDER — MEMANTINE HCL 5 MG PO TABS: 5.0000 mg | ORAL_TABLET | Freq: Two times a day (BID) | ORAL | 5 refills | Status: AC

## 2023-09-20 NOTE — Patient Instructions (Addendum)
 Namenda :Memantine  Tablets What is this medication? MEMANTINE  (MEM an teen) treats memory loss and confusion (dementia) in people who have Alzheimer disease. It works by improving attention, memory, and the ability to engage in daily activities. It is not a cure for dementia or Alzheimer disease. This medicine may be used for other purposes; ask your health care provider or pharmacist if you have questions. COMMON BRAND NAME(S): Namenda  What should I tell my care team before I take this medication? They need to know if you have any of these conditions: Kidney disease Liver disease Seizures Trouble passing urine An unusual or allergic reaction to memantine , other medications, foods, dyes, or preservatives Pregnant or trying to get pregnant Breast-feeding How should I use this medication? Take this medication by mouth with water. Follow the directions on the prescription label. You may take this medication with or without food. Take your doses at regular intervals. Do not take your medication more often than directed. Continue to take your medication even if you feel better. Do not stop taking except on the advice of your care team. Talk to your care team about the use of this medication in children. Special care may be needed Overdosage: If you think you have taken too much of this medicine contact a poison control center or emergency room at once. NOTE: This medicine is only for you. Do not share this medicine with others. What if I miss a dose? If you miss a dose, take it as soon as you can. If it is almost time for your next dose, take only that dose. Do not take double or extra doses. If you do not take your medication for several days, contact your care team. Your dose may need to be changed. What may interact with this medication? Acetazolamide Amantadine Cimetidine Dextromethorphan Dofetilide Hydrochlorothiazide Ketamine Metformin Methazolamide Quinidine Ranitidine Sodium  bicarbonate Triamterene This list may not describe all possible interactions. Give your health care provider a list of all the medicines, herbs, non-prescription drugs, or dietary supplements you use. Also tell them if you smoke, drink alcohol, or use illegal drugs. Some items may interact with your medicine. What should I watch for while using this medication? Visit your care team for regular checks on your progress. Check with your care team if there is no improvement in your symptoms or if they get worse. This medication may affect your coordination, reaction time, or judgment. Do not drive or operate machinery until you know how this medication affects you. Sit up or stand slowly to reduce the risk of dizzy or fainting spells. Drinking alcohol with this medication can increase the risk of these side effects. What side effects may I notice from receiving this medication? Side effects that you should report to your care team as soon as possible: Allergic reactions--skin rash, itching, hives, swelling of the face, lips, tongue, or throat Side effects that usually do not require medical attention (report to your care team if they continue or are bothersome): Confusion Constipation Diarrhea Dizziness Headache This list may not describe all possible side effects. Call your doctor for medical advice about side effects. You may report side effects to FDA at 1-800-FDA-1088. Where should I keep my medication? Keep out of the reach of children. Store at room temperature between 15 degrees and 30 degrees C (59 degrees and 86 degrees F). Throw away any unused medication after the expiration date. NOTE: This sheet is a summary. It may not cover all possible information. If you have questions about  this medicine, talk to your doctor, pharmacist, or health care provider.  2024 Elsevier/Gold Standard (2021-02-24 00:00:00)  Memory Compensation Strategies  Use WARM strategy.  W= write it down  A=  associate it  R= repeat it  M= make a mental note  2.   You can keep a Glass blower/designer.  Use a 3-ring notebook with sections for the following: calendar, important names and phone numbers,  medications, doctors' names/phone numbers, lists/reminders, and a section to journal what you did  each day.   3.    Use a calendar to write appointments down.  4.    Write yourself a schedule for the day.  This can be placed on the calendar or in a separate section of the Memory Notebook.  Keeping a  regular schedule can help memory.  5.    Use medication organizer with sections for each day or morning/evening pills.  You may need help loading it  6.    Keep a basket, or pegboard by the door.  Place items that you need to take out with you in the basket or on the pegboard.  You may also want to  include a message board for reminders.  7.    Use sticky notes.  Place sticky notes with reminders in a place where the task is performed.  For example:  turn off the  stove placed by the stove, lock the door placed on the door at eye level,  take your medications on  the bathroom mirror or by the place where you normally take your medications.  8.    Use alarms/timers.  Use while cooking to remind yourself to check on food or as a reminder to take your medicine, or as a  reminder to make a call, or as a reminder to perform another task, etc.

## 2023-09-20 NOTE — Progress Notes (Signed)
 SLEEP MEDICINE CLINIC    Provider:  Dedra Gores, MD  Primary Care Physician:  Alvia Bring, DO [address] [address] [place] [address]     Referring Provider: Alvia Bring, Do [address] [place],  [place] [address]          Chief Complaint according to patient   Patient presents with:     New Patient (Initial Visit)     Cognitive problems, has not been seen since 2023 in the sleep clinic. CSA .SABRA       HISTORY OF PRESENT ILLNESS:  Stephanie Ramsey is a 73 y.o. 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 seen upon referral on 09/20/2023  for a abnormal brain images and cognitive problems.  Chief concern according to patient :  ADL -  Housekeeping/ driving/ feeding/ bathing/ cooking/ banking/ medication intake . Patient had covid 2021, and after wards her memory became progressively impaired.  Per husband.  She was seen by optometrist and described temporal headaches only on the left temple,  no changes in vision. 73 year old female with new onset of headache for 2 months with no known injury. Cognitive changes MRI ordered.  .    Medical history: was seen here for CSA and uses CPAP. COVID. [name]  has a past medical history of Hip Arthritis and replacement in 2011 and second in 2015, revision in 2018. Breast Cancer, right breast, level in-situ- 2011 [facility]), Carpal tunnel syndrome, Depression, Hypercholesterolemia, Hypothyroidism, Osteopenia, Covid 19 twice( 02-2019/ infusion of viral medication, and again 11-2020, CSA on CPAP.Pneumonia, and Ulcerative colitis since age 46 [facility]).   Family medical history: Memory disorders:   has 4 sisters, older sister 33,  sister age  65. , sister 39 2 brothers  -one alive, one sister with memory disorder ( she is 77 years old ) and is on medications.  Parents lived to age 74 with good independent skills.   Social  history:  Patient is retired from Agricultural consultant, has a IT sales professional  and lives in a household with spouse .  Adult daughter is here with her today.  Spouse joined us  into the appointment. Tobacco use; was a smoker, quit 42 years ago-  .  ETOH use ;  wine with dinner - 1-2 glasses, 5-6 ounces  ,  Caffeine intake in form of Coffee( 1/am ) Soda( /) Tea ( /) no  energy drinks Exercise in form of walking .   Hobbies :dog     Daily routines : The patient's dinner time is between 5-7 PM. The patient goes to bed at 10 PM and continues to sleep for 7-8 hours,  Dreams are reportedly rare.  She reports now feeling refreshed or restored in AM, since using CPAP compliantly  without  symptoms such as dry mouth, morning headaches, and residual fatigue. No naps.     Reviewed MRI brain   Review of Systems: Out of a complete 14 system review, the patient complains of only the following symptoms, and all other reviewed systems are negative.:  How likely are you to doze in the following situations: 0 = not likely, 1 = slight chance, 2 = moderate chance, 3 = high chance   Sitting and Reading? Watching Television? Sitting inactive in a public place (theater or meeting)? As a passenger in a car for an hour  without a break? Lying down in the afternoon when circumstances permit? Sitting and talking to someone? Sitting quietly after lunch without alcohol? In a car, while stopped for a few minutes in traffic?   Total = 6/ 24 points    Social History   Socioeconomic History   Marital status: Married    Spouse name: [name]   Number of children: 2   Years of education: 18   Highest education level: Master's degree (e.g., MA, MS, MEng, MEd, MSW, MBA)  Occupational History    Comment: Retired  Tobacco Use   Smoking status: Former    Current packs/day: 0.00    Types: Cigarettes    Quit date: 10/12/1979    Years since quitting: 43.9   Smokeless tobacco: Never   Tobacco comments:    quit smoking  cigarettes 32 years ago  Vaping Use   Vaping status: Never Used  Substance and Sexual Activity   Alcohol use: Yes    Alcohol/week: 3.0 standard drinks of alcohol    Types: 3 Glasses of wine per week    Comment: social   Drug use: No   Sexual activity: Not on file  Other Topics Concern   Not on file  Social History Narrative   Lives with her husband. She enjoys playing with her dog and walking.   Social Drivers of Corporate investment banker Strain: Low Risk  (01/06/2023)   Overall Financial Resource Strain (CARDIA)    Difficulty of Paying Living Expenses: Not hard at all  Food Insecurity: No Food Insecurity (01/06/2023)   Hunger Vital Sign    Worried About Running Out of Food in the Last Year: Never true    Ran Out of Food in the Last Year: Never true  Transportation Needs: No Transportation Needs (01/06/2023)   PRAPARE - Administrator, Civil Service (Medical): No    Lack of Transportation (Non-Medical): No  Physical Activity: Sufficiently Active (01/06/2023)   Exercise Vital Sign    Days of Exercise per Week: 5 days    Minutes of Exercise per Session: 120 min  Stress: No Stress Concern Present (01/06/2023)   [employer] of Occupational Health - Occupational Stress Questionnaire    Feeling of Stress : Only a little  Social Connections: Socially Integrated (01/06/2023)   Social Connection and Isolation Panel    Frequency of Communication with Friends and Family: More than three times a week    Frequency of Social Gatherings with Friends and Family: Twice a week    Attends Religious Services: More than 4 times per year    Active Member of [employer] or Organizations: Yes    Attends Engineer, structural: More than 4 times per year    Marital Status: Married    Family History  Problem Relation Age of Onset   Breast cancer Mother    Kidney failure Father    Heart Problems Father    Colon cancer Sister    Lung cancer Brother    Arthritis Other      Past Medical History:  Diagnosis Date   Arthritis    Cancer (HCC)    right breast   Carpal tunnel syndrome    Chronic back pain    Complication of anesthesia    stopped breathing during procedure and dizziness after procedures   Depression    Hypercholesterolemia    Hypothyroidism    Leg cramps    Osteopenia    Pneumonia    Ulcerative colitis (HCC)  Past Surgical History:  Procedure Laterality Date   BILATERAL CARPAL TUNNEL RELEASE     BREAST BIOPSY Right 2011   In Situ DCIS   BREAST LUMPECTOMY Right 2011   in situ    BREAST SURGERY     biopsy and lumpectomy right breast   CHOLECYSTECTOMY     COLONOSCOPY W/ BIOPSIES AND POLYPECTOMY     JOINT REPLACEMENT     Total right hip   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   Procedure: LEFT TOTAL HIP ARTHROPLASTY;  Surgeon: Maude LELON Right, MD;  Location: [facility] OR;  Service: Orthopedics;  Laterality: Left;   TRIGGER FINGER RELEASE       Current Outpatient Medications on File Prior to Visit  Medication Sig Dispense Refill   atorvastatin  (LIPITOR) 20 MG tablet TAKE 1 TABLET BY MOUTH EVERY DAY 90 tablet 1   escitalopram  (LEXAPRO ) 10 MG tablet TAKE 1 AND 1/2 TABLETS DAILY BY MOUTH 135 tablet 1   gabapentin  (NEURONTIN ) 300 MG capsule TAKE 1 CAPSULE (300 MG TOTAL) BY MOUTH DAILY AS NEEDED. 90 capsule 3   levothyroxine  (SYNTHROID ) 137 MCG tablet TAKE 1 TABLET BY MOUTH EVERY DAY 90 tablet 1   Magnesium  500 MG CAPS Take by mouth.     Multiple Vitamins-Minerals (MULTIVITAMIN WITH MINERALS) tablet Take 1 tablet by mouth daily.     potassium gluconate 595 (99 K) MG TABS tablet Take 595 mg by mouth.     sulfaSALAzine  (AZULFIDINE ) 500 MG tablet Take 500 mg by mouth 3 (three) times daily.     Omega-3 Fatty Acids (FISH OIL) 1360 MG CAPS Take by mouth.     No current facility-administered medications on file prior to visit.    Allergies  Allergen Reactions   Oxycontin  [Oxycodone ] Swelling and Other (See Comments)    HYPOTENSION    Penicillins Rash and Other (See Comments)    Unknown  Unknown    Propofol       DIAGNOSTIC DATA (LABS, IMAGING, TESTING) - I reviewed patient records, labs, notes, testing and imaging myself where available.  Lab Results  Component Value Date   WBC 4.3 12/29/2022   HGB 14.0 12/29/2022   HCT 43.5 12/29/2022   MCV 103 (H) 12/29/2022   PLT 218 12/29/2022      Component Value Date/Time   NA 141 12/29/2022 1010   K 4.8 12/29/2022 1010   CL 102 12/29/2022 1010   CO2 24 12/29/2022 1010   GLUCOSE 96 12/29/2022 1010   GLUCOSE 92 06/16/2021 0000   BUN 12 12/29/2022 1010   CREATININE 0.78 12/29/2022 1010   CREATININE 0.79 06/16/2021 0000   CALCIUM  9.8 12/29/2022 1010   PROT 6.8 12/29/2022 1010   ALBUMIN 4.7 12/29/2022 1010   AST 26 12/29/2022 1010   ALT 19 12/29/2022 1010   ALKPHOS 77 12/29/2022 1010   BILITOT 0.4 12/29/2022 1010   GFRNONAA 88 07/24/2019 0802   GFRAA 102 07/24/2019 0802   Lab Results  Component Value Date   CHOL 201 (H) 10/17/2020   HDL 85 10/17/2020   LDLCALC 102 (H) 10/17/2020   TRIG 59 10/17/2020   CHOLHDL 2.4 10/17/2020   Lab Results  Component Value Date   HGBA1C 4.9 06/16/2021   Lab Results  Component Value Date   VITAMINB12 1,331 (H) 12/29/2022   Lab Results  Component Value Date   TSH 0.483 12/29/2022    PHYSICAL EXAM:  There were no vitals filed for this visit. There is no height or weight on file to calculate  BMI.   Wt Readings from Last 3 Encounters:  08/23/23 142 lb (64.4 kg)  05/13/23 148 lb (67.1 kg)  02/24/23 145 lb 8 oz (66 kg)     Ht Readings from Last 3 Encounters:  08/23/23 5' 2 (1.575 m)  05/13/23 5' 2 (1.575 m)  02/24/23 5' 2 (1.575 m)      General: The patient is awake, alert and appears not in acute distress, but she is giddy and nervous. . The patient is well groomed. Head: Normocephalic, atraumatic. Neck is supple. Cardiovascular:  Regular rate and cardiac rhythm by pulse,  without distended neck  veins. Respiratory: Lungs are clear to auscultation.  Skin:  Without evidence of ankle edema, or rash. Trunk: The patient's posture is erect.  Mallampati 1- but short and steep-,  neck circumference:14 inches .  Nasal airflow is patent.  Retrognathia is not seen.  Dental status: biological , veneer    NEUROLOGIC EXAM: The patient is awake and alert, oriented to place and time.   Memory subjective described as impaired   >MMSE    09/20/2023    1:40 PM  MMSE - Mini Mental State Exam  Orientation to time 4  Orientation to Place 3  Registration 3  Attention/ Calculation 4  Recall 0  Language- name 2 objects 2  Language- repeat 1  Language- follow 3 step command 2  Language- read & follow direction 1  Write a sentence 1  Copy design 1  Total score 22         No data to display          Attention span & concentration ability appears very limited. Easily distracted.  Speech is fluent,  without  dysarthria, dysphonia or aphasia.  Mood and affect are giddy, evasive.   Cranial nerves: no loss of smell or taste reported  Pupils are equal and briskly reactive to light. Left pupil slightly disrounded, both enlarged.  Funduscopic exam def.  Extraocular movements in vertical and horizontal planes were intact .  Hearing was intact to soft voice .    Facial sensation intact to fine touch.  Facial motor strength is symmetric and tongue and uvula move midline.  Neck ROM : rotation, tilt and flexion extension were normal for age and shoulder shrug was symmetrical.    Motor exam:  Symmetric bulk, tone and ROM.  Historyof  right rotator cuff injury without restriction in ROM.  Normal tone without cog wheeling, symmetric grip strength .   Sensory:  Fine touch, pinprick and vibration were tested  and  normal.  Proprioception tested in the upper extremities was normal.   Coordination: Rapid alternating movements in the fingers/hands were of normal speed.  The Finger-to-nose  maneuver was intact without evidence of ataxia, dysmetria or tremor.   Gait and station: Patient could rise unassisted from a seated position, walked without assistive device.  Stance is of normal width/ base and the patient turned with 3.5 steps.  Toe and heel walk were deferred.  Deep tendon reflexes: in the  upper and lower extremities are symmetric and intact.      ASSESSMENT AND PLAN 73 y.o. year old female  here with:   Decline in cognitive abilities which husband and close family friend have noted over the last 2 maybe 3 years.  The patient herself would put the time frame at 1 year.  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  Remarkable was her ability to repeat in great detail she had no visual-spatial obvious problems her Trail Making Test was excellent but yet she could not name 3 very common animals.  Clock numbers clock face were intact but the hands of the clock were reversed instead of 11:10 she puts the hands at 5-11.  Serial 7 was 4 points !   The recall of words was impaired.   The orientation to month year day and date was intact.    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     So this is an unusual distribution of a cognitive disorder it does not selectively only affect her short-term memory but mainly naming.  With the intact visual-spatial abilities and the intact outcome of the Trail Making Test there is usually no driving restriction.  She doesn't have delayed responses     1)  ATN, genetic testing.  DEMENTIA panel. Reviewed the MRI -   2) next step is a  repeat MMSE and PET scan of amyloid or metabolic .   History of brady cardia.  Will use namenda  only, not Aricept.   I plan to follow up either personally or through our NP within 5-6 months.    I would like to thank Alvia Bring, DO and Alvia Bring, Do [address] [place],  [place] [address] for allowing me to meet with and to take care of this pleasant patient.   Discussion of sleep hygiene setting bedtime and rise time,  hot shower  before bed time, no screen light in the bedroom, the bedroom should be cool, quiet and dark. Night lights should illuminate the floor not shine into your eyes. Golden glow  light is less intrusive than blue or cold light.  Read in a book with pages, not on a device. Consider audio books and soothing  sound -scapes.    After spending a total time of  55  minutes face to face and additional time for physical and neurologic examination, review of laboratory studies,  personal review of imaging studies, reports and results of other testing and review of referral information / records as far as provided in visit,   Electronically signed by: Dedra Gores, MD 09/20/2023 1:21 PM  [facility] Board certified by The [employer] of Sleep Medicine and Diplomate of the [employer] of Sleep Medicine. Board certified In Neurology through the ABPN, Fellow of the [employer] of Neurology.

## 2023-09-21 ENCOUNTER — Encounter: Payer: Self-pay | Admitting: Family Medicine

## 2023-09-21 ENCOUNTER — Ambulatory Visit: Admitting: Gastroenterology

## 2023-09-21 DIAGNOSIS — M62838 Other muscle spasm: Secondary | ICD-10-CM

## 2023-09-21 DIAGNOSIS — M2550 Pain in unspecified joint: Secondary | ICD-10-CM

## 2023-09-21 MED ORDER — GABAPENTIN 300 MG PO CAPS: 300.0000 mg | ORAL_CAPSULE | Freq: Every day | ORAL | 3 refills | Status: AC | PRN

## 2023-09-22 ENCOUNTER — Ambulatory Visit: Payer: Self-pay | Admitting: Neurology

## 2023-09-25 ENCOUNTER — Other Ambulatory Visit: Payer: Self-pay | Admitting: Family Medicine

## 2023-09-28 ENCOUNTER — Ambulatory Visit: Admitting: Neurology

## 2023-10-05 ENCOUNTER — Ambulatory Visit: Attending: Neurology | Admitting: Speech Pathology

## 2023-10-05 ENCOUNTER — Encounter: Payer: Self-pay | Admitting: Speech Pathology

## 2023-10-05 DIAGNOSIS — Z87898 Personal history of other specified conditions: Secondary | ICD-10-CM | POA: Diagnosis not present

## 2023-10-05 DIAGNOSIS — R41841 Cognitive communication deficit: Secondary | ICD-10-CM | POA: Insufficient documentation

## 2023-10-05 DIAGNOSIS — R4701 Aphasia: Secondary | ICD-10-CM | POA: Insufficient documentation

## 2023-10-05 LAB — HEMOGLOBIN A1C
Est. average glucose Bld gHb Est-mCnc: 94 mg/dL
Hgb A1c MFr Bld: 4.9 % (ref 4.8–5.6)

## 2023-10-05 LAB — PROTEIN ELECTROPHORESIS, SERUM
A/G Ratio: 1.4 (ref 0.7–1.7)
Albumin ELP: 3.9 g/dL (ref 2.9–4.4)
Alpha 1: 0.3 g/dL (ref 0.0–0.4)
Alpha 2: 0.6 g/dL (ref 0.4–1.0)
Beta: 1 g/dL (ref 0.7–1.3)
Gamma Globulin: 0.9 g/dL (ref 0.4–1.8)
Globulin, Total: 2.7 g/dL (ref 2.2–3.9)

## 2023-10-05 LAB — RPR: RPR Ser Ql: NONREACTIVE

## 2023-10-05 LAB — COMPREHENSIVE METABOLIC PANEL WITH GFR
ALT: 14 IU/L (ref 0–32)
AST: 21 IU/L (ref 0–40)
Albumin: 4.5 g/dL (ref 3.8–4.8)
Alkaline Phosphatase: 82 IU/L (ref 44–121)
BUN/Creatinine Ratio: 22 (ref 12–28)
BUN: 15 mg/dL (ref 8–27)
Bilirubin Total: 0.3 mg/dL (ref 0.0–1.2)
CO2: 25 mmol/L (ref 20–29)
Calcium: 9.8 mg/dL (ref 8.7–10.3)
Chloride: 101 mmol/L (ref 96–106)
Creatinine, Ser: 0.67 mg/dL (ref 0.57–1.00)
Globulin, Total: 2.1 g/dL (ref 1.5–4.5)
Glucose: 83 mg/dL (ref 70–99)
Potassium: 3.9 mmol/L (ref 3.5–5.2)
Sodium: 140 mmol/L (ref 134–144)
Total Protein: 6.6 g/dL (ref 6.0–8.5)
eGFR: 92 mL/min/1.73 (ref >59)

## 2023-10-05 LAB — CBC WITH DIFFERENTIAL/PLATELET
Basophils Absolute: 0.1 x10E3/uL (ref 0.0–0.2)
Basos: 1 %
EOS (ABSOLUTE): 0 x10E3/uL (ref 0.0–0.4)
Eos: 1 %
Hematocrit: 41.3 % (ref 34.0–46.6)
Hemoglobin: 13 g/dL (ref 11.1–15.9)
Immature Grans (Abs): 0 x10E3/uL (ref 0.0–0.1)
Immature Granulocytes: 0 %
Lymphocytes Absolute: 1.6 x10E3/uL (ref 0.7–3.1)
Lymphs: 29 %
MCH: 32.7 pg (ref 26.6–33.0)
MCHC: 31.5 g/dL (ref 31.5–35.7)
MCV: 104 fL — ABNORMAL HIGH (ref 79–97)
Monocytes Absolute: 0.3 x10E3/uL (ref 0.1–0.9)
Monocytes: 6 %
Neutrophils Absolute: 3.4 x10E3/uL (ref 1.4–7.0)
Neutrophils: 63 %
Platelets: 242 x10E3/uL (ref 150–450)
RBC: 3.98 x10E6/uL (ref 3.77–5.28)
RDW: 12 % (ref 11.7–15.4)
WBC: 5.4 x10E3/uL (ref 3.4–10.8)

## 2023-10-05 LAB — TSH+FREE T4
Free T4: 1.31 ng/dL (ref 0.82–1.77)
TSH: 1.83 u[IU]/mL (ref 0.450–4.500)

## 2023-10-05 LAB — APOE ALZHEIMER'S RISK

## 2023-10-05 LAB — ANA W/REFLEX: Anti Nuclear Antibody (ANA): NEGATIVE

## 2023-10-05 LAB — SEDIMENTATION RATE: Sed Rate: 3 mm/h (ref 0–40)

## 2023-10-05 LAB — METHYLMALONIC ACID, SERUM: Methylmalonic Acid: 159 nmol/L (ref 0–378)

## 2023-10-05 LAB — VITAMIN B12: Vitamin B-12: 933 pg/mL (ref 232–1245)

## 2023-10-05 LAB — HIV ANTIBODY (ROUTINE TESTING W REFLEX): HIV Screen 4th Generation wRfx: NONREACTIVE

## 2023-10-05 LAB — HOMOCYSTEINE: Homocysteine: 9.8 umol/L (ref 0.0–19.2)

## 2023-10-05 NOTE — Therapy (Unsigned)
 OUTPATIENT SPEECH LANGUAGE PATHOLOGY EVALUATION   Patient Name: Stephanie Ramsey MRN: [id number] DOB:1950/11/14, 73 y.o., female Today's Date: 10/05/2023  PCP: Alvia Bring, DO REFERRING PROVIDER: Dohmeier, Dedra, MD  END OF SESSION:  End of Session - 10/05/23 0853     Visit Number 1    SLP Start Time 0845    SLP Stop Time  0930    SLP Time Calculation (min) 45 min    Activity Tolerance Patient tolerated treatment well          Past Medical History:  Diagnosis Date   Arthritis    Cancer (HCC)    right breast   Carpal tunnel syndrome    Chronic back pain    Complication of anesthesia    stopped breathing during procedure and dizziness after procedures   Depression    Hypercholesterolemia    Hypothyroidism    Leg cramps    Osteopenia    Pneumonia    Ulcerative colitis (HCC)    Past Surgical History:  Procedure Laterality Date   BILATERAL CARPAL TUNNEL RELEASE     BREAST BIOPSY Right 2011   In Situ DCIS   BREAST LUMPECTOMY Right 2011   in situ    BREAST SURGERY     biopsy and lumpectomy right breast   CHOLECYSTECTOMY     COLONOSCOPY W/ BIOPSIES AND POLYPECTOMY     JOINT REPLACEMENT     Total right hip   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   Procedure: LEFT TOTAL HIP ARTHROPLASTY;  Surgeon: Maude LELON Right, MD;  Location: [facility] OR;  Service: Orthopedics;  Laterality: Left;   TRIGGER FINGER RELEASE     Patient Active Problem List   Diagnosis Date Noted   Tick bite 08/23/2023   Left temporal headache 05/13/2023   DDD (degenerative disc disease), cervical 02/27/2023   Need for hepatitis C screening test 01/06/2023   Raynaud's disease without gangrene 09/28/2022   Pain of left heel 09/28/2022   Fall 05/04/2022   Neck pain 05/04/2022   Rib pain on right side 12/24/2021   Chronic intermittent hypoxia with obstructive sleep apnea 08/24/2021   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) 08/05/2021   Non-restorative sleep 06/29/2021   Snoring 06/29/2021   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   Morning headache 06/16/2021   Essential tremor 06/16/2021   Rib pain on left side 01/12/2021   Left wrist pain 01/12/2021   Pleuritic chest pain 12/16/2020   Tenosynovitis of right wrist 12/03/2020   Abnormal prominence of clavicle 10/20/2020   Lumbar degenerative disc disease 12/10/2019   Benign paroxysmal positional vertigo of right ear 08/16/2019   History of adenomatous polyp of colon 01/26/2018   Trigger ring finger of right hand 11/16/2017   Leg cramps 06/02/2016   Encounter for Medicare annual wellness exam 08/11/2015   Hyperlipidemia 07/26/2015   Hypothyroidism 07/26/2015   Ulcerative colitis (HCC) 07/26/2015   Subcutaneous nodules 07/26/2015   Pain in joints 07/26/2015   Osteopenia 07/26/2015   Neutropenia (HCC) 07/26/2015   Neck muscle spasm 07/26/2015   Malignant neoplasm of breast (HCC) 07/26/2015   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   Pt accompanied by: self  PERTINENT HISTORY: Per EMR: Hip Arthritis and replacement in 2011 and second in 2015, revision in 2018. Breast Cancer, right breast, level in-situ- 2011 [facility]), Carpal tunnel syndrome, Depression, Hypercholesterolemia, Hypothyroidism, Osteopenia, Covid 19 twice( 02-2019/ infusion of viral medication, and again 11-2020, CSA on  CPAP.Pneumonia, and Ulcerative colitis since age 89 [facility]).   PAIN:  Are you having pain? No  FALLS: Has patient fallen in last 6 months?  No  LIVING ENVIRONMENT: Lives with: lives with their spouse; [place] Lives in: House/apartment  PLOF:  Level of assistance: Independent with ADLs, Independent with IADLs Employment: Retired; Runner, broadcasting/film/video   PATIENT GOALS: word finding   OBJECTIVE:  Note: Objective measures were completed at Evaluation unless otherwise noted.  DIAGNOSTIC FINDINGS: Per EMR:  IMPRESSION: 1. No acute intracranial abnormality. 2. But suspicion of disproportionate volume loss in the mesial temporal lobes, left greater than right, raising the possibility of Neurodegenerative disease. 3. Mild to moderate for age cerebral white matter signal changes, nonspecific but most commonly due to small chronic small vessel disease.     Electronically Signed   By: VEAR Hurst M.D.   On: 06/10/2023 11:08  COGNITION: Overall cognitive status: Impaired Areas of impairment:  Attention: Impaired: Selective, Alternating, Divided Memory: Impaired: Working Teacher, music term Archivist function: Impaired: Organization and Planning Functional deficits: Recall of basic information; husband was not here to provide additional insight.  COGNITIVE COMMUNICATION: Following directions: Follows multi-step commands inconsistently and with increased time Auditory comprehension: Impaired: Requires repetition; to be further evaluated  Verbal expression: Impaired: Word finding/word retrieval Functional communication: Impaired: Impacting conversations with family  ORAL MOTOR EXAMINATION: Overall status: Did not assess Comments: NA  STANDARDIZED ASSESSMENTS:   Cognitive Linguistic Quick Test: AGE - 18 - 69   The Cognitive Linguistic Quick Test (CLQT) was administered to assess the relative status of five cognitive domains: attention, memory, language, executive functioning, and  visuospatial skills. Scores from 10 tasks were used to estimate severity ratings (standardized for age groups 18-69 years and 70-89 years) for each domain, a clock drawing task, as well as an overall composite severity rating of cognition.       Task Score Criterion Cut Scores  Personal Facts 8/8 8  Symbol Cancellation 6/12 11  Confrontation Naming 7/10 10  Clock Drawing  9/13 12  Story Retelling 7/10 6  Symbol Trails 8/10 9  Generative Naming 4/9 5  Design Memory -/6 5  Mazes  8/8 7  Design Generation 7/13 6      PATIENT REPORTED OUTCOME MEASURES (PROM): To complete in subsequent sessions  TREATMENT DATE:     PATIENT EDUCATION: Education details: SLP role in Cognitive-Communication Person educated: Patient Education method: Explanation Education comprehension: verbalized understanding and needs further education   GOALS: Goals reviewed with patient? No  SHORT TERM GOALS: Target date: 10/17/23  Complete CLQT/language measures, PROM, and update goals Baseline: Goal status: INITIAL   LONG TERM GOALS: Target date: 11/18/23  Improve score on PROM Baseline:  Goal status: INITIAL  2.   Baseline:  Goal status: INITIAL  3.   Baseline:  Goal status: INITIAL  4.   Baseline:  Goal status: INITIAL  5.   Baseline:  Goal status: INITIAL  6.   Baseline:  Goal status: INITIAL  ASSESSMENT:  CLINICAL IMPRESSION: Pt is a 73 yo female who presents to ST OP for evaluation with complaints of cognitive changes. Pt endorses difficulty retrieving words - 2-5 minutes later the word comes to her. Pt reports the more complicated the information is, the more difficulty she has recalling information. No anxiety/depression. Pt reports she has gotten used to the word finding deficits, and tries to not to take it too seriously. Pt was assessed using CLQT- see  above for details. To complete next session. SLP observed pt requiring repetition of basic instructions. Pt was observed to have most difficulty with generating items in categories - SLP to assess auditory comprehension and expression further. SLP rec skilled ST services to address cognitive-linguistic impairment to maximize functional communication.    OBJECTIVE IMPAIRMENTS: include attention, memory, expressive language, and receptive language. These impairments are limiting patient from managing medications, managing appointments, managing finances, household responsibilities, ADLs/IADLs, and effectively communicating at home and in community. Factors affecting potential to achieve goals and functional outcome are NA.SABRA Patient will benefit from skilled SLP services to address above impairments and improve overall function.  REHAB POTENTIAL: Good  PLAN:  SLP FREQUENCY: 1x/week  SLP DURATION: other: 5 weeks; pt will be gone all of October. To recert at the end of September if needed.   PLANNED INTERVENTIONS: Language facilitation, Environmental controls, Cueing hierachy, Cognitive reorganization, Internal/external aids, Functional tasks, Multimodal communication approach, SLP instruction and feedback, Compensatory strategies, Patient/family education, and [id number] Treatment of speech (30 or 45 min)     [employer], CCC-SLP 10/05/2023, 8:54 AM

## 2023-10-06 ENCOUNTER — Ambulatory Visit

## 2023-10-06 ENCOUNTER — Other Ambulatory Visit: Payer: Self-pay

## 2023-10-06 ENCOUNTER — Ambulatory Visit (INDEPENDENT_AMBULATORY_CARE_PROVIDER_SITE_OTHER): Admitting: Sports Medicine

## 2023-10-06 VITALS — Ht 62.0 in | Wt 141.0 lb

## 2023-10-06 DIAGNOSIS — M2550 Pain in unspecified joint: Secondary | ICD-10-CM | POA: Diagnosis not present

## 2023-10-06 DIAGNOSIS — M79671 Pain in right foot: Secondary | ICD-10-CM | POA: Diagnosis not present

## 2023-10-06 DIAGNOSIS — M810 Age-related osteoporosis without current pathological fracture: Secondary | ICD-10-CM | POA: Diagnosis not present

## 2023-10-06 MED ORDER — DICLOFENAC SODIUM 1 % EX GEL: 4.0000 g | Freq: Four times a day (QID) | CUTANEOUS | 11 refills | Status: AC

## 2023-10-06 NOTE — Telephone Encounter (Signed)
 Patient would like the following medication sent to  [id number] - [place], [place] - [address]    Medications:  []   sulfaSALAzine  (AZULFIDINE ) 500 MG tablet

## 2023-10-06 NOTE — Progress Notes (Signed)
    Procedures performed today:    None.  Independent interpretation of notes and tests performed by another provider:   None.  Brief History, Exam, Impression, and Recommendations:    Right foot pain first MTP Pleasant 73 year old female, she has had a few weeks of increasing pain right foot at the MTP, she tells me did present with relatively rapid swelling overnight, some redness, pain. Pain was not severe. She did have some more red meat and alcohol a few days prior to the episode. It then started to resolve on its own. She does have a history of a hip arthroplasty on the left with a resultant leg length discrepancy left longer than right and has a lift in her sandals for the right side. I did measure her today, ASIS to medial malleolus and she does have a discrepancy, see overview. Considering her history, there is suspicion for a crystalline type flare, osteoarthritis is also in the differential. Adding x-rays, referral for custom molded inserts potentially with a first metatarsal Drozdowski post and a full-length lift on the right side. Topical Voltaren  gel. Uric acid levels. Return to see me in 6 weeks.    ____________________________________________ Debby PARAS. Curtis, M.D., ABFM., CAQSM., AME. Primary Care and Sports Medicine [facility]  Adjunct Professor of [facility] Medicine  [employer]  Restaurant manager, fast food

## 2023-10-06 NOTE — Telephone Encounter (Signed)
 Patient requesting historical medication refill.

## 2023-10-06 NOTE — Assessment & Plan Note (Signed)
 Pleasant 73 year old female, she has had a few weeks of increasing pain right foot at the MTP, she tells me did present with relatively rapid swelling overnight, some redness, pain. Pain was not severe. She did have some more red meat and alcohol a few days prior to the episode. It then started to resolve on its own. She does have a history of a hip arthroplasty on the left with a resultant leg length discrepancy left longer than right and has a lift in her sandals for the right side. I did measure her today, ASIS to medial malleolus and she does have a discrepancy, see overview. Considering her history, there is suspicion for a crystalline type flare, osteoarthritis is also in the differential. Adding x-rays, referral for custom molded inserts potentially with a first metatarsal Ekstrom post and a full-length lift on the right side. Topical Voltaren  gel. Uric acid levels. Return to see me in 6 weeks.

## 2023-10-07 ENCOUNTER — Ambulatory Visit: Payer: Self-pay | Admitting: Sports Medicine

## 2023-10-07 LAB — COMPREHENSIVE METABOLIC PANEL WITH GFR
ALT: 16 IU/L (ref 0–32)
AST: 24 IU/L (ref 0–40)
Albumin: 4.6 g/dL (ref 3.8–4.8)
Alkaline Phosphatase: 80 IU/L (ref 44–121)
BUN/Creatinine Ratio: 21 (ref 12–28)
BUN: 16 mg/dL (ref 8–27)
Bilirubin Total: 0.4 mg/dL (ref 0.0–1.2)
CO2: 22 mmol/L (ref 20–29)
Calcium: 10 mg/dL (ref 8.7–10.3)
Chloride: 102 mmol/L (ref 96–106)
Creatinine, Ser: 0.77 mg/dL (ref 0.57–1.00)
Globulin, Total: 2.1 g/dL (ref 1.5–4.5)
Glucose: 68 mg/dL — ABNORMAL LOW (ref 70–99)
Potassium: 4.1 mmol/L (ref 3.5–5.2)
Sodium: 140 mmol/L (ref 134–144)
Total Protein: 6.7 g/dL (ref 6.0–8.5)
eGFR: 81 mL/min/1.73 (ref >59)

## 2023-10-07 LAB — URIC ACID: Uric Acid: 4.8 mg/dL (ref 3.1–7.9)

## 2023-10-07 MED ORDER — SULFASALAZINE 500 MG PO TABS: 500.0000 mg | ORAL_TABLET | Freq: Three times a day (TID) | ORAL | 3 refills | Status: DC

## 2023-10-10 ENCOUNTER — Encounter: Admitting: Gastroenterology

## 2023-10-10 ENCOUNTER — Other Ambulatory Visit (INDEPENDENT_AMBULATORY_CARE_PROVIDER_SITE_OTHER): Payer: Self-pay

## 2023-10-10 DIAGNOSIS — Z0289 Encounter for other administrative examinations: Secondary | ICD-10-CM

## 2023-10-10 DIAGNOSIS — Z87898 Personal history of other specified conditions: Secondary | ICD-10-CM | POA: Diagnosis not present

## 2023-10-10 DIAGNOSIS — R41841 Cognitive communication deficit: Secondary | ICD-10-CM | POA: Diagnosis not present

## 2023-10-11 DIAGNOSIS — K5190 Ulcerative colitis, unspecified, without complications: Secondary | ICD-10-CM | POA: Diagnosis not present

## 2023-10-11 DIAGNOSIS — K635 Polyp of colon: Secondary | ICD-10-CM | POA: Diagnosis not present

## 2023-10-11 DIAGNOSIS — D899 Disorder involving the immune mechanism, unspecified: Secondary | ICD-10-CM | POA: Diagnosis not present

## 2023-10-13 ENCOUNTER — Ambulatory Visit (INDEPENDENT_AMBULATORY_CARE_PROVIDER_SITE_OTHER)

## 2023-10-13 VITALS — BP 134/82 | Ht 62.0 in | Wt 142.0 lb

## 2023-10-13 DIAGNOSIS — M2170 Unequal limb length (acquired), unspecified site: Secondary | ICD-10-CM | POA: Diagnosis not present

## 2023-10-13 DIAGNOSIS — M79671 Pain in right foot: Secondary | ICD-10-CM

## 2023-10-13 DIAGNOSIS — M216X1 Other acquired deformities of right foot: Secondary | ICD-10-CM

## 2023-10-13 LAB — ATN PROFILE
A -- Beta-amyloid 42/40 Ratio: 0.108 (ref >0.102)
Beta-amyloid 40: 159.34 pg/mL
Beta-amyloid 42: 17.25 pg/mL
N -- NfL, Plasma: 3.33 pg/mL (ref 0.00–6.04)
T -- p-tau181: 1.7 pg/mL — AB (ref 0.00–0.97)

## 2023-10-14 NOTE — Progress Notes (Addendum)
 PCP: Alvia Bring, DO  Subjective:   HPI: Patient is a 73 y.o. female here for fitting of custom orthotics.  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  However, these hip replacement caused a leg length discrepancy where her right leg is now 3/4 inch shorter than her left leg.  She has been managing this leg length discrepancy with heel wedges and OTC orthotics but they have become too bulky in her shoes recently.  She also notices that poorly fitting heel lift causes unnatural plantarflexion when walking which is recently caused her to develop pain at her first MTP joint of her right foot.  Here today on the recommendation/referral of Dr. Curtis.   Past Medical History:  Diagnosis Date   Arthritis    [facility])    right breast   Carpal tunnel syndrome    Chronic back pain    Complication of anesthesia    stopped breathing during procedure and dizziness after procedures   Depression    Hypercholesterolemia    Hypothyroidism    Leg cramps    Osteopenia    Pneumonia    Ulcerative colitis (HCC)     Current Outpatient Medications on File Prior to Visit  Medication Sig Dispense Refill   atorvastatin  (LIPITOR) 20 MG tablet TAKE 1 TABLET BY MOUTH EVERY DAY 90 tablet 1   diclofenac  Sodium (VOLTAREN ) 1 % GEL Apply 4 g topically 4 (four) times daily. To affected joint. 100 g 11   escitalopram  (LEXAPRO ) 10 MG tablet TAKE 1 AND 1/2 TABLETS DAILY BY MOUTH 135 tablet 1   gabapentin  (NEURONTIN ) 300 MG capsule Take 1 capsule (300 mg total) by mouth daily as needed. 90 capsule 3   levothyroxine  (SYNTHROID ) 137 MCG tablet TAKE 1 TABLET BY MOUTH EVERY DAY 90 tablet 1   Magnesium  500 MG CAPS Take by mouth.     memantine  (NAMENDA ) 5 MG tablet Take 1 tablet (5 mg total) by mouth 2 (two) times daily. 60 tablet 5   Multiple Vitamins-Minerals (MULTIVITAMIN WITH MINERALS) tablet Take 1 tablet by mouth daily.     Omega-3 Fatty Acids (FISH OIL) 1360 MG CAPS Take  by mouth.     potassium gluconate 595 (99 K) MG TABS tablet Take 595 mg by mouth.     sulfaSALAzine  (AZULFIDINE ) 500 MG tablet Take 1 tablet (500 mg total) by mouth 3 (three) times daily. 90 tablet 3   No current facility-administered medications on file prior to visit.    Past Surgical History:  Procedure Laterality Date   BILATERAL CARPAL TUNNEL RELEASE     BREAST BIOPSY Right 2011   In Situ DCIS   BREAST LUMPECTOMY Right 2011   in situ    BREAST SURGERY     biopsy and lumpectomy right breast   CHOLECYSTECTOMY     COLONOSCOPY W/ BIOPSIES AND POLYPECTOMY     JOINT REPLACEMENT     Total right hip   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   Procedure: LEFT TOTAL HIP ARTHROPLASTY;  Surgeon: Maude LELON Right, MD;  Location: [facility] OR;  Service: Orthopedics;  Laterality: Left;   TRIGGER FINGER RELEASE      Allergies  Allergen Reactions   Oxycontin  [Oxycodone ] Swelling and Other (See Comments)    HYPOTENSION   Penicillins Rash and Other (See Comments)    Unknown  Unknown    Propofol      BP 134/82   Ht 5' 2 (1.575 m)   Wt 142 lb (64.4  kg)   BMI 25.97 kg/m       No data to display              No data to display              Objective:  Physical Exam:  Gen: NAD, comfortable in exam room  Leg length measurement: Right leg 3/4 inch shorter than left leg measuring from ASIS to medial malleolus  Ankle/Foot, Right: Minimal TTP noted at the 1st MTP head. No visible erythema, swelling, ecchymosis, or bony deformity. No notable pes planus/cavus deformity. Mild transverse arch collapse on bilateral feet; Normal eversion/inversion stress testing. Range of motion is full in all directions. Strength is 5/5 in all directions.  Gait assessment reveals right foot excessive pronation with external rotation and too many toes sign.  Neurovascular intact.    Assessment & Plan:  #Acquired leg length discrepancy, right shorter than left by 3/4 inch #Pain at right foot first MTP  head #Loss of transverse arch bilaterally Patient referred to us  for  fitting of custom orthotics Patient was fitted for a : Fit 'n Run semi-rigid orthotic  The orthotic was heated, placed on the orthotic stand. The patient was positioned in subtalar neutral position and 10 degrees of ankle dorsiflexion in a weight bearing stance on the heated orthotic blank After completion of molding Blank: Fit 'n Run - size 9 Posting:  none Base: Added a 7/16th heel lift to base of custom fit and run orthotic.  This addresses roughly half of the leg length discrepancy while preventing overcorrection.  If patient feels like this is not sufficient he will lift she is to return in 4 to 6 weeks for additional heel lift insertion. Patient pleased with her custom orthotics and endorsed increased comfort.  Understands treatment plan with no further questions or concerns at this time.

## 2023-10-18 ENCOUNTER — Encounter: Payer: Self-pay | Admitting: Sports Medicine

## 2023-10-19 ENCOUNTER — Encounter: Payer: Self-pay | Admitting: Speech Pathology

## 2023-10-19 ENCOUNTER — Ambulatory Visit: Attending: Neurology | Admitting: Speech Pathology

## 2023-10-19 DIAGNOSIS — R41841 Cognitive communication deficit: Secondary | ICD-10-CM | POA: Insufficient documentation

## 2023-10-19 DIAGNOSIS — R4701 Aphasia: Secondary | ICD-10-CM | POA: Diagnosis not present

## 2023-10-19 NOTE — Therapy (Unsigned)
 OUTPATIENT SPEECH LANGUAGE PATHOLOGY TREATMENT   Patient Name: Stephanie Ramsey MRN: [id number] DOB:March 27, 1950, 73 y.o., female Today's Date: 10/19/2023  PCP: Alvia Bring, DO REFERRING PROVIDER: Dohmeier, Dedra, MD  END OF SESSION:  End of Session - 10/19/23 0859     Visit Number 2    Number of Visits 9    Date for SLP Re-Evaluation 12/06/23    SLP Start Time 0855    SLP Stop Time  0925    SLP Time Calculation (min) 30 min    Activity Tolerance Patient tolerated treatment well          Past Medical History:  Diagnosis Date   Arthritis    Cancer (HCC)    right breast   Carpal tunnel syndrome    Chronic back pain    Complication of anesthesia    stopped breathing during procedure and dizziness after procedures   Depression    Hypercholesterolemia    Hypothyroidism    Leg cramps    Osteopenia    Pneumonia    Ulcerative colitis (HCC)    Past Surgical History:  Procedure Laterality Date   BILATERAL CARPAL TUNNEL RELEASE     BREAST BIOPSY Right 2011   In Situ DCIS   BREAST LUMPECTOMY Right 2011   in situ    BREAST SURGERY     biopsy and lumpectomy right breast   CHOLECYSTECTOMY     COLONOSCOPY W/ BIOPSIES AND POLYPECTOMY     JOINT REPLACEMENT     Total right hip   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   Procedure: LEFT TOTAL HIP ARTHROPLASTY;  Surgeon: Maude LELON Right, MD;  Location: [facility] OR;  Service: Orthopedics;  Laterality: Left;   TRIGGER FINGER RELEASE     Patient Active Problem List   Diagnosis Date Noted   Right foot pain first MTP 10/06/2023   Tick bite 08/23/2023   Left temporal headache 05/13/2023   DDD (degenerative disc disease), cervical 02/27/2023   Need for hepatitis C screening test 01/06/2023   Raynaud's disease without gangrene 09/28/2022   Pain of left heel 09/28/2022   Fall 05/04/2022   Neck pain 05/04/2022   Rib pain on right side 12/24/2021   Chronic intermittent hypoxia with obstructive sleep apnea 08/24/2021   Complex sleep  apnea syndrome 08/24/2021   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   Non-restorative sleep 06/29/2021   Snoring 06/29/2021   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   Morning headache 06/16/2021   Essential tremor 06/16/2021   Rib pain on left side 01/12/2021   Left wrist pain 01/12/2021   Pleuritic chest pain 12/16/2020   Tenosynovitis of right wrist 12/03/2020   Abnormal prominence of clavicle 10/20/2020   Lumbar degenerative disc disease 12/10/2019   Benign paroxysmal positional vertigo of right ear 08/16/2019   History of adenomatous polyp of colon 01/26/2018   Trigger ring finger of right hand 11/16/2017   Leg cramps 06/02/2016   Encounter for Medicare annual wellness exam 08/11/2015   Hyperlipidemia 07/26/2015   Hypothyroidism 07/26/2015   Ulcerative colitis (HCC) 07/26/2015   Subcutaneous nodules 07/26/2015   Pain in joints 07/26/2015   Osteopenia 07/26/2015   Neutropenia (HCC) 07/26/2015   Neck muscle spasm 07/26/2015   Malignant neoplasm of breast (HCC) 07/26/2015   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   IFG (impaired fasting glucose) 07/26/2015   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   Primary osteoarthritis of left hip 01/03/2014   S/P revision of  total hip 01/01/2014    ONSET DATE: Referred on 09/20/23   REFERRING DIAG:  R41.841 (ICD-10-CM) - Cognitive communication disorder  Z87.898 (ICD-10-CM) - History of short term memory loss    THERAPY DIAG:  Cognitive communication deficit  Aphasia  Rationale for Evaluation and Treatment: Rehabilitation  SUBJECTIVE:   SUBJECTIVE STATEMENT: Pt was pleasant and cooperative throughout assessment.   Pt accompanied by: self  PERTINENT HISTORY: Per EMR: Hip Arthritis and replacement in 2011 and second in 2015, revision in 2018. Breast Cancer, right breast, level in-situ- 2011 [facility]), Carpal tunnel syndrome, Depression, Hypercholesterolemia,  Hypothyroidism, Osteopenia, Covid 19 twice( 02-2019/ infusion of viral medication, and again 11-2020, CSA on CPAP.Pneumonia, and Ulcerative colitis since age 56 [facility]   PAIN:  Are you having pain? No  FALLS: Has patient fallen in last 6 months?  No  LIVING ENVIRONMENT: Lives with: lives with their spouse; [place] Lives in: House/apartment  PLOF:  Level of assistance: Independent with ADLs, Independent with IADLs Employment: Retired; Runner, broadcasting/film/video   PATIENT GOALS: word finding   OBJECTIVE:  Note: Objective measures were completed at Evaluation unless otherwise noted.  DIAGNOSTIC FINDINGS: Per EMR:  IMPRESSION: 1. No acute intracranial abnormality. 2. But suspicion of disproportionate volume loss in the mesial temporal lobes, left greater than right, raising the possibility of Neurodegenerative disease. 3. Mild to moderate for age cerebral white matter signal changes, nonspecific but most commonly due to small chronic small vessel disease.     Electronically Signed   By: VEAR Hurst M.D.   On: 06/10/2023 11:08  COGNITION: Overall cognitive status: Impaired Areas of impairment:  Attention: Impaired: Selective, Alternating, Divided Memory: Impaired: Working Teacher, music term Archivist function: Impaired: Organization and Planning Functional deficits: Recall of basic information; husband was not here to provide additional insight.  COGNITIVE COMMUNICATION: Following directions: Follows multi-step commands inconsistently and with increased time Auditory comprehension: Impaired: Requires repetition; to be further evaluated  Verbal expression: Impaired: Word finding/word retrieval Functional communication: Impaired: Impacting conversations with family  ORAL MOTOR EXAMINATION: Overall status: Did not assess Comments: NA  STANDARDIZED ASSESSMENTS:   Cognitive Linguistic Quick Test: AGE - 18 - 69   The Cognitive Linguistic Quick Test (CLQT) was administered to  assess the relative status of five cognitive domains: attention, memory, language, executive functioning, and visuospatial skills. Scores from 10 tasks were used to estimate severity ratings (standardized for age groups 18-69 years and 70-89 years) for each domain, a clock drawing task, as well as an overall composite severity rating of cognition.       Task Score Criterion Cut Scores  Personal Facts 8/8 8  Symbol Cancellation 6/12 11  Confrontation Naming 7/10 10  Clock Drawing  9/13 12  Story Retelling 7/10 6  Symbol Trails 8/10 9  Generative Naming 4/9 5  Design Memory -/6 5  Mazes  8/8 7  Design Generation 7/13 6    Cognitive Domain Composite Score Severity Rating  Attention ***/215 {SLPseverity:25491}  Memory ***/185 {SLPseverity:25491}  Executive Function ***/40 {SLPseverity:25491}  Language ***/37 {SLPseverity:25491}  Visuospatial Skills ***/105 {SLPseverity:25491}  Clock Drawing  ***/13 {SLPseverity:25491}  Composite Severity Rating  {SLPseverity:25491}       PATIENT REPORTED OUTCOME MEASURES (PROM): Neuro - Cognitive  TREATMENT DATE:   10/19/23: We've gotten used to the missing words/missing phrasesIn the last 3 months, pt has been losing some long term memory - not recalling a recipe that she had had multiple times. Not recalling ingredients used in recipe. If she waits long enough, the word may come out.   Husband does report frustration with communication at home. Unable to continue train of thought, and losing words.   2-step: 100% Narrative Discourse (Simple) Yes/no questions: 5/8     PATIENT EDUCATION: Education details: SLP role in Cognitive-Communication Person educated: Patient Education method: Explanation Education comprehension: verbalized understanding and needs further education   GOALS: Goals reviewed with patient?  No  SHORT TERM GOALS: Target date: 10/17/23  Complete CLQT/language measures, PROM, and update goals Baseline: Goal status: INITIAL   LONG TERM GOALS: Target date: 11/18/23  Improve score on PROM Baseline:  Goal status: INITIAL  2.   Baseline:  Goal status: INITIAL  3.   Baseline:  Goal status: INITIAL  4.   Baseline:  Goal status: INITIAL  5.   Baseline:  Goal status: INITIAL  6.   Baseline:  Goal status: INITIAL  ASSESSMENT:  CLINICAL IMPRESSION: Pt is a 73 yo female who presents to ST OP for evaluation with complaints of cognitive changes. Pt endorses difficulty retrieving words - 2-5 minutes later the word comes to her. Pt reports the more complicated the information is, the more difficulty she has recalling information. No anxiety/depression. Pt reports she has gotten used to the word finding deficits, and tries to not to take it too seriously. Pt was assessed using CLQT- see above for details. To complete next session. SLP observed pt requiring repetition of basic instructions. Pt was observed to have most difficulty with generating items in categories - SLP to assess auditory comprehension and expression further. SLP rec skilled ST services to address cognitive-linguistic impairment to maximize functional communication.    OBJECTIVE IMPAIRMENTS: include attention, memory, expressive language, and receptive language. These impairments are limiting patient from managing medications, managing appointments, managing finances, household responsibilities, ADLs/IADLs, and effectively communicating at home and in community. Factors affecting potential to achieve goals and functional outcome are NA.SABRA Patient will benefit from skilled SLP services to address above impairments and improve overall function.  REHAB POTENTIAL: Good  PLAN:  SLP FREQUENCY: 1x/week  SLP DURATION: other: 5 weeks; pt will be gone all of October. To recert at the end of September if needed.    PLANNED INTERVENTIONS: Language facilitation, Environmental controls, Cueing hierachy, Cognitive reorganization, Internal/external aids, Functional tasks, Multimodal communication approach, SLP instruction and feedback, Compensatory strategies, Patient/family education, and 07492 Treatment of speech (30 or 45 min)     [employer], CCC-SLP 10/19/2023, 8:59 AM

## 2023-10-20 DIAGNOSIS — Z860100 Personal history of colon polyps, unspecified: Secondary | ICD-10-CM | POA: Diagnosis not present

## 2023-10-20 DIAGNOSIS — K5150 Left sided colitis without complications: Secondary | ICD-10-CM | POA: Diagnosis not present

## 2023-10-20 DIAGNOSIS — D123 Benign neoplasm of transverse colon: Secondary | ICD-10-CM | POA: Diagnosis not present

## 2023-10-20 DIAGNOSIS — K5289 Other specified noninfective gastroenteritis and colitis: Secondary | ICD-10-CM | POA: Diagnosis not present

## 2023-10-20 LAB — HM COLONOSCOPY

## 2023-10-21 ENCOUNTER — Encounter: Payer: Self-pay | Admitting: Neurology

## 2023-10-24 DIAGNOSIS — D123 Benign neoplasm of transverse colon: Secondary | ICD-10-CM | POA: Diagnosis not present

## 2023-10-24 DIAGNOSIS — K5289 Other specified noninfective gastroenteritis and colitis: Secondary | ICD-10-CM | POA: Diagnosis not present

## 2023-10-25 ENCOUNTER — Encounter: Payer: Self-pay | Admitting: Family Medicine

## 2023-10-26 ENCOUNTER — Encounter: Payer: Self-pay | Admitting: Speech Pathology

## 2023-10-26 ENCOUNTER — Ambulatory Visit: Admitting: Speech Pathology

## 2023-10-26 DIAGNOSIS — R4701 Aphasia: Secondary | ICD-10-CM

## 2023-10-26 DIAGNOSIS — R41841 Cognitive communication deficit: Secondary | ICD-10-CM | POA: Diagnosis not present

## 2023-10-26 NOTE — Therapy (Signed)
 OUTPATIENT SPEECH LANGUAGE PATHOLOGY TREATMENT   Patient Name: Stephanie Ramsey MRN: [id number] DOB:06/28/1950, 73 y.o., female Today's Date: 10/26/2023  PCP: Alvia Bring, DO REFERRING PROVIDER: Dohmeier, Dedra, MD  END OF SESSION:  End of Session - 10/26/23 0913     Visit Number 3    Number of Visits 9    Date for SLP Re-Evaluation 12/06/23    SLP Start Time 0845    SLP Stop Time  0930    SLP Time Calculation (min) 45 min    Activity Tolerance Patient tolerated treatment well          Past Medical History:  Diagnosis Date   Arthritis    Cancer (HCC)    right breast   Carpal tunnel syndrome    Chronic back pain    Complication of anesthesia    stopped breathing during procedure and dizziness after procedures   Depression    Hypercholesterolemia    Hypothyroidism    Leg cramps    Osteopenia    Pneumonia    Ulcerative colitis (HCC)    Past Surgical History:  Procedure Laterality Date   BILATERAL CARPAL TUNNEL RELEASE     BREAST BIOPSY Right 2011   In Situ DCIS   BREAST LUMPECTOMY Right 2011   in situ    BREAST SURGERY     biopsy and lumpectomy right breast   CHOLECYSTECTOMY     COLONOSCOPY W/ BIOPSIES AND POLYPECTOMY     JOINT REPLACEMENT     Total right hip   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   Procedure: LEFT TOTAL HIP ARTHROPLASTY;  Surgeon: Maude LELON Right, MD;  Location: [facility] OR;  Service: Orthopedics;  Laterality: Left;   TRIGGER FINGER RELEASE     Patient Active Problem List   Diagnosis Date Noted   Right foot pain first MTP 10/06/2023   Tick bite 08/23/2023   Left temporal headache 05/13/2023   DDD (degenerative disc disease), cervical 02/27/2023   Need for hepatitis C screening test 01/06/2023   Raynaud's disease without gangrene 09/28/2022   Pain of left heel 09/28/2022   Fall 05/04/2022   Neck pain 05/04/2022   Rib pain on right side 12/24/2021   Chronic intermittent hypoxia with obstructive sleep apnea 08/24/2021   Complex sleep  apnea syndrome 08/24/2021   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   Non-restorative sleep 06/29/2021   Snoring 06/29/2021   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   Morning headache 06/16/2021   Essential tremor 06/16/2021   Rib pain on left side 01/12/2021   Left wrist pain 01/12/2021   Pleuritic chest pain 12/16/2020   Tenosynovitis of right wrist 12/03/2020   Abnormal prominence of clavicle 10/20/2020   Lumbar degenerative disc disease 12/10/2019   Benign paroxysmal positional vertigo of right ear 08/16/2019   History of adenomatous polyp of colon 01/26/2018   Trigger ring finger of right hand 11/16/2017   Leg cramps 06/02/2016   Encounter for Medicare annual wellness exam 08/11/2015   Hyperlipidemia 07/26/2015   Hypothyroidism 07/26/2015   Ulcerative colitis (HCC) 07/26/2015   Subcutaneous nodules 07/26/2015   Pain in joints 07/26/2015   Osteopenia 07/26/2015   Neutropenia (HCC) 07/26/2015   Neck muscle spasm 07/26/2015   Malignant neoplasm of breast (HCC) 07/26/2015   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   IFG (impaired fasting glucose) 07/26/2015   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   Primary osteoarthritis of left hip 01/03/2014   S/P revision of  total hip 01/01/2014    ONSET DATE: Referred on 09/20/23   REFERRING DIAG:  R41.841 (ICD-10-CM) - Cognitive communication disorder  Z87.898 (ICD-10-CM) - History of short term memory loss    THERAPY DIAG:  Aphasia  Cognitive communication deficit  Rationale for Evaluation and Treatment: Rehabilitation  SUBJECTIVE:   SUBJECTIVE STATEMENT: Pt reports husband is ill and wasn't able to attend.   Pt accompanied by: self  PERTINENT HISTORY: Per EMR: Hip Arthritis and replacement in 2011 and second in 2015, revision in 2018. Breast Cancer, right breast, level in-situ- 2011 [facility]), Carpal tunnel syndrome, Depression, Hypercholesterolemia,  Hypothyroidism, Osteopenia, Covid 19 twice( 02-2019/ infusion of viral medication, and again 11-2020, CSA on CPAP.Pneumonia, and Ulcerative colitis since age 1 [facility]).   PAIN:  Are you having pain? No  FALLS: Has patient fallen in last 6 months?  No  LIVING ENVIRONMENT: Lives with: lives with their spouse; [place] Lives in: House/apartment  PLOF:  Level of assistance: Independent with ADLs, Independent with IADLs Employment: Retired; Runner, broadcasting/film/video   PATIENT GOALS: word finding   OBJECTIVE:  Note: Objective measures were completed at Evaluation unless otherwise noted.  DIAGNOSTIC FINDINGS: Per EMR:  IMPRESSION: 1. No acute intracranial abnormality. 2. But suspicion of disproportionate volume loss in the mesial temporal lobes, left greater than right, raising the possibility of Neurodegenerative disease. 3. Mild to moderate for age cerebral white matter signal changes, nonspecific but most commonly due to small chronic small vessel disease.     Electronically Signed   By: VEAR Hurst M.D.   On: 06/10/2023 11:08  COGNITION: Overall cognitive status: Impaired Areas of impairment:  Attention: Impaired: Selective, Alternating, Divided Memory: Impaired: Working Teacher, music term Archivist function: Impaired: Organization and Planning Functional deficits: Recall of basic information; husband was not here to provide additional insight.  COGNITIVE COMMUNICATION: Following directions: Follows multi-step commands inconsistently and with increased time Auditory comprehension: Impaired: Requires repetition; to be further evaluated  Verbal expression: Impaired: Word finding/word retrieval Functional communication: Impaired: Impacting conversations with family  ORAL MOTOR EXAMINATION: Overall status: Did not assess Comments: NA  STANDARDIZED ASSESSMENTS:   Cognitive Linguistic Quick Test: AGE - 18 - 69   The Cognitive Linguistic Quick Test (CLQT) was administered to  assess the relative status of five cognitive domains: attention, memory, language, executive functioning, and visuospatial skills. Scores from 10 tasks were used to estimate severity ratings (standardized for age groups 18-69 years and 70-89 years) for each domain, a clock drawing task, as well as an overall composite severity rating of cognition.       Task Score Criterion Cut Scores  Personal Facts 8/8 8  Symbol Cancellation 6/12 11  Confrontation Naming 7/10 10  Clock Drawing  9/13 12  Story Retelling 7/10 6  Symbol Trails 8/10 9  Generative Naming 4/9 5  Design Memory 5/6 5  Mazes  8/8 7  Design Generation 7/13 6      PATIENT REPORTED OUTCOME MEASURES (PROM): Neuro - Cognitive:  121/40; husband scored patient lower in several areas.  TREATMENT DATE:   10/26/23: Pt was seen for skilled ST services targeting word finding. Pt's husband was unable to join today due to not feeling well. To discuss communication strategies at next visit. Pt reports people will try to fill in the word for her which can make her frustrated because she wants to try to do it independently first. SLP educated on word finding strategies this session re: delay, describe, association, synonym, first letter, gesture, look it up, draw, narrow it down, and come back later. Pt feels like these will be super helpful for her moving forward. SLP provided brief education on Semantic Feature Analysis (SFA) to continue with describe  next session if time allows.   10/19/23: Pt was seen for skilled ST services targeting continued assessment and completion of PROM measures. Pt and husband report re We've gotten used to the missing words/missing phrases.  Pt reported that if she waits long enough, eventually she will be able to retrieve the word she is looking for. Husband shared that in the last 3 months,  pt has been losing some long term memory - not recalling a recipe that she had had multiple times, as well as, not recalling ingredients used in recipe. Husband does report frustration with communication at home and reports this impacts them both. SLP to follow up with communication strategies next session.  Continued assessment for language re: 2-step: 100% Narrative Discourse (Simple) Yes/no questions: 5/8     PATIENT EDUCATION: Education details: SLP role in Cognitive-Communication Person educated: Patient Education method: Explanation Education comprehension: verbalized understanding and needs further education   GOALS: Goals reviewed with patient? No  SHORT TERM GOALS: Target date: 10/17/23  Complete CLQT/language measures, PROM, and update goals Baseline: Goal status: MET 2. Pt/husband will recall use of communication strategies to support aphasia  Baseline:  Goal status: INITIAL 3. Pt will recall 2+ word finding strategies  Baseline:  Goal status: INITIAL   LONG TERM GOALS: Target date: 11/18/23  Improve score on PROM Baseline:  Goal status: INITIAL  2.  Pt/husband will report successful use of communication strategies to support aphasia Baseline:  Goal status: INITIAL  3.  Pt/husband will report successful use of word finding strategies in conversations Baseline:  Goal status: INITIAL    ASSESSMENT:  CLINICAL IMPRESSION: Pt is a 73 yo female who presents to ST OP for evaluation with complaints of cognitive changes. Pt endorses difficulty retrieving words - 2-5 minutes later the word comes to her. Pt reports the more complicated the information is, the more difficulty she has recalling information. No anxiety/depression. Pt reports she has gotten used to the word finding deficits, and tries to not to take it too seriously. Pt was assessed using CLQT- see above for details. To complete next session. SLP observed pt requiring repetition of basic instructions. Pt  was observed to have most difficulty with generating items in categories - SLP to assess auditory comprehension and expression further. SLP rec skilled ST services to address cognitive-linguistic impairment to maximize functional communication.    OBJECTIVE IMPAIRMENTS: include attention, memory, expressive language, and receptive language. These impairments are limiting patient from managing medications, managing appointments, managing finances, household responsibilities, ADLs/IADLs, and effectively communicating at home and in community. Factors affecting potential to achieve goals and functional outcome are NA.SABRA Patient will benefit from skilled SLP services to address above impairments and improve overall function.  REHAB POTENTIAL: Good  PLAN:  SLP FREQUENCY: 1x/week  SLP DURATION: other: 5 weeks; pt will be gone  all of October. To recert at the end of September if needed.   PLANNED INTERVENTIONS: Language facilitation, Environmental controls, Cueing hierachy, Cognitive reorganization, Internal/external aids, Functional tasks, Multimodal communication approach, SLP instruction and feedback, Compensatory strategies, Patient/family education, and 07492 Treatment of speech (30 or 45 min)     [employer], CCC-SLP 10/26/2023, 9:15 AM

## 2023-11-09 ENCOUNTER — Encounter: Payer: Self-pay | Admitting: Speech Pathology

## 2023-11-09 ENCOUNTER — Ambulatory Visit: Admitting: Speech Pathology

## 2023-11-09 DIAGNOSIS — R4701 Aphasia: Secondary | ICD-10-CM | POA: Diagnosis not present

## 2023-11-09 DIAGNOSIS — R41841 Cognitive communication deficit: Secondary | ICD-10-CM | POA: Diagnosis not present

## 2023-11-09 DIAGNOSIS — M170 Bilateral primary osteoarthritis of knee: Secondary | ICD-10-CM | POA: Diagnosis not present

## 2023-11-09 NOTE — Therapy (Signed)
 OUTPATIENT SPEECH LANGUAGE PATHOLOGY TREATMENT   Patient Name: Stephanie Ramsey MRN: [id number] DOB:1951-02-15, 73 y.o., female Today's Date: 11/09/2023  PCP: Alvia Bring, DO REFERRING PROVIDER: Dohmeier, Dedra, MD  END OF SESSION:  End of Session - 11/09/23 0852     Visit Number 4    Number of Visits 9    Date for Recertification  12/06/23    SLP Start Time 0850    SLP Stop Time  0930    SLP Time Calculation (min) 40 min    Activity Tolerance Patient tolerated treatment well          Past Medical History:  Diagnosis Date   Arthritis    Cancer (HCC)    right breast   Carpal tunnel syndrome    Chronic back pain    Complication of anesthesia    stopped breathing during procedure and dizziness after procedures   Depression    Hypercholesterolemia    Hypothyroidism    Leg cramps    Osteopenia    Pneumonia    Ulcerative colitis (HCC)    Past Surgical History:  Procedure Laterality Date   BILATERAL CARPAL TUNNEL RELEASE     BREAST BIOPSY Right 2011   In Situ DCIS   BREAST LUMPECTOMY Right 2011   in situ    BREAST SURGERY     biopsy and lumpectomy right breast   CHOLECYSTECTOMY     COLONOSCOPY W/ BIOPSIES AND POLYPECTOMY     JOINT REPLACEMENT     Total right hip   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   Procedure: LEFT TOTAL HIP ARTHROPLASTY;  Surgeon: Maude LELON Right, MD;  Location: [facility] OR;  Service: Orthopedics;  Laterality: Left;   TRIGGER FINGER RELEASE     Patient Active Problem List   Diagnosis Date Noted   Right foot pain first MTP 10/06/2023   Tick bite 08/23/2023   Left temporal headache 05/13/2023   DDD (degenerative disc disease), cervical 02/27/2023   Need for hepatitis C screening test 01/06/2023   Raynaud's disease without gangrene 09/28/2022   Pain of left heel 09/28/2022   Fall 05/04/2022   Neck pain 05/04/2022   Rib pain on right side 12/24/2021   Chronic intermittent hypoxia with obstructive sleep apnea 08/24/2021   Complex sleep  apnea syndrome 08/24/2021   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   Non-restorative sleep 06/29/2021   Snoring 06/29/2021   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   Morning headache 06/16/2021   Essential tremor 06/16/2021   Rib pain on left side 01/12/2021   Left wrist pain 01/12/2021   Pleuritic chest pain 12/16/2020   Tenosynovitis of right wrist 12/03/2020   Abnormal prominence of clavicle 10/20/2020   Lumbar degenerative disc disease 12/10/2019   Benign paroxysmal positional vertigo of right ear 08/16/2019   History of adenomatous polyp of colon 01/26/2018   Trigger ring finger of right hand 11/16/2017   Leg cramps 06/02/2016   Encounter for Medicare annual wellness exam 08/11/2015   Hyperlipidemia 07/26/2015   Hypothyroidism 07/26/2015   Ulcerative colitis (HCC) 07/26/2015   Subcutaneous nodules 07/26/2015   Pain in joints 07/26/2015   Osteopenia 07/26/2015   Neutropenia 07/26/2015   Neck muscle spasm 07/26/2015   Malignant neoplasm of breast (HCC) 07/26/2015   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   IFG (impaired fasting glucose) 07/26/2015   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   Primary osteoarthritis of left hip 01/03/2014   S/P revision of total  hip 01/01/2014    ONSET DATE: Referred on 09/20/23   REFERRING DIAG:  R41.841 (ICD-10-CM) - Cognitive communication disorder  Z87.898 (ICD-10-CM) - History of short term memory loss    THERAPY DIAG:  Aphasia  Cognitive communication deficit  Rationale for Evaluation and Treatment: Rehabilitation  SUBJECTIVE:   SUBJECTIVE STATEMENT: Pt reports she had a good vacation.   Pt accompanied by: self  PERTINENT HISTORY: Per EMR: Hip Arthritis and replacement in 2011 and second in 2015, revision in 2018. Breast Cancer, right breast, level in-situ- 2011 [facility]), Carpal tunnel syndrome, Depression, Hypercholesterolemia, Hypothyroidism, Osteopenia,  Covid 19 twice( 02-2019/ infusion of viral medication, and again 11-2020, CSA on CPAP.Pneumonia, and Ulcerative colitis since age 19 [facility]).   PAIN:  Are you having pain? No  FALLS: Has patient fallen in last 6 months?  No  LIVING ENVIRONMENT: Lives with: lives with their spouse; [place] Lives in: House/apartment  PLOF:  Level of assistance: Independent with ADLs, Independent with IADLs Employment: Retired; Runner, broadcasting/film/video   PATIENT GOALS: word finding   OBJECTIVE:  Note: Objective measures were completed at Evaluation unless otherwise noted.  DIAGNOSTIC FINDINGS: Per EMR:  IMPRESSION: 1. No acute intracranial abnormality. 2. But suspicion of disproportionate volume loss in the mesial temporal lobes, left greater than right, raising the possibility of Neurodegenerative disease. 3. Mild to moderate for age cerebral white matter signal changes, nonspecific but most commonly due to small chronic small vessel disease.     Electronically Signed   By: VEAR Hurst M.D.   On: 06/10/2023 11:08  COGNITION: Overall cognitive status: Impaired Areas of impairment:  Attention: Impaired: Selective, Alternating, Divided Memory: Impaired: Working Teacher, music term Archivist function: Impaired: Organization and Planning Functional deficits: Recall of basic information; husband was not here to provide additional insight.  COGNITIVE COMMUNICATION: Following directions: Follows multi-step commands inconsistently and with increased time Auditory comprehension: Impaired: Requires repetition; to be further evaluated  Verbal expression: Impaired: Word finding/word retrieval Functional communication: Impaired: Impacting conversations with family  ORAL MOTOR EXAMINATION: Overall status: Did not assess Comments: NA  STANDARDIZED ASSESSMENTS:   Cognitive Linguistic Quick Test: AGE - 18 - 69   The Cognitive Linguistic Quick Test (CLQT) was administered to assess the relative status of  five cognitive domains: attention, memory, language, executive functioning, and visuospatial skills. Scores from 10 tasks were used to estimate severity ratings (standardized for age groups 18-69 years and 70-89 years) for each domain, a clock drawing task, as well as an overall composite severity rating of cognition.       Task Score Criterion Cut Scores  Personal Facts 8/8 8  Symbol Cancellation 6/12 11  Confrontation Naming 7/10 10  Clock Drawing  9/13 12  Story Retelling 7/10 6  Symbol Trails 8/10 9  Generative Naming 4/9 5  Design Memory 5/6 5  Mazes  8/8 7  Design Generation 7/13 6      PATIENT REPORTED OUTCOME MEASURES (PROM): Neuro - Cognitive:  121/40; husband scored patient lower in several areas.  TREATMENT DATE:   11/09/23: Pt was seen for skilled ST services targeting word finding. She felt communication went well over her trip and husband reports improvement. She has been using describe and first letter most successfully. SLP facilitated word finding using Semantic Feature Analysis (SFA) and Expanding Expression Tool. Pt required minA-modA to identify features. Completed session with pt describing basic objects. She required overall minA-modA verbal cues and occasional prompting questions.   10/26/23: Pt was seen for skilled ST services targeting word finding. Pt's husband was unable to join today due to not feeling well. To discuss communication strategies at next visit. Pt reports people will try to fill in the word for her which can make her frustrated because she wants to try to do it independently first. SLP educated on word finding strategies this session re: delay, describe, association, synonym, first letter, gesture, look it up, draw, narrow it down, and come back later. Pt feels like these will be super helpful for her moving forward. SLP provided  brief education on Semantic Feature Analysis (SFA) to continue with describe  next session if time allows.   10/19/23: Pt was seen for skilled ST services targeting continued assessment and completion of PROM measures. Pt and husband report re We've gotten used to the missing words/missing phrases.  Pt reported that if she waits long enough, eventually she will be able to retrieve the word she is looking for. Husband shared that in the last 3 months, pt has been losing some long term memory - not recalling a recipe that she had had multiple times, as well as, not recalling ingredients used in recipe. Husband does report frustration with communication at home and reports this impacts them both. SLP to follow up with communication strategies next session.  Continued assessment for language re: 2-step: 100% Narrative Discourse (Simple) Yes/no questions: 5/8     PATIENT EDUCATION: Education details: SLP role in Cognitive-Communication Person educated: Patient Education method: Explanation Education comprehension: verbalized understanding and needs further education   GOALS: Goals reviewed with patient? No  SHORT TERM GOALS: Target date: 10/17/23  Complete CLQT/language measures, PROM, and update goals Baseline: Goal status: MET 2. Pt/husband will recall use of communication strategies to support aphasia  Baseline:  Goal status: INITIAL 3. Pt will recall 2+ word finding strategies  Baseline:  Goal status: INITIAL   LONG TERM GOALS: Target date: 11/18/23  Improve score on PROM Baseline:  Goal status: INITIAL  2.  Pt/husband will report successful use of communication strategies to support aphasia Baseline:  Goal status: INITIAL  3.  Pt/husband will report successful use of word finding strategies in conversations Baseline:  Goal status: INITIAL    ASSESSMENT:  CLINICAL IMPRESSION: Pt is a 73 yo female who presents to ST OP for evaluation with complaints of cognitive  changes. Pt endorses difficulty retrieving words - 2-5 minutes later the word comes to her. Pt reports the more complicated the information is, the more difficulty she has recalling information. No anxiety/depression. Pt reports she has gotten used to the word finding deficits, and tries to not to take it too seriously. Pt was assessed using CLQT- see above for details. To complete next session. SLP observed pt requiring repetition of basic instructions. Pt was observed to have most difficulty with generating items in categories - SLP to assess auditory comprehension and expression further. SLP rec skilled ST services to address cognitive-linguistic impairment to maximize functional communication.    OBJECTIVE IMPAIRMENTS: include attention, memory, expressive language, and  receptive language. These impairments are limiting patient from managing medications, managing appointments, managing finances, household responsibilities, ADLs/IADLs, and effectively communicating at home and in community. Factors affecting potential to achieve goals and functional outcome are NA.SABRA Patient will benefit from skilled SLP services to address above impairments and improve overall function.  REHAB POTENTIAL: Good  PLAN:  SLP FREQUENCY: 1x/week  SLP DURATION: other: 5 weeks; pt will be gone all of October. To recert at the end of September if needed.   PLANNED INTERVENTIONS: Language facilitation, Environmental controls, Cueing hierachy, Cognitive reorganization, Internal/external aids, Functional tasks, Multimodal communication approach, SLP instruction and feedback, Compensatory strategies, Patient/family education, and 07492 Treatment of speech (30 or 45 min)     [employer], CCC-SLP 11/09/2023, 8:53 AM

## 2023-11-09 NOTE — Telephone Encounter (Unsigned)
 Copied from CRM #[id number]. Topic: Clinical - Medication Refill >> Nov 09, 2023  2:32 PM Darshell M wrote: Last refill was for 30 days and she needs a refill for 90 days. Per patient she takes 3x per day.  Medication: sulfaSALAzine  (AZULFIDINE ) 500 MG tablet  Has the patient contacted their pharmacy? No (Agent: If no, request that the patient contact the pharmacy for the refill. If patient does not wish to contact the pharmacy document the reason why and proceed with request.) (Agent: If yes, when and what did the pharmacy advise?)  This is the patient's preferred pharmacy:  [id number] [address], [place] - [address] [place] [address] Phone: [phone] Fax: [phone]  Is this the correct pharmacy for this prescription? No If no, delete pharmacy and type the correct one.   Has the prescription been filled recently? No  Is the patient out of the medication? Yes  Has the patient been seen for an appointment in the last year OR does the patient have an upcoming appointment? yes  Can we respond through MyChart? No  Agent: Please be advised that Rx refills may take up to 3 business days. We ask that you follow-up with your pharmacy.

## 2023-11-10 ENCOUNTER — Ambulatory Visit: Admitting: Sports Medicine

## 2023-11-14 ENCOUNTER — Other Ambulatory Visit: Payer: Self-pay

## 2023-11-14 ENCOUNTER — Ambulatory Visit
Admission: EM | Admit: 2023-11-14 | Discharge: 2023-11-14 | Disposition: A | Discharge status: Other Acute Inpatient Hospital | Attending: Family Medicine | Admitting: Family Medicine

## 2023-11-14 ENCOUNTER — Ambulatory Visit: Payer: Self-pay

## 2023-11-14 ENCOUNTER — Emergency Department (HOSPITAL_COMMUNITY)

## 2023-11-14 ENCOUNTER — Emergency Department (HOSPITAL_COMMUNITY)
Admission: EM | Admit: 2023-11-14 | Discharge: 2023-11-14 | Discharge status: Against Medical Advice | Source: Ambulatory Visit | Attending: Emergency Medicine | Admitting: Emergency Medicine

## 2023-11-14 DIAGNOSIS — R0602 Shortness of breath: Secondary | ICD-10-CM | POA: Insufficient documentation

## 2023-11-14 DIAGNOSIS — R9431 Abnormal electrocardiogram [ECG] [EKG]: Secondary | ICD-10-CM | POA: Diagnosis not present

## 2023-11-14 DIAGNOSIS — R008 Other abnormalities of heart beat: Secondary | ICD-10-CM

## 2023-11-14 DIAGNOSIS — R079 Chest pain, unspecified: Secondary | ICD-10-CM | POA: Insufficient documentation

## 2023-11-14 DIAGNOSIS — Z5321 Procedure and treatment not carried out due to patient leaving prior to being seen by health care provider: Secondary | ICD-10-CM | POA: Insufficient documentation

## 2023-11-14 DIAGNOSIS — R0789 Other chest pain: Secondary | ICD-10-CM | POA: Diagnosis not present

## 2023-11-14 HISTORY — DX: Aphasia: R47.01

## 2023-11-14 LAB — CBC
HCT: 43 % (ref 36.0–46.0)
Hemoglobin: 14.1 g/dL (ref 12.0–15.0)
MCH: 32.6 pg (ref 26.0–34.0)
MCHC: 32.8 g/dL (ref 30.0–36.0)
MCV: 99.5 fL (ref 80.0–100.0)
Platelets: 265 K/uL (ref 150–400)
RBC: 4.32 MIL/uL (ref 3.87–5.11)
RDW: 12.8 % (ref 11.5–15.5)
WBC: 7.2 K/uL (ref 4.0–10.5)
nRBC: 0 % (ref 0.0–0.2)

## 2023-11-14 LAB — BASIC METABOLIC PANEL WITH GFR
Anion gap: 12 (ref 5–15)
BUN: 18 mg/dL (ref 8–23)
CO2: 24 mmol/L (ref 22–32)
Calcium: 9.7 mg/dL (ref 8.9–10.3)
Chloride: 102 mmol/L (ref 98–111)
Creatinine, Ser: 0.82 mg/dL (ref 0.44–1.00)
GFR, Estimated: >60 mL/min (ref >60)
Glucose, Bld: 105 mg/dL — ABNORMAL HIGH (ref 70–99)
Potassium: 4.1 mmol/L (ref 3.5–5.1)
Sodium: 138 mmol/L (ref 135–145)

## 2023-11-14 LAB — TROPONIN T, HIGH SENSITIVITY: Troponin T High Sensitivity: <15 ng/L (ref 0–19)

## 2023-11-14 NOTE — Telephone Encounter (Signed)
 FYI Only or Action Required?: Action required by provider: request for appointment.  Patient was last seen in primary care on 10/06/2023 by Curtis Debby PARAS, MD.  Called Nurse Triage reporting Breathing Problem.  Symptoms began a week ago.  Interventions attempted: Nothing.  Symptoms are: gradually worsening.  Triage Disposition: See HCP Within 4 Hours (Or PCP Triage)  Patient/caregiver understands and will follow disposition?: Yes  Copied from CRM #[id number]. Topic: Clinical - Red Word Triage >> Nov 14, 2023  1:31 PM Rosaria E wrote: Red Word that prompted transfer to Nurse Triage: Breathing difficulty, chest discomfort Reason for Disposition  [1] MILD difficulty breathing (e.g., minimal/no SOB at rest, SOB with walking, pulse < 100) AND [2] NEW-onset or WORSE than normal  Answer Assessment - Initial Assessment Questions No available appt today. Advised UC/ED today.  1. RESPIRATORY STATUS: Describe your breathing? (e.g., wheezing, shortness of breath, unable to speak, severe coughing)      Hard time breathing deeply, centered chest tightness; between breast, heavy pressure, have to breath hard to get deep breath; It's not pain, just feel it. Denies back pain, arm pain, n/v/d, faint,dizziness 2. ONSET: When did this breathing problem begin?      Week ago, last 2 days has been happening more often 3. PATTERN Does the difficult breathing come and go, or has it been constant since it started?      intermittent 4. SEVERITY: How bad is your breathing? (e.g., mild, moderate, severe)      moderate 5. RECURRENT SYMPTOM: Have you had difficulty breathing before? If Yes, ask: When was the last time? and What happened that time?      no 6. CARDIAC HISTORY: Do you have any history of heart disease? (e.g., heart attack, angina, bypass surgery, angioplasty)      no 7. LUNG HISTORY: Do you have any history of lung disease?  (e.g., pulmonary embolus, asthma,  emphysema)     no 8. CAUSE: What do you think is causing the breathing problem?      Not sure, been trying to do things around the house 9. OTHER SYMPTOMS: Do you have any other symptoms? (e.g., chest pain, cough, dizziness, fever, runny nose)     Denies coughing, fever, dizziness 10. O2 SATURATION MONITOR:  Do you use an oxygen saturation monitor (pulse oximeter) at home? If Yes, ask: What is your reading (oxygen level) today? What is your usual oxygen saturation reading? (e.g., 95%)       N/a 12. TRAVEL: Have you traveled out of the country in the last month? (e.g., travel history, exposures)       no  Protocols used: Breathing Difficulty-A-AH

## 2023-11-14 NOTE — Discharge Instructions (Addendum)
 Please go to the emergency room for further evaluation of your chest tightness and abnormal EKG

## 2023-11-14 NOTE — ED Notes (Signed)
 Patient is being discharged from the Urgent Care and sent to the Emergency Department via POV . Per Myla Bold, NP, patient is in need of higher level of care due to needing further testing. Patient is aware and verbalizes understanding of plan of care.  Vitals:   11/14/23 1524  BP: 128/81  Pulse: 65  Resp: 16  Temp: 98.2 F (36.8 C)  SpO2: 98%

## 2023-11-14 NOTE — Telephone Encounter (Signed)
Patient seen at urgent care today .

## 2023-11-14 NOTE — ED Notes (Signed)
 Patient stated she wants to leave and remove her IV out.

## 2023-11-14 NOTE — ED Triage Notes (Incomplete)
 Patient c/o recurrent chest pain and SOB x 2 weeks. Patient report worsening chest pain when taking a deep breath. Patient denies N/V.

## 2023-11-14 NOTE — ED Provider Notes (Signed)
 [facility]    CSN: [id number] Arrival date & time: 11/14/23  1404      History   Chief Complaint Chief Complaint  Patient presents with   Shortness of Breath    HPI Stephanie Ramsey is a 73 y.o. female presents for chest tightness.  Patient reports several weeks of intermittent, midsternal chest tightness that over the past couple days has been more persistent.  States sometimes she feel like she has to struggle to take a deep breath feels like something I never felt before across the entire chest even with little breaths.  She denies any chest pain, shortness of breath, dizziness, palpitations, diaphoresis, syncope.  She is a previous smoker from 40 years ago.  No asthma history.  Denies history of diabetes or hypertension but does take Lipitor for hyperlipidemia.  Denies any cough or cold symptoms over the past several weeks.  Denies any history of CAD.  Reports her brother and father had some type of heart issues but she does not know specifics.  She states symptoms do tend to worsen when she goes up her stairs but cannot identify any alleviating factors.  No other concerns at this time.   Shortness of Breath   Past Medical History:  Diagnosis Date   Aphasia    Arthritis    Cancer (HCC)    right breast   Carpal tunnel syndrome    Chronic back pain    Complication of anesthesia    stopped breathing during procedure and dizziness after procedures   Depression    Hypercholesterolemia    Hypothyroidism    Leg cramps    Osteopenia    Pneumonia    Ulcerative colitis [facility])     Patient Active Problem List   Diagnosis Date Noted   Right foot pain first MTP 10/06/2023   Tick bite 08/23/2023   Left temporal headache 05/13/2023   DDD (degenerative disc disease), cervical 02/27/2023   Need for hepatitis C screening test 01/06/2023   Raynaud's disease without gangrene 09/28/2022   Pain of left heel 09/28/2022   Fall 05/04/2022   Neck pain 05/04/2022   Rib pain on  right side 12/24/2021   Chronic intermittent hypoxia with obstructive sleep apnea 08/24/2021   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   Non-restorative sleep 06/29/2021   Snoring 06/29/2021   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   Morning headache 06/16/2021   Essential tremor 06/16/2021   Rib pain on left side 01/12/2021   Left wrist pain 01/12/2021   Pleuritic chest pain 12/16/2020   Tenosynovitis of right wrist 12/03/2020   Abnormal prominence of clavicle 10/20/2020   Lumbar degenerative disc disease 12/10/2019   Benign paroxysmal positional vertigo of right ear 08/16/2019   History of adenomatous polyp of colon 01/26/2018   Trigger ring finger of right hand 11/16/2017   Leg cramps 06/02/2016   Encounter for Medicare annual wellness exam 08/11/2015   Hyperlipidemia 07/26/2015   Hypothyroidism 07/26/2015   Ulcerative colitis (HCC) 07/26/2015   Subcutaneous nodules 07/26/2015   Pain in joints 07/26/2015   Osteopenia 07/26/2015   Neutropenia 07/26/2015   Neck muscle spasm 07/26/2015   Malignant neoplasm of breast (HCC) 07/26/2015   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   IFG (impaired fasting glucose) 07/26/2015   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   Primary osteoarthritis of left hip 01/03/2014   S/P revision of total hip 01/01/2014  Past Surgical History:  Procedure Laterality Date   BILATERAL CARPAL TUNNEL RELEASE     BREAST BIOPSY Right 2011   In Situ DCIS   BREAST LUMPECTOMY Right 2011   in situ    BREAST SURGERY     biopsy and lumpectomy right breast   CHOLECYSTECTOMY     COLONOSCOPY W/ BIOPSIES AND POLYPECTOMY     JOINT REPLACEMENT     Total right hip   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   Procedure: LEFT TOTAL HIP ARTHROPLASTY;  Surgeon: Maude LELON Right, MD;  Location: [facility] OR;  Service: Orthopedics;  Laterality: Left;   TRIGGER  FINGER RELEASE      OB History   No obstetric history on file.      Home Medications    Prior to Admission medications   Medication Sig Start Date End Date Taking? Authorizing Provider  atorvastatin  (LIPITOR) 20 MG tablet TAKE 1 TABLET BY MOUTH EVERY DAY 09/14/23   Alvia Bring, DO  diclofenac  Sodium (VOLTAREN ) 1 % GEL Apply 4 g topically 4 (four) times daily. To affected joint. 10/06/23   Curtis Debby PARAS, MD  escitalopram  (LEXAPRO ) 10 MG tablet TAKE 1 AND 1/2 TABLETS DAILY BY MOUTH 09/21/22   Alvia Bring, DO  gabapentin  (NEURONTIN ) 300 MG capsule Take 1 capsule (300 mg total) by mouth daily as needed. Patient taking differently: Take 500 mg by mouth daily as needed. 09/21/23 12/20/23  Alvia Bring, DO  levothyroxine  (SYNTHROID ) 137 MCG tablet TAKE 1 TABLET BY MOUTH EVERY DAY 09/26/23   Alvia Bring, DO  Magnesium  500 MG CAPS Take by mouth.    [provider]  memantine  (NAMENDA ) 5 MG tablet Take 1 tablet (5 mg total) by mouth 2 (two) times daily. 09/20/23   Dohmeier, Dedra, MD  Multiple Vitamins-Minerals (MULTIVITAMIN WITH MINERALS) tablet Take 1 tablet by mouth daily.    [provider]  Omega-3 Fatty Acids (FISH OIL) 1360 MG CAPS Take by mouth.    [provider]  potassium gluconate 595 (99 K) MG TABS tablet Take 595 mg by mouth.    [provider]  sulfaSALAzine  (AZULFIDINE ) 500 MG tablet Take 1 tablet (500 mg total) by mouth 3 (three) times daily. 10/07/23   Alvia Bring, DO    Family History Family History  Problem Relation Age of Onset   Breast cancer Mother    Kidney failure Father    Heart Problems Father    Colon cancer Sister    Lung cancer Brother    Arthritis Other     Social History Social History   Tobacco Use   Smoking status: Former    Current packs/day: 0.00    Types: Cigarettes    Quit date: 10/12/1979    Years since quitting: 44.1   Smokeless tobacco: Never   Tobacco comments:    quit smoking cigarettes  32 years ago  Vaping Use   Vaping status: Never Used  Substance Use Topics   Alcohol use: Yes    Alcohol/week: 3.0 standard drinks of alcohol    Types: 3 Glasses of wine per week    Comment: social   Drug use: No     Allergies   Oxycontin  [oxycodone ], Penicillins, and Propofol    Review of Systems Review of Systems  Respiratory:  Positive for chest tightness. Negative for shortness of breath.      Physical Exam Triage Vital Signs ED Triage Vitals  Encounter Vitals Group     BP 11/14/23 1524 128/81  Girls Systolic BP Percentile --      Girls Diastolic BP Percentile --      Boys Systolic BP Percentile --      Boys Diastolic BP Percentile --      Pulse Rate 11/14/23 1524 65     Resp 11/14/23 1524 16     Temp 11/14/23 1524 98.2 F (36.8 C)     Temp Source 11/14/23 1524 Oral     SpO2 11/14/23 1524 98 %     Weight --      Height --      Head Circumference --      Peak Flow --      Pain Score 11/14/23 1521 0     Pain Loc --      Pain Education --      Exclude from Growth Chart --    No data found.  Updated Vital Signs BP 128/81   Pulse 65   Temp 98.2 F (36.8 C) (Oral)   Resp 16   SpO2 98%   Visual Acuity Right Eye Distance:   Left Eye Distance:   Bilateral Distance:    Right Eye Near:   Left Eye Near:    Bilateral Near:     Physical Exam Vitals and nursing note reviewed.  Constitutional:      General: She is not in acute distress.    Appearance: Normal appearance. She is not ill-appearing.  HENT:     Head: Normocephalic and atraumatic.  Eyes:     Pupils: Pupils are equal, round, and reactive to light.  Cardiovascular:     Rate and Rhythm: Normal rate. Rhythm irregular.     Heart sounds: No murmur heard. Pulmonary:     Effort: Pulmonary effort is normal.     Breath sounds: Normal breath sounds.  Chest:     Chest wall: No tenderness.  Skin:    General: Skin is warm and dry.  Neurological:     General: No focal deficit present.     Mental  Status: She is alert and oriented to person, place, and time.  Psychiatric:        Mood and Affect: Mood normal.        Behavior: Behavior normal.      UC Treatments / Results  Labs (all labs ordered are listed, but only abnormal results are displayed) Labs Reviewed - No data to display  EKG   Radiology No results found.  Procedures ED EKG  Date/Time: 11/14/2023 4:00 PM  Performed by: Loreda Myla SAUNDERS, NP Authorized by: Loreda Myla SAUNDERS, NP   ECG interpreted by ED Physician in the absence of a cardiologist: no   Previous ECG:    Previous ECG:  Unavailable Interpretation:    Interpretation: abnormal   Rate:    ECG rate:  73   ECG rate assessment: normal   Rhythm:    Rhythm: sinus rhythm   Ectopy:    Ectopy: trigeminy   QRS:    QRS axis:  Normal   QRS conduction: normal   ST segments:    ST segments:  Normal T waves:    T waves: normal    (including critical care time)  Medications Ordered in UC Medications - No data to display  Initial Impression / Assessment and Plan / UC Course  I have reviewed the triage vital signs and the nursing notes.  Pertinent labs & imaging results that were available during my care of the patient were reviewed by me  and considered in my medical decision making (see chart for details).     Reviewed exam and symptoms with patient.  EKG shows trigeminy.  Patient with several weeks of chest tightness that has been more persistent over the past 2 days.  Given her abnormal EKG and other risk factors I advise she go to the emergency room for further workup and treatment of the symptoms.  She is in agreement with plan will go POV with her husband to the emergency room.  She denies any chest pain at time of evaluation.  She was instructed to pull over and call 911 for any worsening symptoms that occur in transit and she verbalized understanding. Final Clinical Impressions(s) / UC Diagnoses   Final diagnoses:  Abnormal EKG  Trigeminy  Chest  tightness     Discharge Instructions      Please go to the emergency room for further evaluation of your chest tightness and abnormal EKG    ED Prescriptions   None    PDMP not reviewed this encounter.   Loreda Myla SAUNDERS, NP 11/14/23 1602    Loreda Myla SAUNDERS, NP 11/15/23 1045

## 2023-11-14 NOTE — ED Triage Notes (Signed)
 Pt c/o int. tightness across entire chest whenever she inhales deeply and feels like something I never felt before across entire chest even with little breathsx2-3wks. Pt states this sensation in her chest has been making her feel fatigued and SOB.

## 2023-11-15 ENCOUNTER — Encounter: Payer: Self-pay | Admitting: Family Medicine

## 2023-11-15 DIAGNOSIS — I493 Ventricular premature depolarization: Secondary | ICD-10-CM

## 2023-11-16 ENCOUNTER — Ambulatory Visit: Attending: Neurology | Admitting: Speech Pathology

## 2023-11-16 ENCOUNTER — Encounter: Payer: Self-pay | Admitting: Speech Pathology

## 2023-11-16 DIAGNOSIS — R4701 Aphasia: Secondary | ICD-10-CM | POA: Diagnosis not present

## 2023-11-16 DIAGNOSIS — R41841 Cognitive communication deficit: Secondary | ICD-10-CM | POA: Diagnosis not present

## 2023-11-16 NOTE — Therapy (Signed)
 OUTPATIENT SPEECH LANGUAGE PATHOLOGY TREATMENT & RECERTIFICATION   Patient Name: Stephanie Ramsey MRN: [id number] DOB:05-23-1950, 73 y.o., female Today's Date: 11/16/2023  PCP: Alvia Bring, DO REFERRING PROVIDER: Dohmeier, Dedra, MD  END OF SESSION:  End of Session - 11/16/23 0852     Visit Number 5    Number of Visits 9    Date for Recertification  12/06/23    SLP Start Time 0845    SLP Stop Time  0925    SLP Time Calculation (min) 40 min    Activity Tolerance Patient tolerated treatment well          Past Medical History:  Diagnosis Date   Aphasia    Arthritis    Cancer (HCC)    right breast   Carpal tunnel syndrome    Chronic back pain    Complication of anesthesia    stopped breathing during procedure and dizziness after procedures   Depression    Hypercholesterolemia    Hypothyroidism    Leg cramps    Osteopenia    Pneumonia    Ulcerative colitis (HCC)    Past Surgical History:  Procedure Laterality Date   BILATERAL CARPAL TUNNEL RELEASE     BREAST BIOPSY Right 2011   In Situ DCIS   BREAST LUMPECTOMY Right 2011   in situ    BREAST SURGERY     biopsy and lumpectomy right breast   CHOLECYSTECTOMY     COLONOSCOPY W/ BIOPSIES AND POLYPECTOMY     JOINT REPLACEMENT     Total right hip   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   Procedure: LEFT TOTAL HIP ARTHROPLASTY;  Surgeon: Maude LELON Right, MD;  Location: [facility] OR;  Service: Orthopedics;  Laterality: Left;   TRIGGER FINGER RELEASE     Patient Active Problem List   Diagnosis Date Noted   Right foot pain first MTP 10/06/2023   Tick bite 08/23/2023   Left temporal headache 05/13/2023   DDD (degenerative disc disease), cervical 02/27/2023   Need for hepatitis C screening test 01/06/2023   Raynaud's disease without gangrene 09/28/2022   Pain of left heel 09/28/2022   Fall 05/04/2022   Neck pain 05/04/2022   Rib pain on right side 12/24/2021   Chronic intermittent hypoxia with obstructive sleep  apnea 08/24/2021   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   Non-restorative sleep 06/29/2021   Snoring 06/29/2021   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   Morning headache 06/16/2021   Essential tremor 06/16/2021   Rib pain on left side 01/12/2021   Left wrist pain 01/12/2021   Pleuritic chest pain 12/16/2020   Tenosynovitis of right wrist 12/03/2020   Abnormal prominence of clavicle 10/20/2020   Lumbar degenerative disc disease 12/10/2019   Benign paroxysmal positional vertigo of right ear 08/16/2019   History of adenomatous polyp of colon 01/26/2018   Trigger ring finger of right hand 11/16/2017   Leg cramps 06/02/2016   Encounter for Medicare annual wellness exam 08/11/2015   Hyperlipidemia 07/26/2015   Hypothyroidism 07/26/2015   Ulcerative colitis (HCC) 07/26/2015   Subcutaneous nodules 07/26/2015   Pain in joints 07/26/2015   Osteopenia 07/26/2015   Neutropenia 07/26/2015   Neck muscle spasm 07/26/2015   Malignant neoplasm of breast (HCC) 07/26/2015   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   IFG (impaired fasting glucose) 07/26/2015   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   Primary osteoarthritis of left hip 01/03/2014  S/P revision of total hip 01/01/2014    ONSET DATE: Referred on 09/20/23   REFERRING DIAG:  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 *To recert and place on hold for 1 month as pt will be out of town.  Pt accompanied by: self  PERTINENT HISTORY: Per EMR: Hip Arthritis and replacement in 2011 and second in 2015, revision in 2018. Breast Cancer, right breast, level  in-situ- 2011 [facility]), Carpal tunnel syndrome, Depression, Hypercholesterolemia, Hypothyroidism, Osteopenia, Covid 19 twice( 02-2019/ infusion of viral medication, and again 11-2020, CSA on CPAP.Pneumonia, and Ulcerative colitis since age 41 [facility]).   PAIN:  Are you having pain? No  FALLS: Has patient fallen in last 6 months?  No  LIVING ENVIRONMENT: Lives with: lives with their spouse; [place] Lives in: House/apartment  PLOF:  Level of assistance: Independent with ADLs, Independent with IADLs Employment: Retired; Runner, broadcasting/film/video   PATIENT GOALS: word finding   OBJECTIVE:  Note: Objective measures were completed at Evaluation unless otherwise noted.  DIAGNOSTIC FINDINGS: Per EMR:  IMPRESSION: 1. No acute intracranial abnormality. 2. But suspicion of disproportionate volume loss in the mesial temporal lobes, left greater than right, raising the possibility of Neurodegenerative disease. 3. Mild to moderate for age cerebral white matter signal changes, nonspecific but most commonly due to small chronic small vessel disease.     Electronically Signed   By: VEAR Hurst M.D.   On: 06/10/2023 11:08  COGNITION: Overall cognitive status: Impaired Areas of impairment:  Attention: Impaired: Selective, Alternating, Divided Memory: Impaired: Working Teacher, music term Archivist function: Impaired: Organization and Planning Functional deficits: Recall of basic information; husband was not here to provide additional insight.  COGNITIVE COMMUNICATION: Following directions: Follows multi-step commands inconsistently and with increased time Auditory comprehension: Impaired: Requires repetition; to be further evaluated  Verbal expression: Impaired: Word finding/word retrieval Functional communication: Impaired: Impacting conversations with family  ORAL MOTOR EXAMINATION: Overall status: Did not assess Comments: NA  STANDARDIZED ASSESSMENTS:   Cognitive Linguistic Quick Test: AGE -  18 - 69   The Cognitive Linguistic Quick Test (CLQT) was administered to assess the relative status of five cognitive domains: attention, memory, language, executive functioning, and visuospatial skills. Scores from 10 tasks were used to estimate severity ratings (standardized for age groups 18-69 years and 70-89 years) for each domain, a clock drawing task, as well as an overall composite severity rating of cognition.       Task Score Criterion Cut Scores  Personal Facts 8/8 8  Symbol Cancellation 6/12 11  Confrontation Naming 7/10 10  Clock Drawing  9/13 12  Story Retelling 7/10 6  Symbol Trails 8/10 9  Generative Naming 4/9 5  Design Memory 5/6 5  Mazes  8/8 7  Design Generation 7/13 6      PATIENT REPORTED OUTCOME MEASURES (PROM): Neuro - Cognitive:  121/40; husband scored patient lower in several areas.  TREATMENT DATE:   11/16/23: Pt was seen for skilled ST services targeting word finding. SLP observed occasional paraphasias (I.e. 94 for 74; state vs city). She has no instances where she has been completely stuck, but instances where it takes her longer to get there. SLP demonstrated use of TalkPath and assisted pt setting up an account for continued practice over the course of her trip. SLP to recert for two months (01/16/24) for continued work towards strategy implementation.   11/09/23: Pt was seen for skilled ST services targeting word finding. She felt communication went well over her trip and husband reports improvement. She has been using describe and first letter most successfully. SLP facilitated word finding using Semantic Feature Analysis (SFA) and Expanding Expression Tool. Pt required minA-modA to identify features. Completed session with pt describing basic objects. She required overall minA-modA verbal cues and occasional prompting questions.    10/26/23: Pt was seen for skilled ST services targeting word finding. Pt's husband was unable to join today due to not feeling well. To discuss communication strategies at next visit. Pt reports people will try to fill in the word for her which can make her frustrated because she wants to try to do it independently first. SLP educated on word finding strategies this session re: delay, describe, association, synonym, first letter, gesture, look it up, draw, narrow it down, and come back later. Pt feels like these will be super helpful for her moving forward. SLP provided brief education on Semantic Feature Analysis (SFA) to continue with describe  next session if time allows.   10/19/23: Pt was seen for skilled ST services targeting continued assessment and completion of PROM measures. Pt and husband report re We've gotten used to the missing words/missing phrases.  Pt reported that if she waits long enough, eventually she will be able to retrieve the word she is looking for. Husband shared that in the last 3 months, pt has been losing some long term memory - not recalling a recipe that she had had multiple times, as well as, not recalling ingredients used in recipe. Husband does report frustration with communication at home and reports this impacts them both. SLP to follow up with communication strategies next session.  Continued assessment for language re: 2-step: 100% Narrative Discourse (Simple) Yes/no questions: 5/8     PATIENT EDUCATION: Education details: SLP role in Cognitive-Communication Person educated: Patient Education method: Explanation Education comprehension: verbalized understanding and needs further education   GOALS: Goals reviewed with patient? No  SHORT TERM GOALS: Target date: 10/17/23  Complete CLQT/language measures, PROM, and update goals Baseline: Goal status: MET 2. Pt/husband will recall use of communication strategies to support aphasia  Baseline:  Goal  status: NOT MET 3. Pt will recall 2+ word finding strategies  Baseline:  Goal status:NOT MET   LONG TERM GOALS: Target date: 11/18/23; 01/16/24  Improve score on PROM Baseline:  Goal status: NOT MET; CONT  2.  Pt/husband will report successful use of communication strategies to support aphasia Baseline:  Goal status: NOT MET; CONT  3.  Pt/husband will report successful use of word finding strategies in conversations Baseline:  Goal status: NOT MET; CONT    ASSESSMENT:  CLINICAL IMPRESSION: Pt is a 73 yo female who presents to ST OP for evaluation with complaints of cognitive changes. SEE TX NOTE. TO RECERT FOR ADDITIONAL VISITS DUE TO PT LEAVING TOWN FOR THE MONTH OF OCTOBER.  SLP rec skilled ST services to address cognitive-linguistic impairment to maximize functional  communication.    OBJECTIVE IMPAIRMENTS: include attention, memory, expressive language, and receptive language. These impairments are limiting patient from managing medications, managing appointments, managing finances, household responsibilities, ADLs/IADLs, and effectively communicating at home and in community. Factors affecting potential to achieve goals and functional outcome are NA.SABRA Patient will benefit from skilled SLP services to address above impairments and improve overall function.  REHAB POTENTIAL: Good  PLAN:  SLP FREQUENCY: 1x/week  SLP DURATION: 4 weeks (1 MONTH HOLD)  PLANNED INTERVENTIONS: Language facilitation, Environmental controls, Cueing hierachy, Cognitive reorganization, Internal/external aids, Functional tasks, Multimodal communication approach, SLP instruction and feedback, Compensatory strategies, Patient/family education, and 07492 Treatment of speech (30 or 45 min)     [employer], CCC-SLP 11/16/2023, 8:53 AM

## 2023-11-16 NOTE — Telephone Encounter (Signed)
 Pended referral

## 2023-12-21 ENCOUNTER — Encounter: Payer: Self-pay | Admitting: Speech Pathology

## 2023-12-21 ENCOUNTER — Ambulatory Visit: Payer: Self-pay | Attending: Neurology | Admitting: Speech Pathology

## 2023-12-21 DIAGNOSIS — R4701 Aphasia: Secondary | ICD-10-CM | POA: Diagnosis not present

## 2023-12-21 DIAGNOSIS — R41841 Cognitive communication deficit: Secondary | ICD-10-CM | POA: Diagnosis not present

## 2023-12-21 NOTE — Therapy (Signed)
 OUTPATIENT SPEECH LANGUAGE PATHOLOGY TREATMENT    Patient Name: Stephanie Ramsey MRN: [id number] DOB:01/26/1951, 73 y.o., female Today's Date: 12/21/2023  PCP: Alvia Bring, DO REFERRING PROVIDER: Dohmeier, Dedra, MD  END OF SESSION:  End of Session - 12/21/23 0851     Visit Number 6    Number of Visits 9    Date for Recertification  01/16/24    SLP Start Time 0845    SLP Stop Time  0925    SLP Time Calculation (min) 40 min    Activity Tolerance Patient tolerated treatment well          Past Medical History:  Diagnosis Date   Aphasia    Arthritis    Cancer (HCC)    right breast   Carpal tunnel syndrome    Chronic back pain    Complication of anesthesia    stopped breathing during procedure and dizziness after procedures   Depression    Hypercholesterolemia    Hypothyroidism    Leg cramps    Osteopenia    Pneumonia    Ulcerative colitis (HCC)    Past Surgical History:  Procedure Laterality Date   BILATERAL CARPAL TUNNEL RELEASE     BREAST BIOPSY Right 2011   In Situ DCIS   BREAST LUMPECTOMY Right 2011   in situ    BREAST SURGERY     biopsy and lumpectomy right breast   CHOLECYSTECTOMY     COLONOSCOPY W/ BIOPSIES AND POLYPECTOMY     JOINT REPLACEMENT     Total right hip   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   Procedure: LEFT TOTAL HIP ARTHROPLASTY;  Surgeon: Maude LELON Right, MD;  Location: [facility] OR;  Service: Orthopedics;  Laterality: Left;   TRIGGER FINGER RELEASE     Patient Active Problem List   Diagnosis Date Noted   Right foot pain first MTP 10/06/2023   Tick bite 08/23/2023   Left temporal headache 05/13/2023   DDD (degenerative disc disease), cervical 02/27/2023   Need for hepatitis C screening test 01/06/2023   Raynaud's disease without gangrene 09/28/2022   Pain of left heel 09/28/2022   Fall 05/04/2022   Neck pain 05/04/2022   Rib pain on right side 12/24/2021   Chronic intermittent hypoxia with obstructive sleep apnea 08/24/2021    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   Non-restorative sleep 06/29/2021   Snoring 06/29/2021   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   Morning headache 06/16/2021   Essential tremor 06/16/2021   Rib pain on left side 01/12/2021   Left wrist pain 01/12/2021   Pleuritic chest pain 12/16/2020   Tenosynovitis of right wrist 12/03/2020   Abnormal prominence of clavicle 10/20/2020   Lumbar degenerative disc disease 12/10/2019   Benign paroxysmal positional vertigo of right ear 08/16/2019   History of adenomatous polyp of colon 01/26/2018   Trigger ring finger of right hand 11/16/2017   Leg cramps 06/02/2016   Encounter for Medicare annual wellness exam 08/11/2015   Hyperlipidemia 07/26/2015   Hypothyroidism 07/26/2015   Ulcerative colitis (HCC) 07/26/2015   Subcutaneous nodules 07/26/2015   Pain in joints 07/26/2015   Osteopenia 07/26/2015   Neutropenia 07/26/2015   Neck muscle spasm 07/26/2015   Malignant neoplasm of breast (HCC) 07/26/2015   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   IFG (impaired fasting glucose) 07/26/2015   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   Primary osteoarthritis of left hip 01/03/2014  S/P revision of total hip 01/01/2014    ONSET DATE: Referred on 09/20/23   REFERRING DIAG:  R41.841 (ICD-10-CM) - Cognitive communication disorder  Z87.898 (ICD-10-CM) - History of short term memory loss    THERAPY DIAG:  Aphasia  Rationale for Evaluation and Treatment: Rehabilitation  SUBJECTIVE:   SUBJECTIVE STATEMENT: Pt reports she had a good time.   Pt accompanied by: self  PERTINENT HISTORY: Per EMR: Hip Arthritis and replacement in 2011 and second in 2015, revision in 2018. Breast Cancer, right breast, level in-situ- 2011 [facility]), Carpal tunnel syndrome, Depression, Hypercholesterolemia, Hypothyroidism, Osteopenia, Covid 19 twice( 02-2019/  infusion of viral medication, and again 11-2020, CSA on CPAP.Pneumonia, and Ulcerative colitis since age 56 [facility]).   PAIN:  Are you having pain? No  FALLS: Has patient fallen in last 6 months?  No  LIVING ENVIRONMENT: Lives with: lives with their spouse; [place] Lives in: House/apartment  PLOF:  Level of assistance: Independent with ADLs, Independent with IADLs Employment: Retired; runner, broadcasting/film/video   PATIENT GOALS: word finding   OBJECTIVE:  Note: Objective measures were completed at Evaluation unless otherwise noted.  DIAGNOSTIC FINDINGS: Per EMR:  IMPRESSION: 1. No acute intracranial abnormality. 2. But suspicion of disproportionate volume loss in the mesial temporal lobes, left greater than right, raising the possibility of Neurodegenerative disease. 3. Mild to moderate for age cerebral white matter signal changes, nonspecific but most commonly due to small chronic small vessel disease.     Electronically Signed   By: VEAR Hurst M.D.   On: 06/10/2023 11:08  COGNITION: Overall cognitive status: Impaired Areas of impairment:  Attention: Impaired: Selective, Alternating, Divided Memory: Impaired: Working Teacher, Music term Archivist function: Impaired: Organization and Planning Functional deficits: Recall of basic information; husband was not here to provide additional insight.  COGNITIVE COMMUNICATION: Following directions: Follows multi-step commands inconsistently and with increased time Auditory comprehension: Impaired: Requires repetition; to be further evaluated  Verbal expression: Impaired: Word finding/word retrieval Functional communication: Impaired: Impacting conversations with family  ORAL MOTOR EXAMINATION: Overall status: Did not assess Comments: NA  STANDARDIZED ASSESSMENTS:   Cognitive Linguistic Quick Test: AGE - 18 - 69   The Cognitive Linguistic Quick Test (CLQT) was administered to assess the relative status of five cognitive domains:  attention, memory, language, executive functioning, and visuospatial skills. Scores from 10 tasks were used to estimate severity ratings (standardized for age groups 18-69 years and 70-89 years) for each domain, a clock drawing task, as well as an overall composite severity rating of cognition.       Task Score Criterion Cut Scores  Personal Facts 8/8 8  Symbol Cancellation 6/12 11  Confrontation Naming 7/10 10  Clock Drawing  9/13 12  Story Retelling 7/10 6  Symbol Trails 8/10 9  Generative Naming 4/9 5  Design Memory 5/6 5  Mazes  8/8 7  Design Generation 7/13 6      PATIENT REPORTED OUTCOME MEASURES (PROM): Neuro - Cognitive:  121/40; husband scored patient lower in several areas.  TREATMENT DATE:   12/21/23: Pt was seen for skilled ST services targeting word finding. She has been on hold for a month due to travel. Pt reports communication was frustrating at first because she wasn't used to having that many people around and talking constantly. She reports she has another sister who is having word finding trouble and felt more comfortable communicating with her. Reports frustration when communicating with husband because he fills in automatically vs waiting to process. Pt reports she was using re: delay and picturing word in her head and then trying to produce it. She reports she did not use many strategies purposefully. Provided pt with communication strategies to discuss with family. Briefly reviewed describe strategy. To discuss more next session.   11/16/23: Pt was seen for skilled ST services targeting word finding. SLP observed occasional paraphasias (I.e. 94 for 74; state vs city). She has no instances where she has been completely stuck, but instances where it takes her longer to get there. SLP demonstrated use of TalkPath and assisted pt setting up an  account for continued practice over the course of her trip. SLP to recert for two months (01/16/24) for continued work towards strategy implementation.   11/09/23: Pt was seen for skilled ST services targeting word finding. She felt communication went well over her trip and husband reports improvement. She has been using describe and first letter most successfully. SLP facilitated word finding using Semantic Feature Analysis (SFA) and Expanding Expression Tool. Pt required minA-modA to identify features. Completed session with pt describing basic objects. She required overall minA-modA verbal cues and occasional prompting questions.   10/26/23: Pt was seen for skilled ST services targeting word finding. Pt's husband was unable to join today due to not feeling well. To discuss communication strategies at next visit. Pt reports people will try to fill in the word for her which can make her frustrated because she wants to try to do it independently first. SLP educated on word finding strategies this session re: delay, describe, association, synonym, first letter, gesture, look it up, draw, narrow it down, and come back later. Pt feels like these will be super helpful for her moving forward. SLP provided brief education on Semantic Feature Analysis (SFA) to continue with describe  next session if time allows.   10/19/23: Pt was seen for skilled ST services targeting continued assessment and completion of PROM measures. Pt and husband report re We've gotten used to the missing words/missing phrases.  Pt reported that if she waits long enough, eventually she will be able to retrieve the word she is looking for. Husband shared that in the last 3 months, pt has been losing some long term memory - not recalling a recipe that she had had multiple times, as well as, not recalling ingredients used in recipe. Husband does report frustration with communication at home and reports this impacts them both. SLP to follow up with  communication strategies next session.  Continued assessment for language re: 2-step: 100% Narrative Discourse (Simple) Yes/no questions: 5/8     PATIENT EDUCATION: Education details: SLP role in Cognitive-Communication Person educated: Patient Education method: Explanation Education comprehension: verbalized understanding and needs further education   GOALS: Goals reviewed with patient? No  SHORT TERM GOALS: Target date: 10/17/23  Complete CLQT/language measures, PROM, and update goals Baseline: Goal status: MET 2. Pt/husband will recall use of communication strategies to support aphasia  Baseline:  Goal status: NOT MET 3. Pt will recall 2+ word finding strategies  Baseline:  Goal status:NOT MET   LONG TERM GOALS: Target date: 11/18/23; 01/16/24  Improve score on PROM Baseline:  Goal status: NOT MET; CONT  2.  Pt/husband will report successful use of communication strategies to support aphasia Baseline:  Goal status: NOT MET; CONT  3.  Pt/husband will report successful use of word finding strategies in conversations Baseline:  Goal status: NOT MET; CONT    ASSESSMENT:  CLINICAL IMPRESSION: Pt is a 73 yo female who presents to ST OP for evaluation with complaints of cognitive changes. SEE TX NOTE. TO RECERT FOR ADDITIONAL VISITS DUE TO PT LEAVING TOWN FOR THE MONTH OF OCTOBER.  SLP rec skilled ST services to address cognitive-linguistic impairment to maximize functional communication.    OBJECTIVE IMPAIRMENTS: include attention, memory, expressive language, and receptive language. These impairments are limiting patient from managing medications, managing appointments, managing finances, household responsibilities, ADLs/IADLs, and effectively communicating at home and in community. Factors affecting potential to achieve goals and functional outcome are NA.SABRA Patient will benefit from skilled SLP services to address above impairments and improve overall  function.  REHAB POTENTIAL: Good  PLAN:  SLP FREQUENCY: 1x/week  SLP DURATION: 4 weeks (1 MONTH HOLD)  PLANNED INTERVENTIONS: Language facilitation, Environmental controls, Cueing hierachy, Cognitive reorganization, Internal/external aids, Functional tasks, Multimodal communication approach, SLP instruction and feedback, Compensatory strategies, Patient/family education, and 07492 Treatment of speech (30 or 45 min)     [employer], CCC-SLP 12/21/2023, 8:52 AM

## 2023-12-22 DIAGNOSIS — H43821 Vitreomacular adhesion, right eye: Secondary | ICD-10-CM | POA: Diagnosis not present

## 2023-12-22 DIAGNOSIS — H2513 Age-related nuclear cataract, bilateral: Secondary | ICD-10-CM | POA: Diagnosis not present

## 2023-12-22 DIAGNOSIS — H35341 Macular cyst, hole, or pseudohole, right eye: Secondary | ICD-10-CM | POA: Diagnosis not present

## 2023-12-22 DIAGNOSIS — H353121 Nonexudative age-related macular degeneration, left eye, early dry stage: Secondary | ICD-10-CM | POA: Diagnosis not present

## 2023-12-28 ENCOUNTER — Ambulatory Visit: Payer: Self-pay | Admitting: Speech Pathology

## 2023-12-28 ENCOUNTER — Encounter: Payer: Self-pay | Admitting: Speech Pathology

## 2023-12-28 DIAGNOSIS — R4701 Aphasia: Secondary | ICD-10-CM | POA: Diagnosis not present

## 2023-12-28 DIAGNOSIS — R41841 Cognitive communication deficit: Secondary | ICD-10-CM

## 2023-12-28 NOTE — Therapy (Signed)
 OUTPATIENT SPEECH LANGUAGE PATHOLOGY TREATMENT    Patient Name: Stephanie Ramsey MRN: [id number] DOB:[birth year], 73 y.o., female Today's Date: 12/28/2023  PCP: Alvia Bring, DO REFERRING PROVIDER: Dohmeier, Dedra, MD  END OF SESSION:  End of Session - 12/28/23 0848     Visit Number 7    Number of Visits 9    Date for Recertification  01/16/24    SLP Start Time 0845    SLP Stop Time  0925    SLP Time Calculation (min) 40 min    Activity Tolerance Patient tolerated treatment well          Past Medical History:  Diagnosis Date   Aphasia    Arthritis    Cancer (HCC)    right breast   Carpal tunnel syndrome    Chronic back pain    Complication of anesthesia    stopped breathing during procedure and dizziness after procedures   Depression    Hypercholesterolemia    Hypothyroidism    Leg cramps    Osteopenia    Pneumonia    Ulcerative colitis (HCC)    Past Surgical History:  Procedure Laterality Date   BILATERAL CARPAL TUNNEL RELEASE     BREAST BIOPSY Right 2011   In Situ DCIS   BREAST LUMPECTOMY Right 2011   in situ    BREAST SURGERY     biopsy and lumpectomy right breast   CHOLECYSTECTOMY     COLONOSCOPY W/ BIOPSIES AND POLYPECTOMY     JOINT REPLACEMENT     Total right hip   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   Procedure: LEFT TOTAL HIP ARTHROPLASTY;  Surgeon: Maude LELON Right, MD;  Location: [facility] OR;  Service: Orthopedics;  Laterality: Left;   TRIGGER FINGER RELEASE     Patient Active Problem List   Diagnosis Date Noted   Right foot pain first MTP 10/06/2023   Tick bite 08/23/2023   Left temporal headache 05/13/2023   DDD (degenerative disc disease), cervical 02/27/2023   Need for hepatitis C screening test 01/06/2023   Raynaud's disease without gangrene 09/28/2022   Pain of left heel 09/28/2022   Fall 05/04/2022   Neck pain 05/04/2022   Rib pain on right side 12/24/2021   Chronic intermittent hypoxia with obstructive sleep apnea 08/24/2021    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   Non-restorative sleep 06/29/2021   Snoring 06/29/2021   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   Morning headache 06/16/2021   Essential tremor 06/16/2021   Rib pain on left side 01/12/2021   Left wrist pain 01/12/2021   Pleuritic chest pain 12/16/2020   Tenosynovitis of right wrist 12/03/2020   Abnormal prominence of clavicle 10/20/2020   Lumbar degenerative disc disease 12/10/2019   Benign paroxysmal positional vertigo of right ear 08/16/2019   History of adenomatous polyp of colon 01/26/2018   Trigger ring finger of right hand 11/16/2017   Leg cramps 06/02/2016   Encounter for Medicare annual wellness exam 08/11/2015   Hyperlipidemia 07/26/2015   Hypothyroidism 07/26/2015   Ulcerative colitis (HCC) 07/26/2015   Subcutaneous nodules 07/26/2015   Pain in joints 07/26/2015   Osteopenia 07/26/2015   Neutropenia 07/26/2015   Neck muscle spasm 07/26/2015   Malignant neoplasm of breast (HCC) 07/26/2015   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   IFG (impaired fasting glucose) 07/26/2015   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   Primary osteoarthritis of left hip 01/03/2014  S/P revision of total hip 01/01/2014    ONSET DATE: Referred on 09/20/23   REFERRING DIAG:  R41.841 (ICD-10-CM) - Cognitive communication disorder  Z87.898 (ICD-10-CM) - History of short term memory loss    THERAPY DIAG:  Aphasia  Cognitive communication deficit  Rationale for Evaluation and Treatment: Rehabilitation  SUBJECTIVE:   SUBJECTIVE STATEMENT: Pt reports her husband went through out communication handouts from last time.  Pt accompanied by: self  PERTINENT HISTORY: Per EMR: Hip Arthritis and replacement in 2011 and second in 2015, revision in 2018. Breast Cancer, right breast, level in-situ- 2011 [facility]), Carpal tunnel syndrome,  Depression, Hypercholesterolemia, Hypothyroidism, Osteopenia, Covid 19 twice( 02-2019/ infusion of viral medication, and again 11-2020, CSA on CPAP.Pneumonia, and Ulcerative colitis since age 29 [facility]).   PAIN:  Are you having pain? No  FALLS: Has patient fallen in last 6 months?  No  LIVING ENVIRONMENT: Lives with: lives with their spouse; [place] Lives in: House/apartment  PLOF:  Level of assistance: Independent with ADLs, Independent with IADLs Employment: Retired; runner, broadcasting/film/video   PATIENT GOALS: word finding   OBJECTIVE:  Note: Objective measures were completed at Evaluation unless otherwise noted.  DIAGNOSTIC FINDINGS: Per EMR:  IMPRESSION: 1. No acute intracranial abnormality. 2. But suspicion of disproportionate volume loss in the mesial temporal lobes, left greater than right, raising the possibility of Neurodegenerative disease. 3. Mild to moderate for age cerebral white matter signal changes, nonspecific but most commonly due to small chronic small vessel disease.     Electronically Signed   By: VEAR Hurst M.D.   On: 06/10/2023 11:08  COGNITION: Overall cognitive status: Impaired Areas of impairment:  Attention: Impaired: Selective, Alternating, Divided Memory: Impaired: Working Teacher, Music term Archivist function: Impaired: Organization and Planning Functional deficits: Recall of basic information; husband was not here to provide additional insight.  COGNITIVE COMMUNICATION: Following directions: Follows multi-step commands inconsistently and with increased time Auditory comprehension: Impaired: Requires repetition; to be further evaluated  Verbal expression: Impaired: Word finding/word retrieval Functional communication: Impaired: Impacting conversations with family  ORAL MOTOR EXAMINATION: Overall status: Did not assess Comments: NA  STANDARDIZED ASSESSMENTS:   Cognitive Linguistic Quick Test: AGE - 18 - 69   The Cognitive Linguistic Quick  Test (CLQT) was administered to assess the relative status of five cognitive domains: attention, memory, language, executive functioning, and visuospatial skills. Scores from 10 tasks were used to estimate severity ratings (standardized for age groups 18-69 years and 70-89 years) for each domain, a clock drawing task, as well as an overall composite severity rating of cognition.       Task Score Criterion Cut Scores  Personal Facts 8/8 8  Symbol Cancellation 6/12 11  Confrontation Naming 7/10 10  Clock Drawing  9/13 12  Story Retelling 7/10 6  Symbol Trails 8/10 9  Generative Naming 4/9 5  Design Memory 5/6 5  Mazes  8/8 7  Design Generation 7/13 6      PATIENT REPORTED OUTCOME MEASURES (PROM): Neuro - Cognitive:  121/40; husband scored patient lower in several areas.  TREATMENT DATE:   12/28/23: Pt was seen for skilled ST services targeting word finding. She shared handout with her husband and she felt like she has seen changes re:  him giving her extra time and clarifying. Pt reports that she is using word finding strategies re: describe and look it up consistently. She reports she tends to struggle to retrieve the same words every time and she is going to start writing those down for reference. SLP continued with SFA. Overall, pt required modA for word retrieval to describe objects. Pt does not respond well with articulatory cues.    12/21/23: Pt was seen for skilled ST services targeting word finding. She has been on hold for a month due to travel. Pt reports communication was frustrating at first because she wasn't used to having that many people around and talking constantly. She reports she has another sister who is having word finding trouble and felt more comfortable communicating with her. Reports frustration when communicating with husband because he fills in  automatically vs waiting to process. Pt reports she was using re: delay and picturing word in her head and then trying to produce it. She reports she did not use many strategies purposefully. Provided pt with communication strategies to discuss with family. Briefly reviewed describe strategy. To discuss more next session.   11/16/23: Pt was seen for skilled ST services targeting word finding. SLP observed occasional paraphasias (I.e. 94 for 73; state vs city). She has no instances where she has been completely stuck, but instances where it takes her longer to get there. SLP demonstrated use of TalkPath and assisted pt setting up an account for continued practice over the course of her trip. SLP to recert for two months (01/16/24) for continued work towards strategy implementation.   11/09/23: Pt was seen for skilled ST services targeting word finding. She felt communication went well over her trip and husband reports improvement. She has been using describe and first letter most successfully. SLP facilitated word finding using Semantic Feature Analysis (SFA) and Expanding Expression Tool. Pt required minA-modA to identify features. Completed session with pt describing basic objects. She required overall minA-modA verbal cues and occasional prompting questions.   10/26/23: Pt was seen for skilled ST services targeting word finding. Pt's husband was unable to join today due to not feeling well. To discuss communication strategies at next visit. Pt reports people will try to fill in the word for her which can make her frustrated because she wants to try to do it independently first. SLP educated on word finding strategies this session re: delay, describe, association, synonym, first letter, gesture, look it up, draw, narrow it down, and come back later. Pt feels like these will be super helpful for her moving forward. SLP provided brief education on Semantic Feature Analysis (SFA) to continue with describe  next  session if time allows.   10/19/23: Pt was seen for skilled ST services targeting continued assessment and completion of PROM measures. Pt and husband report re We've gotten used to the missing words/missing phrases.  Pt reported that if she waits long enough, eventually she will be able to retrieve the word she is looking for. Husband shared that in the last 3 months, pt has been losing some long term memory - not recalling a recipe that she had had multiple times, as well as, not recalling ingredients used in recipe. Husband does report frustration with communication at home and reports this impacts them both. SLP to follow up  with communication strategies next session.  Continued assessment for language re: 2-step: 100% Narrative Discourse (Simple) Yes/no questions: 5/8     PATIENT EDUCATION: Education details: SLP role in Cognitive-Communication Person educated: Patient Education method: Explanation Education comprehension: verbalized understanding and needs further education   GOALS: Goals reviewed with patient? No  SHORT TERM GOALS: Target date: 10/17/23  Complete CLQT/language measures, PROM, and update goals Baseline: Goal status: MET 2. Pt/husband will recall use of communication strategies to support aphasia  Baseline:  Goal status: NOT MET 3. Pt will recall 2+ word finding strategies  Baseline:  Goal status:NOT MET   LONG TERM GOALS: Target date: 11/18/23; 01/16/24  Improve score on PROM Baseline:  Goal status: NOT MET; CONT  2.  Pt/husband will report successful use of communication strategies to support aphasia Baseline:  Goal status: NOT MET; CONT  3.  Pt/husband will report successful use of word finding strategies in conversations Baseline:  Goal status: NOT MET; CONT    ASSESSMENT:  CLINICAL IMPRESSION: Pt is a 73 yo female who presents to ST OP for evaluation with complaints of cognitive changes. SEE TX NOTE. TO RECERT FOR ADDITIONAL VISITS DUE TO PT  LEAVING TOWN FOR THE MONTH OF OCTOBER.  SLP rec skilled ST services to address cognitive-linguistic impairment to maximize functional communication.    OBJECTIVE IMPAIRMENTS: include attention, memory, expressive language, and receptive language. These impairments are limiting patient from managing medications, managing appointments, managing finances, household responsibilities, ADLs/IADLs, and effectively communicating at home and in community. Factors affecting potential to achieve goals and functional outcome are NA.SABRA Patient will benefit from skilled SLP services to address above impairments and improve overall function.  REHAB POTENTIAL: Good  PLAN:  SLP FREQUENCY: 1x/week  SLP DURATION: 4 weeks (1 MONTH HOLD)  PLANNED INTERVENTIONS: Language facilitation, Environmental controls, Cueing hierachy, Cognitive reorganization, Internal/external aids, Functional tasks, Multimodal communication approach, SLP instruction and feedback, Compensatory strategies, Patient/family education, and [id number] Treatment of speech (30 or 45 min)     [employer], CCC-SLP 12/28/2023, 8:49 AM

## 2024-01-04 ENCOUNTER — Ambulatory Visit: Payer: Self-pay | Admitting: Speech Pathology

## 2024-01-04 ENCOUNTER — Encounter: Payer: Self-pay | Admitting: Speech Pathology

## 2024-01-04 DIAGNOSIS — R4701 Aphasia: Secondary | ICD-10-CM | POA: Diagnosis not present

## 2024-01-04 DIAGNOSIS — R41841 Cognitive communication deficit: Secondary | ICD-10-CM | POA: Diagnosis not present

## 2024-01-04 NOTE — Therapy (Signed)
 OUTPATIENT SPEECH LANGUAGE PATHOLOGY TREATMENT & DISCHARGE SUMMARY   Patient Name: Stephanie Ramsey MRN: [id number] DOB:03-Jan-1951, 73 y.o., female Today's Date: 01/04/2024  PCP: Alvia Bring, DO REFERRING PROVIDER: Dohmeier, Dedra, MD  SPEECH THERAPY DISCHARGE SUMMARY  Visits from Start of Care: 8  Current functional level related to goals / functional outcomes: Pt has met all goals.     Remaining deficits: Anomia   Education / Equipment: Completed   Patient agrees to discharge. Patient goals were partially met. Patient is being discharged due to being pleased with the current functional level. And meeting of goals.     END OF SESSION:  End of Session - 01/04/24 0850     Visit Number 8    Number of Visits 9    Date for Recertification  01/16/24    SLP Start Time 0845    SLP Stop Time  0925    SLP Time Calculation (min) 40 min    Activity Tolerance Patient tolerated treatment well          Past Medical History:  Diagnosis Date   Aphasia    Arthritis    Cancer (HCC)    right breast   Carpal tunnel syndrome    Chronic back pain    Complication of anesthesia    stopped breathing during procedure and dizziness after procedures   Depression    Hypercholesterolemia    Hypothyroidism    Leg cramps    Osteopenia    Pneumonia    Ulcerative colitis (HCC)    Past Surgical History:  Procedure Laterality Date   BILATERAL CARPAL TUNNEL RELEASE     BREAST BIOPSY Right 2011   In Situ DCIS   BREAST LUMPECTOMY Right 2011   in situ    BREAST SURGERY     biopsy and lumpectomy right breast   CHOLECYSTECTOMY     COLONOSCOPY W/ BIOPSIES AND POLYPECTOMY     JOINT REPLACEMENT     Total right hip   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   Procedure: LEFT TOTAL HIP ARTHROPLASTY;  Surgeon: Maude LELON Right, MD;  Location: [facility] OR;  Service: Orthopedics;  Laterality: Left;   TRIGGER FINGER RELEASE     Patient Active Problem List   Diagnosis Date Noted   Right foot  pain first MTP 10/06/2023   Tick bite 08/23/2023   Left temporal headache 05/13/2023   DDD (degenerative disc disease), cervical 02/27/2023   Need for hepatitis C screening test 01/06/2023   Raynaud's disease without gangrene 09/28/2022   Pain of left heel 09/28/2022   Fall 05/04/2022   Neck pain 05/04/2022   Rib pain on right side 12/24/2021   Chronic intermittent hypoxia with obstructive sleep apnea 08/24/2021   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   Non-restorative sleep 06/29/2021   Snoring 06/29/2021   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   Morning headache 06/16/2021   Essential tremor 06/16/2021   Rib pain on left side 01/12/2021   Left wrist pain 01/12/2021   Pleuritic chest pain 12/16/2020   Tenosynovitis of right wrist 12/03/2020   Abnormal prominence of clavicle 10/20/2020   Lumbar degenerative disc disease 12/10/2019   Benign paroxysmal positional vertigo of right ear 08/16/2019   History of adenomatous polyp of colon 01/26/2018   Trigger ring finger of right hand 11/16/2017   Leg cramps 06/02/2016   Encounter for Medicare annual wellness exam 08/11/2015   Hyperlipidemia 07/26/2015  Hypothyroidism 07/26/2015   Ulcerative colitis (HCC) 07/26/2015   Subcutaneous nodules 07/26/2015   Pain in joints 07/26/2015   Osteopenia 07/26/2015   Neutropenia 07/26/2015   Neck muscle spasm 07/26/2015   Malignant neoplasm of breast (HCC) 07/26/2015   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   IFG (impaired fasting glucose) 07/26/2015   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   Primary osteoarthritis of left hip 01/03/2014   S/P revision of total hip 01/01/2014    ONSET DATE: Referred on 09/20/23   REFERRING DIAG:  R41.841 (ICD-10-CM) - Cognitive communication disorder  Z87.898 (ICD-10-CM) - History of short term memory loss    THERAPY DIAG:   Aphasia  Rationale for Evaluation and Treatment: Rehabilitation  SUBJECTIVE:   SUBJECTIVE STATEMENT: Pretty good.   Pt accompanied by: self  PERTINENT HISTORY: Per EMR: Hip Arthritis and replacement in 2011 and second in 2015, revision in 2018. Breast Cancer, right breast, level in-situ- 2011 [facility]), Carpal tunnel syndrome, Depression, Hypercholesterolemia, Hypothyroidism, Osteopenia, Covid 19 twice( 02-2019/ infusion of viral medication, and again 11-2020, CSA on CPAP.Pneumonia, and Ulcerative colitis since age 37 [facility]).   PAIN:  Are you having pain? No  FALLS: Has patient fallen in last 6 months?  No  LIVING ENVIRONMENT: Lives with: lives with their spouse; [place] Lives in: House/apartment  PLOF:  Level of assistance: Independent with ADLs, Independent with IADLs Employment: Retired; runner, broadcasting/film/video   PATIENT GOALS: word finding   OBJECTIVE:  Note: Objective measures were completed at Evaluation unless otherwise noted.  DIAGNOSTIC FINDINGS: Per EMR:  IMPRESSION: 1. No acute intracranial abnormality. 2. But suspicion of disproportionate volume loss in the mesial temporal lobes, left greater than right, raising the possibility of Neurodegenerative disease. 3. Mild to moderate for age cerebral white matter signal changes, nonspecific but most commonly due to small chronic small vessel disease.     Electronically Signed   By: VEAR Hurst M.D.   On: 06/10/2023 11:08  COGNITION: Overall cognitive status: Impaired Areas of impairment:  Attention: Impaired: Selective, Alternating, Divided Memory: Impaired: Working Teacher, Music term Archivist function: Impaired: Organization and Planning Functional deficits: Recall of basic information; husband was not here to provide additional insight.  COGNITIVE COMMUNICATION: Following directions: Follows multi-step commands inconsistently and with increased time Auditory comprehension: Impaired: Requires repetition; to be  further evaluated  Verbal expression: Impaired: Word finding/word retrieval Functional communication: Impaired: Impacting conversations with family  ORAL MOTOR EXAMINATION: Overall status: Did not assess Comments: NA  STANDARDIZED ASSESSMENTS:   Cognitive Linguistic Quick Test: AGE - 18 - 69   The Cognitive Linguistic Quick Test (CLQT) was administered to assess the relative status of five cognitive domains: attention, memory, language, executive functioning, and visuospatial skills. Scores from 10 tasks were used to estimate severity ratings (standardized for age groups 18-69 years and 70-89 years) for each domain, a clock drawing task, as well as an overall composite severity rating of cognition.       Task Score Criterion Cut Scores  Personal Facts 8/8 8  Symbol Cancellation 6/12 11  Confrontation Naming 7/10 10  Clock Drawing  9/13 12  Story Retelling 7/10 6  Symbol Trails 8/10 9  Generative Naming 4/9 5  Design Memory 5/6 5  Mazes  8/8 7  Design Generation 7/13 6      PATIENT REPORTED OUTCOME MEASURES (PROM): @ eval Neuro - Cognitive:  121/140; husband scored patient lower in several areas.   @ discharge Neuro- Cognitive: 119/140  TREATMENT DATE:   01/04/24: Pt was seen for skilled ST services targeting word finding. She reports her husband has been patient with communication at home and is using more supported strategies. Pt reports she is having limited word finding issues at home and feels ready for discharge. SLP encouraged to return if she has any cognitive changes or concerns, or word finding changes. Pt completed PROM - see above.   12/28/23: Pt was seen for skilled ST services targeting word finding. She shared handout with her husband and she felt like she has seen changes re:  him giving her extra time and clarifying. Pt reports that she is  using word finding strategies re: describe and look it up consistently. She reports she tends to struggle to retrieve the same words every time and she is going to start writing those down for reference. SLP continued with SFA. Overall, pt required modA for word retrieval to describe objects. Pt does not respond well with articulatory cues.    12/21/23: Pt was seen for skilled ST services targeting word finding. She has been on hold for a month due to travel. Pt reports communication was frustrating at first because she wasn't used to having that many people around and talking constantly. She reports she has another sister who is having word finding trouble and felt more comfortable communicating with her. Reports frustration when communicating with husband because he fills in automatically vs waiting to process. Pt reports she was using re: delay and picturing word in her head and then trying to produce it. She reports she did not use many strategies purposefully. Provided pt with communication strategies to discuss with family. Briefly reviewed describe strategy. To discuss more next session.   11/16/23: Pt was seen for skilled ST services targeting word finding. SLP observed occasional paraphasias (I.e. 94 for 74; state vs city). She has no instances where she has been completely stuck, but instances where it takes her longer to get there. SLP demonstrated use of TalkPath and assisted pt setting up an account for continued practice over the course of her trip. SLP to recert for two months (01/16/24) for continued work towards strategy implementation.   11/09/23: Pt was seen for skilled ST services targeting word finding. She felt communication went well over her trip and husband reports improvement. She has been using describe and first letter most successfully. SLP facilitated word finding using Semantic Feature Analysis (SFA) and Expanding Expression Tool. Pt required minA-modA to identify features.  Completed session with pt describing basic objects. She required overall minA-modA verbal cues and occasional prompting questions.   10/26/23: Pt was seen for skilled ST services targeting word finding. Pt's husband was unable to join today due to not feeling well. To discuss communication strategies at next visit. Pt reports people will try to fill in the word for her which can make her frustrated because she wants to try to do it independently first. SLP educated on word finding strategies this session re: delay, describe, association, synonym, first letter, gesture, look it up, draw, narrow it down, and come back later. Pt feels like these will be super helpful for her moving forward. SLP provided brief education on Semantic Feature Analysis (SFA) to continue with describe  next session if time allows.   10/19/23: Pt was seen for skilled ST services targeting continued assessment and completion of PROM measures. Pt and husband report re We've gotten used to the missing words/missing phrases.  Pt reported that if she waits long  enough, eventually she will be able to retrieve the word she is looking for. Husband shared that in the last 3 months, pt has been losing some long term memory - not recalling a recipe that she had had multiple times, as well as, not recalling ingredients used in recipe. Husband does report frustration with communication at home and reports this impacts them both. SLP to follow up with communication strategies next session.  Continued assessment for language re: 2-step: 100% Narrative Discourse (Simple) Yes/no questions: 5/8     PATIENT EDUCATION: Education details: SLP role in Cognitive-Communication Person educated: Patient Education method: Explanation Education comprehension: verbalized understanding and needs further education   GOALS: Goals reviewed with patient? No  SHORT TERM GOALS: Target date: 10/17/23  Complete CLQT/language measures, PROM, and update  goals Baseline: Goal status: MET 2. Pt/husband will recall use of communication strategies to support aphasia  Baseline:  Goal status: NOT MET 3. Pt will recall 2+ word finding strategies  Baseline:  Goal status:NOT MET   LONG TERM GOALS: Target date: 11/18/23; 01/16/24  Improve score on PROM Baseline:  Goal status: NOT MET (3 points from where she was @ eval)   2.  Pt/husband will report successful use of communication strategies to support aphasia Baseline:  Goal status: MET  3.  Pt/husband will report successful use of word finding strategies in conversations Baseline:  Goal status: MET    ASSESSMENT:  CLINICAL IMPRESSION: Pt is a 73 yo female who presents to ST OP for evaluation with complaints of cognitive changes. SEE TX NOTE. Pt feels ready for discharge as she feels like she is not having any difficulty at home. Husband has not come to any sessions since evaluation, so there was no input to confirm (as he had scored her lower on PROM @ eval). Pt knows how to return to therapy.    OBJECTIVE IMPAIRMENTS: include attention, memory, expressive language, and receptive language. These impairments are limiting patient from managing medications, managing appointments, managing finances, household responsibilities, ADLs/IADLs, and effectively communicating at home and in community. Factors affecting potential to achieve goals and functional outcome are NA.SABRA Patient will benefit from skilled SLP services to address above impairments and improve overall function.  REHAB POTENTIAL: Good  PLAN:  SLP FREQUENCY: 1x/week  SLP DURATION: 4 weeks (1 MONTH HOLD)  PLANNED INTERVENTIONS: Language facilitation, Environmental controls, Cueing hierachy, Cognitive reorganization, Internal/external aids, Functional tasks, Multimodal communication approach, SLP instruction and feedback, Compensatory strategies, Patient/family education, and [id number] Treatment of speech (30 or 45 min)      [employer], CCC-SLP 01/04/2024, 8:51 AM

## 2024-01-04 NOTE — Progress Notes (Signed)
 Cardiology Office Note:    Date:  01/10/2024   ID:  Stephanie Ramsey, DOB 1950/12/06, MRN [id number]  PCP:  Alvia Bring, DO   [facility] Providers Cardiologist:  None     Referring MD: Alvia Bring, DO   Chief Complaint  Patient presents with   Palpitations    History of Present Illness:    Stephanie Ramsey is a 73 y.o. female is seen at the request of Dr Alvia for evaluation of PVCs. She has a history of HLD, Hypothyroidism, and Ulcerative colitis. She reports that for 2 months she has a sensation that something is going on in her chest and feels pulse is irregular. No real pain. No dyspnea although feels she can't get a deep breath. Went to urgent care and then ED. Troponin was normal. CBC and BMET normal. Ecg did show frequent PVCs with pattern of bigeminy. She notes symptoms more at night. Does use CPAP. Has some chronic memory loss.   Past Medical History:  Diagnosis Date   Aphasia    Arthritis    Cancer (HCC)    right breast   Carpal tunnel syndrome    Chronic back pain    Complication of anesthesia    stopped breathing during procedure and dizziness after procedures   Depression    Hypercholesterolemia    Hypothyroidism    Leg cramps    Osteopenia    Pneumonia    Sleep apnea    Ulcerative colitis (HCC)     Past Surgical History:  Procedure Laterality Date   BILATERAL CARPAL TUNNEL RELEASE     BREAST BIOPSY Right 2011   In Situ DCIS   BREAST LUMPECTOMY Right 2011   in situ    BREAST SURGERY     biopsy and lumpectomy right breast   CHOLECYSTECTOMY     COLONOSCOPY W/ BIOPSIES AND POLYPECTOMY     JOINT REPLACEMENT     Total right hip   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   Procedure: LEFT TOTAL HIP ARTHROPLASTY;  Surgeon: Maude LELON Right, MD;  Location: [facility] OR;  Service: Orthopedics;  Laterality: Left;   TRIGGER FINGER RELEASE      Current Medications: Current Meds  Medication Sig   atorvastatin  (LIPITOR) 20 MG tablet TAKE 1 TABLET BY  MOUTH EVERY DAY   diclofenac  Sodium (VOLTAREN ) 1 % GEL Apply 4 g topically 4 (four) times daily. To affected joint. (Patient taking differently: Apply 4 g topically 4 (four) times daily. To affected joint. PRN)   gabapentin  (NEURONTIN ) 300 MG capsule Take 1 capsule (300 mg total) by mouth daily as needed. (Patient taking differently: Take 500 mg by mouth daily as needed.)   levothyroxine  (SYNTHROID ) 137 MCG tablet TAKE 1 TABLET BY MOUTH EVERY DAY   Magnesium  500 MG CAPS Take by mouth.   memantine  (NAMENDA ) 5 MG tablet Take 1 tablet (5 mg total) by mouth 2 (two) times daily.   Multiple Vitamins-Minerals (MULTIVITAMIN WITH MINERALS) tablet Take 1 tablet by mouth daily.   Omega-3 Fatty Acids (FISH OIL) 1360 MG CAPS Take by mouth.   potassium gluconate 595 (99 K) MG TABS tablet Take 595 mg by mouth.   sulfaSALAzine  (AZULFIDINE ) 500 MG tablet Take 1 tablet (500 mg total) by mouth 3 (three) times daily.     Allergies:   Oxycontin  [oxycodone ], Penicillins, and Propofol    Social History   Socioeconomic History   Marital status: Married    Spouse name: [name]   Number of children: 2  Years of education: 73   Highest education level: Master's degree (e.g., MA, MS, MEng, MEd, MSW, MBA)  Occupational History    Comment: Retired  Tobacco Use   Smoking status: Former    Current packs/day: 0.00    Types: Cigarettes    Quit date: 10/12/1979    Years since quitting: 44.2   Smokeless tobacco: Never   Tobacco comments:    quit smoking cigarettes 32 years ago  Vaping Use   Vaping status: Never Used  Substance and Sexual Activity   Alcohol use: Yes    Alcohol/week: 3.0 standard drinks of alcohol    Types: 3 Glasses of wine per week    Comment: social   Drug use: No   Sexual activity: Not on file  Other Topics Concern   Not on file  Social History Narrative   Lives with her husband. She enjoys playing with her dog and walking.   Social Drivers of Corporate Investment Banker Strain:  Low Risk  (01/06/2023)   Overall Financial Resource Strain (CARDIA)    Difficulty of Paying Living Expenses: Not hard at all  Food Insecurity: No Food Insecurity (01/06/2023)   Hunger Vital Sign    Worried About Running Out of Food in the Last Year: Never true    Ran Out of Food in the Last Year: Never true  Transportation Needs: No Transportation Needs (01/06/2023)   PRAPARE - Administrator, Civil Service (Medical): No    Lack of Transportation (Non-Medical): No  Physical Activity: Sufficiently Active (01/06/2023)   Exercise Vital Sign    Days of Exercise per Week: 5 days    Minutes of Exercise per Session: 120 min  Stress: No Stress Concern Present (01/06/2023)   [employer] of Occupational Health - Occupational Stress Questionnaire    Feeling of Stress : Only a little  Social Connections: Socially Integrated (01/06/2023)   Social Connection and Isolation Panel    Frequency of Communication with Friends and Family: More than three times a week    Frequency of Social Gatherings with Friends and Family: Twice a week    Attends Religious Services: More than 4 times per year    Active Member of [employer] or Organizations: Yes    Attends Engineer, Structural: More than 4 times per year    Marital Status: Married     Family History: The patient's family history includes Arthritis in an other family member; Breast cancer in her mother; Colon cancer in her sister; Heart Problems in her father; Heart disease in her father; Kidney failure in her father; Lung cancer in her brother.  ROS:   Please see the history of present illness.     All other systems reviewed and are negative.  EKGs/Labs/Other Studies Reviewed:    The following studies were reviewed today: EKG Interpretation Date/Time:  Tuesday January 10 2024 10:19:21 EST Ventricular Rate:  58 PR Interval:  96 QRS Duration:  62 QT Interval:  390 QTC Calculation: 382 R Axis:   7  Text  Interpretation: Sinus bradycardia with short PR Nonspecific ST abnormality When compared with ECG of 14-Nov-2023 16:51, No significant change was found Confirmed by Georgann Bramble [phone]) on 01/10/2024 10:37:31 AM  EKG Interpretation Date/Time:  Tuesday January 10 2024 10:19:21 EST Ventricular Rate:  58 PR Interval:  96 QRS Duration:  62 QT Interval:  390 QTC Calculation: 382 R Axis:   7  Text Interpretation: Sinus bradycardia with short PR Nonspecific ST abnormality  When compared with ECG of 14-Nov-2023 16:51, No significant change was found Confirmed by Deryl Giroux [phone]) on 01/10/2024 10:37:31 AM  EKG Interpretation Date/Time:  Tuesday January 10 2024 10:19:21 EST Ventricular Rate:  58 PR Interval:  96 QRS Duration:  62 QT Interval:  390 QTC Calculation: 382 R Axis:   7  Text Interpretation: Sinus bradycardia with short PR Nonspecific ST abnormality When compared with ECG of 14-Nov-2023 16:51, No significant change was found Confirmed by Monserrat Vidaurri [phone]) on 01/10/2024 10:37:31 AM   EKG Interpretation Date/Time:  Tuesday January 10 2024 10:19:21 EST Ventricular Rate:  58 PR Interval:  96 QRS Duration:  62 QT Interval:  390 QTC Calculation: 382 R Axis:   7  Text Interpretation: Sinus bradycardia with short PR Nonspecific ST abnormality When compared with ECG of 14-Nov-2023 16:51, No significant change was found Confirmed by Raihan Kimmel [phone]) on 01/10/2024 10:37:31 AM    Recent Labs: 09/20/2023: TSH 1.830 10/06/2023: ALT 16 11/14/2023: BUN 18; Creatinine, Ser 0.82; Hemoglobin 14.1; Platelets 265; Potassium 4.1; Sodium 138  Recent Lipid Panel    Component Value Date/Time   CHOL 201 (H) 10/17/2020 0000   TRIG 59 10/17/2020 0000   HDL 85 10/17/2020 0000   CHOLHDL 2.4 10/17/2020 0000   LDLCALC 102 (H) 10/17/2020 0000     Risk Assessment/Calculations:        Physical Exam:    VS:  BP (!) 142/70 (BP Location: Left Arm, Patient Position: Sitting, Cuff Size:  Normal)   Pulse (!) 58   Ht 5' 2 (1.575 m)   Wt 141 lb (64 kg)   SpO2 94%   BMI 25.79 kg/m     Wt Readings from Last 3 Encounters:  01/10/24 141 lb (64 kg)  10/13/23 142 lb (64.4 kg)  09/20/23 141 lb 9.6 oz (64.2 kg)     GEN:  Well nourished, well developed in no acute distress HEENT: Normal NECK: No JVD; No carotid bruits LYMPHATICS: No lymphadenopathy CARDIAC: RRR, no murmurs, rubs, gallops RESPIRATORY:  Clear to auscultation without rales, wheezing or rhonchi  ABDOMEN: Soft, non-tender, non-distended MUSCULOSKELETAL:  No edema; No deformity  SKIN: Warm and dry NEUROLOGIC:  Alert and oriented x 3 PSYCHIATRIC:  Normal affect   ASSESSMENT:    1. Premature ventricular contraction   2. Palpitations   3. Hypothyroidism, unspecified type    PLAN:    In order of problems listed above:  PVCs symptomatic. Recommend Echo and 2 week event monitor to quantitate and to assess risk. Avoid caffeine. I did review chest CT in 2022 and no coronary calcification so I think ischemic risk is low. Her resting HR is slow so she would likely not be a candidate for beta blocker.            Medication Adjustments/Labs and Tests Ordered: Current medicines are reviewed at length with the patient today.  Concerns regarding medicines are outlined above.  Orders Placed This Encounter  Procedures   LONG TERM MONITOR (3-14 DAYS)   EKG 12-Lead   ECHOCARDIOGRAM COMPLETE   No orders of the defined types were placed in this encounter.   Patient Instructions  Medication Instructions:  Continue same medications  Lab Work: None ordered  Testing/Procedures: Echo  first available  2 week heart monitor   will be mailed to your home with instructions   Follow-Up: At [facility], you and your health needs are our priority.  As part of our continuing mission to provide you with exceptional  heart care, our providers are all part of one team.  This team includes your primary  Cardiologist (physician) and Advanced Practice Providers or APPs (Physician Assistants and Nurse Practitioners) who all work together to provide you with the care you need, when you need it.  Your next appointment:  After Test     Provider:  Dr.Duron Meister    We recommend signing up for the patient portal called MyChart.  Sign up information is provided on this After Visit Summary.  MyChart is used to connect with patients for Virtual Visits (Telemedicine).  Patients are able to view lab/test results, encounter notes, upcoming appointments, etc.  Non-urgent messages can be sent to your provider as well.   To learn more about what you can do with MyChart, go to [email or web address].     Signed, Reshanda Lewey, MD  01/10/2024 10:40 AM    [facility]

## 2024-01-05 DIAGNOSIS — K080 Exfoliation of teeth due to systemic causes: Secondary | ICD-10-CM | POA: Diagnosis not present

## 2024-01-10 ENCOUNTER — Ambulatory Visit: Attending: Cardiology | Admitting: Cardiology

## 2024-01-10 ENCOUNTER — Encounter: Payer: Self-pay | Admitting: Cardiology

## 2024-01-10 ENCOUNTER — Ambulatory Visit

## 2024-01-10 VITALS — BP 142/70 | HR 58 | Ht 62.0 in | Wt 141.0 lb

## 2024-01-10 DIAGNOSIS — R002 Palpitations: Secondary | ICD-10-CM

## 2024-01-10 DIAGNOSIS — E039 Hypothyroidism, unspecified: Secondary | ICD-10-CM

## 2024-01-10 DIAGNOSIS — I493 Ventricular premature depolarization: Secondary | ICD-10-CM | POA: Diagnosis not present

## 2024-01-10 NOTE — Progress Notes (Unsigned)
 Enrolled patient for a 124 day Zio XT monitor to be mailed to patients home

## 2024-01-10 NOTE — Patient Instructions (Addendum)
 Medication Instructions:  Continue same medications  Lab Work: None ordered  Testing/Procedures: Echo  first available  2 week heart monitor   will be mailed to your home with instructions   Follow-Up: At [facility], you and your health needs are our priority.  As part of our continuing mission to provide you with exceptional heart care, our providers are all part of one team.  This team includes your primary Cardiologist (physician) and Advanced Practice Providers or APPs (Physician Assistants and Nurse Practitioners) who all work together to provide you with the care you need, when you need it.  Your next appointment:  After Test     Provider:  Dr.Jordan    We recommend signing up for the patient portal called MyChart.  Sign up information is provided on this After Visit Summary.  MyChart is used to connect with patients for Virtual Visits (Telemedicine).  Patients are able to view lab/test results, encounter notes, upcoming appointments, etc.  Non-urgent messages can be sent to your provider as well.   To learn more about what you can do with MyChart, go to [email or web address].

## 2024-01-16 ENCOUNTER — Other Ambulatory Visit: Payer: Self-pay | Admitting: Cardiology

## 2024-01-16 DIAGNOSIS — E039 Hypothyroidism, unspecified: Secondary | ICD-10-CM

## 2024-01-16 DIAGNOSIS — R002 Palpitations: Secondary | ICD-10-CM

## 2024-01-16 DIAGNOSIS — I493 Ventricular premature depolarization: Secondary | ICD-10-CM

## 2024-02-02 ENCOUNTER — Ambulatory Visit (HOSPITAL_BASED_OUTPATIENT_CLINIC_OR_DEPARTMENT_OTHER): Admission: RE | Admit: 2024-02-02

## 2024-02-02 DIAGNOSIS — R9431 Abnormal electrocardiogram [ECG] [EKG]: Secondary | ICD-10-CM | POA: Insufficient documentation

## 2024-02-02 DIAGNOSIS — Z87891 Personal history of nicotine dependence: Secondary | ICD-10-CM | POA: Diagnosis not present

## 2024-02-02 DIAGNOSIS — I493 Ventricular premature depolarization: Secondary | ICD-10-CM | POA: Diagnosis present

## 2024-02-02 DIAGNOSIS — I361 Nonrheumatic tricuspid (valve) insufficiency: Secondary | ICD-10-CM | POA: Diagnosis not present

## 2024-02-02 DIAGNOSIS — E785 Hyperlipidemia, unspecified: Secondary | ICD-10-CM | POA: Diagnosis not present

## 2024-02-02 DIAGNOSIS — R002 Palpitations: Secondary | ICD-10-CM | POA: Diagnosis not present

## 2024-02-02 DIAGNOSIS — L03116 Cellulitis of left lower limb: Secondary | ICD-10-CM | POA: Diagnosis not present

## 2024-02-02 LAB — ECHOCARDIOGRAM COMPLETE
AR max vel: 1.94 cm2
AV Area VTI: 2.05 cm2
AV Area mean vel: 1.74 cm2
AV Mean grad: 2.5 mmHg
AV Peak grad: 4.7 mmHg
Ao pk vel: 1.09 m/s
Area-P 1/2: 2.66 cm2
Calc EF: 66.7 %
MV M vel: 3.12 m/s
MV Peak grad: 38.9 mmHg
S' Lateral: 2.4 cm
Single Plane A2C EF: 64.8 %
Single Plane A4C EF: 69.9 %

## 2024-02-03 ENCOUNTER — Ambulatory Visit: Payer: Self-pay | Admitting: Cardiology

## 2024-02-03 DIAGNOSIS — I493 Ventricular premature depolarization: Secondary | ICD-10-CM | POA: Diagnosis not present

## 2024-02-03 DIAGNOSIS — R002 Palpitations: Secondary | ICD-10-CM | POA: Diagnosis not present

## 2024-02-10 ENCOUNTER — Ambulatory Visit: Payer: Self-pay | Admitting: Cardiology

## 2024-02-10 DIAGNOSIS — I493 Ventricular premature depolarization: Secondary | ICD-10-CM | POA: Diagnosis not present

## 2024-02-10 DIAGNOSIS — R002 Palpitations: Secondary | ICD-10-CM

## 2024-02-14 MED ORDER — METOPROLOL SUCCINATE ER 25 MG PO TB24: 25.0000 mg | ORAL_TABLET | Freq: Every day | ORAL | 4 refills | Status: AC

## 2024-02-14 NOTE — Telephone Encounter (Signed)
 Per DPR , left detail  message on voicemail.  Result of Monitor  and starting medication Toprol Xl 25 mg  daily   Appointment schedule for 03/14/24 at 2:45 pm with LOIS Louder PA.  If unable to make appointment , call back to reschedule   Result routed to PCP Dr Alvia

## 2024-02-14 NOTE — Addendum Note (Signed)
 Addended by: GLADIS REENA GAILS on: 02/14/2024 12:50 PM   Modules accepted: Orders

## 2024-02-14 NOTE — Telephone Encounter (Signed)
-----   Message from Peter Jordan, MD sent at 02/10/2024  8:11 AM EST ----- This study demonstrates:  event monitor shows NSR. Few brief runs of SVT. 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  Medication changes / Follow up studies / Other recommendations:   We could try her on a low dose of beta blocker to see if this helps with her symptoms. Start Toprol XL 25 mg daily. Would arrange follow up with APP in 4-6 weeks  Please send results to the PCP:  Alvia Bring, DO  Peter Jordan, MD 02/10/2024 8:09 AM

## 2024-02-15 NOTE — Telephone Encounter (Signed)
 Pt husband calling to get a  better understanding f the new medication she is to start. Please advise.

## 2024-02-25 ENCOUNTER — Other Ambulatory Visit: Payer: Self-pay | Admitting: Family Medicine

## 2024-03-04 NOTE — Progress Notes (Signed)
 " Cardiology Office Note   Date:  03/14/2024  ID:  Stephanie Ramsey, DOB 17-May-1950, MRN [id number] PCP: Alvia Bring, DO  [facility] Providers Cardiologist:  None     History of Present Illness Stephanie Ramsey is a 74 y.o. female with a past medical history of PVCs, HLD, hypothyroidism, ulcerative colitis. She is followed by Dr. Jordan and presents today for evaluation of PVCs   Patient was referred to Dr. Jordan and seen 01/10/24 for evaluation of PVCs. Described palpitations and having an irregular heart rate. Echocardiogram 02/02/24 showed EF 55-60%, no wall motion abnormalities, normal RV systolic function, no significant valvular abnormalities. Cardiac monitor showed NSR with 11 episodes of SVT (longest 15 beats). There were frequent PVCs with a 5.2% burden. Started on metoprolol    Today, patient presents for follow-up appointment after starting metoprolol .  Tells me that before starting metoprolol , she had episodes of palpitations when exerting herself during the day and at night when laying in bed.  Since starting the metoprolol , her palpitations have significantly improved.  She no longer has any palpitations at night.  If she just walks on flat ground or up the stairs she does not have any palpitations.  If she runs up the stairs, she will have a few palpitations that resolve quickly with rest.  She has not had any dizziness, lightheadedness, syncope, near syncope.  No chest pain or shortness of breath.  Studies Reviewed Cardiac Studies & Procedures   ______________________________________________________________________________________________     ECHOCARDIOGRAM  ECHOCARDIOGRAM COMPLETE 02/02/2024  Narrative ECHOCARDIOGRAM REPORT    Patient Name:   Stephanie Ramsey [place] Date of Exam: 02/02/2024 Medical Rec #:  [id number]   Height:       62.0 in Accession #:    [id number]  Weight:       141.0 lb Date of Birth:  Jun 29, 1950  BSA:          1.648 m Patient Age:    73 years    BP:            142/70 mmHg Patient Gender: F           HR:           53 bpm. Exam Location:  [place]  Procedure: 2D Echo, 3D Echo, Cardiac Doppler, Color Doppler and Strain Analysis (Both Spectral and Color Flow Doppler were utilized during procedure).  Indications:    R00.2 Palpitations; I49.3 PVC  History:        Patient has no prior history of Echocardiogram examinations. Arrythmias:PVC and Palpitations; Risk Factors:Dyslipidemia and Former Smoker.  Sonographer:    Alan Greenhouse RDMS, RVT, RDCS Referring Phys: [phone] PETER M JORDAN  IMPRESSIONS   1. Left ventricular ejection fraction, by estimation, is 55 to 60%. Left ventricular ejection fraction by 3D volume is 56 %. The left ventricle has normal function. The left ventricle has no regional wall motion abnormalities. Left ventricular diastolic parameters were normal. The average left ventricular global longitudinal strain is -21.5 %. The global longitudinal strain is normal. 2. Right ventricular systolic function is normal. The right ventricular size is normal. 3. The mitral valve is normal in structure. No evidence of mitral valve regurgitation. No evidence of mitral stenosis. 4. The aortic valve is tricuspid. Aortic valve regurgitation is not visualized. No aortic stenosis is present. 5. The inferior vena cava is normal in size with greater than 50% respiratory variability, suggesting right atrial pressure of 3 mmHg.  FINDINGS Left Ventricle: Left ventricular ejection fraction,  by estimation, is 55 to 60%. Left ventricular ejection fraction by 3D volume is 56 %. The left ventricle has normal function. The left ventricle has no regional wall motion abnormalities. The average left ventricular global longitudinal strain is -21.5 %. Strain was performed and the global longitudinal strain is normal. The left ventricular internal cavity size was normal in size. There is no left ventricular hypertrophy. Left ventricular diastolic parameters  were normal. Normal left ventricular filling pressure.  Right Ventricle: The right ventricular size is normal. No increase in right ventricular wall thickness. Right ventricular systolic function is normal.  Left Atrium: Left atrial size was normal in size.  Right Atrium: Right atrial size was normal in size.  Pericardium: There is no evidence of pericardial effusion.  Mitral Valve: The mitral valve is normal in structure. No evidence of mitral valve regurgitation. No evidence of mitral valve stenosis.  Tricuspid Valve: The tricuspid valve is normal in structure. Tricuspid valve regurgitation is mild . No evidence of tricuspid stenosis.  Aortic Valve: The aortic valve is tricuspid. Aortic valve regurgitation is not visualized. No aortic stenosis is present. Aortic valve mean gradient measures 2.5 mmHg. Aortic valve peak gradient measures 4.7 mmHg. Aortic valve area, by VTI measures 2.05 cm.  Pulmonic Valve: The pulmonic valve was normal in structure. Pulmonic valve regurgitation is mild. No evidence of pulmonic stenosis.  Aorta: The aortic arch was not well visualized and the aortic root and ascending aorta are structurally normal, with no evidence of dilitation.  Venous: The right upper pulmonary vein is normal. The inferior vena cava is normal in size with greater than 50% respiratory variability, suggesting right atrial pressure of 3 mmHg.  IAS/Shunts: No atrial level shunt detected by color flow Doppler.  Additional Comments: 3D was performed not requiring image post processing on an independent workstation and was normal.   LEFT VENTRICLE PLAX 2D LVIDd:         4.20 cm         Diastology LVIDs:         2.40 cm         LV e' medial:    6.85 cm/s LV PW:         0.90 cm         LV E/e' medial:  10.7 LV IVS:        1.00 cm         LV e' lateral:   7.51 cm/s LVOT diam:     1.70 cm         LV E/e' lateral: 9.7 LV SV:         52 LV SV Index:   31              2D Longitudinal LVOT  Area:     2.27 cm        Strain 2D Strain GLS   -22.0 % (A4C): LV Volumes (MOD)               2D Strain GLS   -20.3 % LV vol d, MOD    50.3 ml       (A3C): A2C:                           2D Strain GLS   -21.3 % LV vol d, MOD    65.8 ml       (A2C): A4C:  2D Strain GLS   -21.5 % LV vol s, MOD    17.7 ml       Avg: A2C: LV vol s, MOD    19.8 ml       3D Volume EF A4C:                           LV 3D EF:    Left LV SV MOD A2C:   32.6 ml                    ventricul LV SV MOD A4C:   65.8 ml                    ar LV SV MOD BP:    38.7 ml                    ejection fraction by 3D volume is 56 %.  3D Volume EF: 3D EF:        56 % LV EDV:       111 ml LV ESV:       49 ml LV SV:        62 ml  RIGHT VENTRICLE RV S prime:     10.80 cm/s  PULMONARY VEINS TAPSE (M-mode): 1.7 cm      Diastolic Velocity: 34.70 cm/s S/D Velocity:       1.50 Systolic Velocity:  51.80 cm/s  LEFT ATRIUM             Index        RIGHT ATRIUM           Index LA diam:        2.80 cm 1.70 cm/m   RA Area:     11.70 cm LA Vol (A2C):   24.6 ml 14.93 ml/m  RA Volume:   26.70 ml  16.20 ml/m LA Vol (A4C):   27.1 ml 16.45 ml/m LA Biplane Vol: 26.5 ml 16.08 ml/m AORTIC VALVE AV Area (Vmax):    1.94 cm AV Area (Vmean):   1.74 cm AV Area (VTI):     2.05 cm AV Vmax:           108.50 cm/s AV Vmean:          79.850 cm/s AV VTI:            0.251 m AV Peak Grad:      4.7 mmHg AV Mean Grad:      2.5 mmHg LVOT Vmax:         92.80 cm/s LVOT Vmean:        61.100 cm/s LVOT VTI:          0.227 m LVOT/AV VTI ratio: 0.90  AORTA Ao Root diam: 2.85 cm Ao Asc diam:  2.60 cm  MITRAL VALVE               TRICUSPID VALVE MV Area (PHT): 2.66 cm    TR Peak grad:   23.0 mmHg MV Decel Time: 286 msec    TR Vmax:        240.00 cm/s MR Peak grad: 38.9 mmHg MR Vmax:      312.00 cm/s  SHUNTS MV E velocity: 73.10 cm/s  Systemic VTI:  0.23 m MV A velocity: 82.30 cm/s  Systemic Diam: 1.70 cm MV E/A  ratio:  0.89  Redell Leiter MD Electronically signed by Redell Leiter MD Signature Date/Time: 02/02/2024/8:18:33  PM    Final    MONITORS  LONG TERM MONITOR (3-14 DAYS) 02/06/2024  Narrative   Normal sinus rhythm   11 nonsustained runs of SVT- longest 15 beats   Frequent PV   Patch Wear Time:  14 days and 0 hours (2025-11-30T20:00:30-0500 to 2025-12-14T20:00:30-0500)  Patient had a min HR of 42 bpm, max HR of 176 bpm, and avg HR of 75 bpm. Predominant underlying rhythm was Sinus Rhythm. 11 Supraventricular Tachycardia runs occurred, the run with the fastest interval lasting 8 beats with a max rate of 176 bpm, the longest lasting 15 beats with an avg rate of 109 bpm. Isolated SVEs were rare (<1.0%), SVE Couplets were rare (<1.0%), and SVE Triplets were rare (<1.0%). Isolated VEs were frequent (5.2%, [id number]), VE Couplets were rare (<1.0%, 5251), and VE Triplets were rare (<1.0%, 63). Ventricular Bigeminy and Trigeminy were present.       ______________________________________________________________________________________________       Risk Assessment/Calculations           Physical Exam VS:  BP 120/74   Pulse 80   Ht 5' 2 (1.575 m)   Wt 143 lb (64.9 kg)   SpO2 94%   BMI 26.16 kg/m        Wt Readings from Last 3 Encounters:  03/14/24 143 lb (64.9 kg)  01/10/24 141 lb (64 kg)  10/13/23 142 lb (64.4 kg)    GEN: Well nourished, well developed in no acute distress.  Sitting comfortably on the exam table NECK: No JVD; No carotid bruits CARDIAC: RRR, no murmurs, rubs, gallops RESPIRATORY:  Clear to auscultation without rales, wheezing or rhonchi.  Normal work of breathing on room air ABDOMEN: Soft, non-tender, non-distended EXTREMITIES:  No edema in bilateral lower extremities; No deformity   ASSESSMENT AND PLAN  PVCs  - Cardiac monitor in 01/2024 showed 5.2% PVC burden - Echocardiogram from 01/2024 showed EF 55-60%, no wall motion abnormalities, normal RV  systolic function, no significant valvular abnormalities -Thyroid  function normal in 09/2023 - Patient recently started on metoprolol  succinate 25 mg daily.  Has had significant improvement since starting this medication and is tolerating medication well - Reviewed triggers for PVCs including caffeine, dehydration, exercise, stress. - Continue metoprolol  succinate 25 mg daily  HLD - Labs followed by PCP  - continue lipitor 20 mg daily    Dispo: Follow-up in 1 year with me  Signed, Rollo FABIENE Louder, PA-C   "

## 2024-03-11 ENCOUNTER — Other Ambulatory Visit: Payer: Self-pay | Admitting: Family Medicine

## 2024-03-14 ENCOUNTER — Encounter: Payer: Self-pay | Admitting: Cardiology

## 2024-03-14 ENCOUNTER — Ambulatory Visit: Attending: Cardiovascular Disease | Admitting: Cardiology

## 2024-03-14 VITALS — BP 120/74 | HR 80 | Ht 62.0 in | Wt 143.0 lb

## 2024-03-14 DIAGNOSIS — I493 Ventricular premature depolarization: Secondary | ICD-10-CM | POA: Diagnosis not present

## 2024-03-14 DIAGNOSIS — E785 Hyperlipidemia, unspecified: Secondary | ICD-10-CM | POA: Diagnosis not present

## 2024-03-14 NOTE — Patient Instructions (Signed)
 Medication Instructions:  Your physician recommends that you continue on your current medications as directed. Please refer to the Current Medication list given to you today.  *If you need a refill on your cardiac medications before your next appointment, please call your pharmacy*  Lab Work: None orderedd  If you have labs (blood work) drawn today and your tests are completely normal, you will receive your results only by: MyChart Message (if you have MyChart) OR A paper copy in the mail If you have any lab test that is abnormal or we need to change your treatment, we will call you to review the results.  Testing/Procedures: None ordered  Follow-Up: At [facility], you and your health needs are our priority.  As part of our continuing mission to provide you with exceptional heart care, our providers are all part of one team.  This team includes your primary Cardiologist (physician) and Advanced Practice Providers or APPs (Physician Assistants and Nurse Practitioners) who all work together to provide you with the care you need, when you need it.  Your next appointment:   1 year(s)  Provider:   Rollo Louder, PA-C          We recommend signing up for the patient portal called MyChart.  Sign up information is provided on this After Visit Summary.  MyChart is used to connect with patients for Virtual Visits (Telemedicine).  Patients are able to view lab/test results, encounter notes, upcoming appointments, etc.  Non-urgent messages can be sent to your provider as well.   To learn more about what you can do with MyChart, go to [email or web address].   Other Instructions

## 2024-04-10 ENCOUNTER — Ambulatory Visit: Admitting: Adult Health
